# Patient Record
Sex: Female | Born: 1935 | Race: White | Hispanic: No | Smoking: Former smoker
Health system: Southern US, Community
[De-identification: ages and names within clinical notes are randomized; demographics above are authoritative.]

## PROBLEM LIST (undated history)

## (undated) DIAGNOSIS — I480 Paroxysmal atrial fibrillation: Secondary | ICD-10-CM

## (undated) DIAGNOSIS — H3530 Unspecified macular degeneration: Secondary | ICD-10-CM

## (undated) DIAGNOSIS — Z79899 Other long term (current) drug therapy: Secondary | ICD-10-CM

## (undated) DIAGNOSIS — F32A Depression, unspecified: Secondary | ICD-10-CM

## (undated) DIAGNOSIS — F419 Anxiety disorder, unspecified: Secondary | ICD-10-CM

## (undated) DIAGNOSIS — E785 Hyperlipidemia, unspecified: Secondary | ICD-10-CM

## (undated) DIAGNOSIS — I639 Cerebral infarction, unspecified: Secondary | ICD-10-CM

## (undated) DIAGNOSIS — I10 Essential (primary) hypertension: Secondary | ICD-10-CM

## (undated) DIAGNOSIS — D649 Anemia, unspecified: Secondary | ICD-10-CM

## (undated) DIAGNOSIS — E039 Hypothyroidism, unspecified: Secondary | ICD-10-CM

## (undated) DIAGNOSIS — I2510 Atherosclerotic heart disease of native coronary artery without angina pectoris: Secondary | ICD-10-CM

## (undated) DIAGNOSIS — F329 Major depressive disorder, single episode, unspecified: Secondary | ICD-10-CM

## (undated) DIAGNOSIS — I5042 Chronic combined systolic (congestive) and diastolic (congestive) heart failure: Secondary | ICD-10-CM

## (undated) HISTORY — DX: Hyperlipidemia, unspecified: E78.5

## (undated) HISTORY — DX: Cerebral infarction, unspecified: I63.9

## (undated) HISTORY — PX: CORONARY ARTERY BYPASS GRAFT: SHX141

## (undated) HISTORY — DX: Essential (primary) hypertension: I10

## (undated) HISTORY — DX: Anxiety disorder, unspecified: F41.9

## (undated) HISTORY — DX: Depression, unspecified: F32.A

## (undated) HISTORY — PX: CARDIAC SURGERY: SHX584

## (undated) HISTORY — DX: Atherosclerotic heart disease of native coronary artery without angina pectoris: I25.10

## (undated) HISTORY — DX: Anemia, unspecified: D64.9

## (undated) HISTORY — DX: Chronic combined systolic (congestive) and diastolic (congestive) heart failure: I50.42

## (undated) HISTORY — PX: ABDOMINAL HYSTERECTOMY: SHX81

## (undated) HISTORY — DX: Other long term (current) drug therapy: Z79.899

## (undated) HISTORY — DX: Major depressive disorder, single episode, unspecified: F32.9

## (undated) HISTORY — DX: Paroxysmal atrial fibrillation: I48.0

---

## 1999-02-16 ENCOUNTER — Encounter: Payer: Self-pay | Admitting: Cardiology

## 1999-02-16 ENCOUNTER — Inpatient Hospital Stay (HOSPITAL_COMMUNITY): Admission: EM | Admit: 1999-02-16 | Discharge: 1999-02-21 | Payer: Self-pay | Admitting: Cardiology

## 1999-02-17 ENCOUNTER — Encounter: Payer: Self-pay | Admitting: Cardiology

## 1999-02-19 ENCOUNTER — Encounter: Payer: Self-pay | Admitting: Cardiology

## 2000-05-12 ENCOUNTER — Encounter: Payer: Self-pay | Admitting: Cardiology

## 2000-05-12 ENCOUNTER — Inpatient Hospital Stay (HOSPITAL_COMMUNITY): Admission: EM | Admit: 2000-05-12 | Discharge: 2000-05-14 | Payer: Self-pay | Admitting: Cardiology

## 2002-06-12 ENCOUNTER — Encounter: Payer: Self-pay | Admitting: Emergency Medicine

## 2002-06-12 ENCOUNTER — Inpatient Hospital Stay (HOSPITAL_COMMUNITY): Admission: EM | Admit: 2002-06-12 | Discharge: 2002-06-14 | Payer: Self-pay | Admitting: Emergency Medicine

## 2004-09-29 ENCOUNTER — Ambulatory Visit: Payer: Self-pay | Admitting: Cardiology

## 2004-09-30 ENCOUNTER — Ambulatory Visit: Payer: Self-pay | Admitting: Cardiology

## 2006-07-29 ENCOUNTER — Inpatient Hospital Stay (HOSPITAL_COMMUNITY): Admission: RE | Admit: 2006-07-29 | Discharge: 2006-08-15 | Payer: Self-pay | Admitting: Cardiology

## 2006-07-29 ENCOUNTER — Encounter: Payer: Self-pay | Admitting: Vascular Surgery

## 2007-07-12 ENCOUNTER — Inpatient Hospital Stay (HOSPITAL_COMMUNITY): Admission: EM | Admit: 2007-07-12 | Discharge: 2007-07-13 | Payer: Self-pay | Admitting: Emergency Medicine

## 2009-07-08 ENCOUNTER — Ambulatory Visit: Payer: Self-pay | Admitting: Cardiology

## 2010-09-01 ENCOUNTER — Ambulatory Visit: Payer: Self-pay | Admitting: Cardiology

## 2010-12-05 ENCOUNTER — Ambulatory Visit (INDEPENDENT_AMBULATORY_CARE_PROVIDER_SITE_OTHER): Payer: Medicare Other | Admitting: Cardiology

## 2010-12-05 DIAGNOSIS — I252 Old myocardial infarction: Secondary | ICD-10-CM

## 2010-12-05 DIAGNOSIS — E789 Disorder of lipoprotein metabolism, unspecified: Secondary | ICD-10-CM

## 2010-12-05 DIAGNOSIS — R42 Dizziness and giddiness: Secondary | ICD-10-CM

## 2010-12-25 ENCOUNTER — Emergency Department (HOSPITAL_COMMUNITY): Payer: Medicare Other

## 2010-12-25 ENCOUNTER — Inpatient Hospital Stay (HOSPITAL_COMMUNITY)
Admission: EM | Admit: 2010-12-25 | Discharge: 2010-12-31 | DRG: 308 | Disposition: A | Discharge status: Home Health | Payer: Medicare Other | Attending: Internal Medicine | Admitting: Internal Medicine

## 2010-12-25 DIAGNOSIS — I248 Other forms of acute ischemic heart disease: Secondary | ICD-10-CM | POA: Diagnosis present

## 2010-12-25 DIAGNOSIS — R4182 Altered mental status, unspecified: Secondary | ICD-10-CM

## 2010-12-25 DIAGNOSIS — I10 Essential (primary) hypertension: Secondary | ICD-10-CM | POA: Diagnosis present

## 2010-12-25 DIAGNOSIS — I2489 Other forms of acute ischemic heart disease: Secondary | ICD-10-CM | POA: Diagnosis present

## 2010-12-25 DIAGNOSIS — Z7982 Long term (current) use of aspirin: Secondary | ICD-10-CM

## 2010-12-25 DIAGNOSIS — I2510 Atherosclerotic heart disease of native coronary artery without angina pectoris: Secondary | ICD-10-CM | POA: Diagnosis present

## 2010-12-25 DIAGNOSIS — I4891 Unspecified atrial fibrillation: Secondary | ICD-10-CM

## 2010-12-25 DIAGNOSIS — F0390 Unspecified dementia without behavioral disturbance: Secondary | ICD-10-CM | POA: Diagnosis present

## 2010-12-25 DIAGNOSIS — I959 Hypotension, unspecified: Secondary | ICD-10-CM | POA: Diagnosis present

## 2010-12-25 DIAGNOSIS — Z951 Presence of aortocoronary bypass graft: Secondary | ICD-10-CM

## 2010-12-25 DIAGNOSIS — E785 Hyperlipidemia, unspecified: Secondary | ICD-10-CM | POA: Diagnosis present

## 2010-12-25 DIAGNOSIS — Z23 Encounter for immunization: Secondary | ICD-10-CM

## 2010-12-25 DIAGNOSIS — J189 Pneumonia, unspecified organism: Secondary | ICD-10-CM | POA: Diagnosis present

## 2010-12-25 DIAGNOSIS — R197 Diarrhea, unspecified: Secondary | ICD-10-CM | POA: Diagnosis present

## 2010-12-25 DIAGNOSIS — R404 Transient alteration of awareness: Secondary | ICD-10-CM | POA: Diagnosis present

## 2010-12-25 DIAGNOSIS — F411 Generalized anxiety disorder: Secondary | ICD-10-CM | POA: Diagnosis present

## 2010-12-25 DIAGNOSIS — F329 Major depressive disorder, single episode, unspecified: Secondary | ICD-10-CM | POA: Diagnosis present

## 2010-12-25 DIAGNOSIS — F3289 Other specified depressive episodes: Secondary | ICD-10-CM | POA: Diagnosis present

## 2010-12-25 LAB — CBC
MCHC: 32.4 g/dL (ref 30.0–36.0)
MCV: 86.8 fL (ref 78.0–100.0)
Platelets: 167 10*3/uL (ref 150–400)
RDW: 14.1 % (ref 11.5–15.5)
WBC: 14.3 10*3/uL — ABNORMAL HIGH (ref 4.0–10.5)

## 2010-12-25 LAB — TROPONIN I: Troponin I: 0.06 ng/mL (ref 0.00–0.06)

## 2010-12-25 LAB — MAGNESIUM: Magnesium: 2 mg/dL (ref 1.5–2.5)

## 2010-12-25 LAB — POCT CARDIAC MARKERS
CKMB, poc: 1.4 ng/mL (ref 1.0–8.0)
Troponin i, poc: <0.05 ng/mL (ref 0.00–0.09)

## 2010-12-25 LAB — URINALYSIS, ROUTINE W REFLEX MICROSCOPIC
Bilirubin Urine: NEGATIVE
Ketones, ur: NEGATIVE mg/dL
Protein, ur: NEGATIVE mg/dL
Urine Glucose, Fasting: NEGATIVE mg/dL

## 2010-12-25 LAB — BRAIN NATRIURETIC PEPTIDE: Pro B Natriuretic peptide (BNP): 275 pg/mL — ABNORMAL HIGH (ref 0.0–100.0)

## 2010-12-25 LAB — DIFFERENTIAL
Basophils Absolute: 0 10*3/uL (ref 0.0–0.1)
Eosinophils Absolute: 0.1 10*3/uL (ref 0.0–0.7)
Eosinophils Relative: 1 % (ref 0–5)

## 2010-12-25 LAB — POCT I-STAT, CHEM 8
Chloride: 107 mEq/L (ref 96–112)
HCT: 38 % (ref 36.0–46.0)
Potassium: 4.2 mEq/L (ref 3.5–5.1)
Sodium: 140 mEq/L (ref 135–145)

## 2010-12-25 LAB — GLUCOSE, CAPILLARY: Glucose-Capillary: 142 mg/dL — ABNORMAL HIGH (ref 70–99)

## 2010-12-25 LAB — PROTIME-INR: INR: 0.94 (ref 0.00–1.49)

## 2010-12-26 DIAGNOSIS — I4891 Unspecified atrial fibrillation: Secondary | ICD-10-CM

## 2010-12-26 LAB — COMPREHENSIVE METABOLIC PANEL
ALT: 27 U/L (ref 0–35)
Alkaline Phosphatase: 70 U/L (ref 39–117)
BUN: 15 mg/dL (ref 6–23)
CO2: 25 mEq/L (ref 19–32)
GFR calc non Af Amer: >60 mL/min (ref >60)
Glucose, Bld: 95 mg/dL (ref 70–99)
Potassium: 3.8 mEq/L (ref 3.5–5.1)
Total Bilirubin: 0.7 mg/dL (ref 0.3–1.2)
Total Protein: 5.1 g/dL — ABNORMAL LOW (ref 6.0–8.3)

## 2010-12-26 LAB — CBC
MCV: 87.2 fL (ref 78.0–100.0)
Platelets: 148 10*3/uL — ABNORMAL LOW (ref 150–400)
RBC: 3.97 MIL/uL (ref 3.87–5.11)
RDW: 14.1 % (ref 11.5–15.5)
WBC: 8.8 10*3/uL (ref 4.0–10.5)

## 2010-12-26 LAB — URINE CULTURE
Colony Count: NO GROWTH
Culture  Setup Time: 201202232018
Culture: NO GROWTH

## 2010-12-26 LAB — DIFFERENTIAL
Basophils Absolute: 0 10*3/uL (ref 0.0–0.1)
Basophils Relative: 0 % (ref 0–1)
Eosinophils Absolute: 0.1 10*3/uL (ref 0.0–0.7)
Lymphs Abs: 1.6 10*3/uL (ref 0.7–4.0)
Neutrophils Relative %: 75 % (ref 43–77)

## 2010-12-26 LAB — LIPID PANEL
HDL: 34 mg/dL — ABNORMAL LOW (ref >39)
LDL Cholesterol: 75 mg/dL (ref 0–99)
Total CHOL/HDL Ratio: 3.6 RATIO
VLDL: 15 mg/dL (ref 0–40)

## 2010-12-26 LAB — TSH: TSH: 0.953 u[IU]/mL (ref 0.350–4.500)

## 2010-12-26 LAB — CARDIAC PANEL(CRET KIN+CKTOT+MB+TROPI)
Relative Index: INVALID (ref 0.0–2.5)
Total CK: 79 U/L (ref 7–177)

## 2010-12-26 LAB — LACTIC ACID, PLASMA: Lactic Acid, Venous: 0.7 mmol/L (ref 0.5–2.2)

## 2010-12-26 LAB — PROCALCITONIN: Procalcitonin: <0.1 ng/mL

## 2010-12-26 NOTE — Consult Note (Signed)
[name], STATZER NO.:  [id number]  MEDICAL RECORD NO.:  [id number]           PATIENT TYPE:  I  LOCATION:  4736                         FACILITY:  [facility]  PHYSICIAN:  Harlon Flor, MD   DATE OF BIRTH:  [birth year]  DATE OF CONSULTATION: DATE OF DISCHARGE:                                CONSULTATION   REQUESTING PHYSICIAN:  Dr. Rosetta Posner.  PRIMARY CARDIOLOGIST:  Cassell Clement, MD  REASON FOR CONSULTATION:  Atrial fibrillation.  CHIEF COMPLAINT:  Altered mental status.  HISTORY OF PRESENT ILLNESS:  [name] is an 77 year old white female with history of coronary disease who was brought to the hospital via EMS today with altered mental status and diarrhea and atrial fibrillation. She is confused and unable to give much history.  I have attempted to contact family but have been unsuccessful.  She states that she lives at home alone and that she is the one that called 911, but she cannot tell me the date or where she lives.  It appears on arrival she was in AFib with RVR that improved with IV diltiazem and is now rate controlled. She was somewhat hypotensive that improved with fluids.  She was found to have a lingular infiltrate on chest x-ray.  She has not had chest pain as far as I can tell, and her cardiac enzymes were negative.  She does have a history of postop atrial fibrillation after her CABG and was treated for short time with amiodarone and it appears this resolved and has not required subsequent treatment.  It is not clear how long she had been having diarrhea, but she has had a few episodes while in the emergency room.  PAST MEDICAL HISTORY: 1. Coronary artery disease:  Bypass surgery in 1999 with a redo bypass     surgery in 2007 per Dr. Laneta Simmers with vein graft to the OM and vein     graft to the distal RCA and vein graft to the right coronary     marginal branch. 2. History of postop AFib in 2007, treated for short term with  amiodarone. 3. Hypertension. 4. Hyperlipidemia. 5. Anxiety and depression.  HOME MEDICATIONS:  The meds that she has with her today are: 1. Lipitor 20 mg daily. 2. Sublingual nitro p.r.n. 3. Xanax 0.25 mg b.i.d. as needed. 4. Lasix 20 mg daily. 5. Imdur 60 mg at bedtime. 6. Diazepam 10 mg at bedtime. 7. Metoprolol tartrate 25 mg b.i.d.  ALLERGIES:  Reportedly no known drug allergies.  SOCIAL HISTORY:  It appears that she lives alone and was a former smoker but is no longer smoking.  She does not drink alcohol per records but I am unable to obtain this history from her.  FAMILY HISTORY:  Unable to obtain due to altered mental status.  REVIEW OF SYSTEMS:  Unable to obtain due to altered mental status.  PHYSICAL EXAMINATION:  VITAL SIGNS:  Blood pressure 118/52, temperature 98.7, pulse 96, respirations 18, O2 at 95% on room air. GENERAL:  In no acute distress. HEENT:  Extraocular movements are intact.  Oropharynx is benign. NECK:  Supple. CARDIOVASCULAR:  Irregularly  irregular.  No obvious murmurs.  S1 and S2. No jugular venous distention. LUNGS:  Clear to auscultation bilaterally. ABDOMEN:  Soft, nontender.  No hepatosplenomegaly. EXTREMITIES:  No edema. PULSES:  Intact dorsalis pedis, posterior tibial, and radial bilaterally. NEUROLOGIC:  She is oriented to person but not place or time.  She moves all extremities well. SKIN:  No rashes. LYMPH NODES:  No lymphadenopathy.  EKG shows atrial fibrillation with RVR, cannot rule out anterior MI previously, poor quality, we need to repeat study.  Her previous EKG showed normal sinus rhythm with septal Q-waves.  Chest x-ray shows a left lingular pneumonia.  Urinalysis is clear.  INR is 0.94.  Point-of-care troponin is normal. CK-MB is 1.4.  White count is 14 with 86% neutrophils.  Hemoglobin is 12.6, platelets are 167.  ASSESSMENT AND PLAN:  [name] is a pleasant 77 year old white female who presents to the emergency room  with altered mental status, diarrhea, and a left lingular pneumonia as well as atrial fibrillation that is now fairly rate controlled.  She is being admitted to the hospital service for treatment of her altered mental status, pneumonia, and diarrhea. 1. Atrial fibrillation:  She had this previously after her bypass     surgery.  For now, we will rate control with her metoprolol.  We     would recommend increasing to 25 mg q.6 h. as long as her blood     pressure tolerates this okay.  She does have rapid ventricular rate     again, we can start a low-dose diltiazem drip.  We will also     recommend checking a TSH and a 2-D echocardiogram in the morning.     After she gets over her acute illness, we will discuss risk and     benefit of systemic anticoagulation with Coumadin. 2. Pneumonia with altered mental status and diarrhea will be treated     with antibiotics and hydration per the hospitalist service. 3. CAD: No current chest pain.  Rule out for AMI with serial enzymes and EKG.       Continue statin and ASA 325.    Please call with any questions.     Harlon Flor, MD     MMB/MEDQ  D:  12/25/2010  T:  12/26/2010  Job:  [id number]  cc:   Cassell Clement, M.D.  Electronically Signed by Meridee Score MD on 12/26/2010 02:18:20 PM

## 2010-12-27 ENCOUNTER — Inpatient Hospital Stay (HOSPITAL_COMMUNITY): Payer: Medicare Other

## 2010-12-27 DIAGNOSIS — I059 Rheumatic mitral valve disease, unspecified: Secondary | ICD-10-CM

## 2010-12-27 LAB — BASIC METABOLIC PANEL
BUN: 10 mg/dL (ref 6–23)
CO2: 27 mEq/L (ref 19–32)
Calcium: 8.1 mg/dL — ABNORMAL LOW (ref 8.4–10.5)
GFR calc non Af Amer: >60 mL/min (ref >60)
Glucose, Bld: 95 mg/dL (ref 70–99)
Sodium: 140 mEq/L (ref 135–145)

## 2010-12-27 LAB — CBC
HCT: 35.3 % — ABNORMAL LOW (ref 36.0–46.0)
Hemoglobin: 11.1 g/dL — ABNORMAL LOW (ref 12.0–15.0)
MCH: 27.4 pg (ref 26.0–34.0)
MCHC: 31.4 g/dL (ref 30.0–36.0)
MCV: 87.2 fL (ref 78.0–100.0)
RDW: 14.4 % (ref 11.5–15.5)

## 2010-12-28 LAB — PROTIME-INR: INR: 1.1 (ref 0.00–1.49)

## 2010-12-29 ENCOUNTER — Inpatient Hospital Stay (HOSPITAL_COMMUNITY): Payer: Medicare Other

## 2010-12-29 LAB — STOOL CULTURE

## 2010-12-29 LAB — CBC
HCT: 33.5 % — ABNORMAL LOW (ref 36.0–46.0)
MCV: 85.9 fL (ref 78.0–100.0)
Platelets: 154 10*3/uL (ref 150–400)
RBC: 3.9 MIL/uL (ref 3.87–5.11)
RDW: 14.4 % (ref 11.5–15.5)
WBC: 8.2 10*3/uL (ref 4.0–10.5)

## 2010-12-29 LAB — CLOSTRIDIUM DIFFICILE BY PCR: Toxigenic C. Difficile by PCR: NEGATIVE

## 2010-12-29 LAB — PROTIME-INR: INR: 1.19 (ref 0.00–1.49)

## 2010-12-30 DIAGNOSIS — R079 Chest pain, unspecified: Secondary | ICD-10-CM

## 2010-12-30 LAB — BASIC METABOLIC PANEL
BUN: 8 mg/dL (ref 6–23)
Calcium: 8.5 mg/dL (ref 8.4–10.5)
Creatinine, Ser: 0.83 mg/dL (ref 0.4–1.2)
GFR calc non Af Amer: >60 mL/min (ref >60)
Glucose, Bld: 104 mg/dL — ABNORMAL HIGH (ref 70–99)

## 2010-12-30 LAB — DIFFERENTIAL
Eosinophils Absolute: 0.3 10*3/uL (ref 0.0–0.7)
Eosinophils Relative: 5 % (ref 0–5)
Lymphocytes Relative: 16 % (ref 12–46)
Lymphs Abs: 1.1 10*3/uL (ref 0.7–4.0)
Monocytes Absolute: 0.6 10*3/uL (ref 0.1–1.0)

## 2010-12-30 LAB — CBC
HCT: 34.7 % — ABNORMAL LOW (ref 36.0–46.0)
MCH: 28.2 pg (ref 26.0–34.0)
MCHC: 33.1 g/dL (ref 30.0–36.0)
MCV: 85 fL (ref 78.0–100.0)
Platelets: 147 10*3/uL — ABNORMAL LOW (ref 150–400)
RDW: 14.1 % (ref 11.5–15.5)

## 2010-12-30 LAB — CARDIAC PANEL(CRET KIN+CKTOT+MB+TROPI)
CK, MB: 4.3 ng/mL — ABNORMAL HIGH (ref 0.3–4.0)
Relative Index: 3.7 — ABNORMAL HIGH (ref 0.0–2.5)
Relative Index: 3.4 — ABNORMAL HIGH (ref 0.0–2.5)
Troponin I: 0.01 ng/mL (ref 0.00–0.06)

## 2011-01-08 NOTE — H&P (Signed)
[name], [name]              ACCOUNT NO.:  [id number]  MEDICAL RECORD NO.:  [id number]           PATIENT TYPE:  I  LOCATION:  4736                         FACILITY:  [facility]  PHYSICIAN:  Lonia Blood, M.D.      DATE OF BIRTH:  02/12/1926  DATE OF ADMISSION:  12/25/2010 DATE OF DISCHARGE:                             HISTORY & PHYSICAL   PRIMARY CARE PROVIDER:  Cassell Clement, MD  PRESENTING COMPLAINT:  Altered mental status and syncope.  HISTORY OF PRESENT ILLNESS:  The patient is an 77 year old female with known medical history of coronary artery disease among other things mainly being admitted by Dr. Patty Sermons for all her previous admissions. She was brought in today by family who are currently not available secondary to altered mental status.  The patient is entirely confused, unable to give any meaningful history.  It was; however, reported that she was having a lot of diarrhea at home.  No evidence of chest pain. No shortness of breath, but she passed out after she had some vomiting. When EMS arrived at her apartment, she apparently was in AFib with a rate of about 160.  She was given 500 mL bolus of normal saline, also 20 mg IV Cardizem.  When she arrived at the ED, her heart rate was down between 60 and 90, but she was still in AFib.  The patient had no documented prior AFib and as far as we can tell, although there is question of she may have had transient AFib couple of years ago.  The history as indicated is mainly from family as the patient is so confused, cannot say much.  Other history is obtained from prior records, which dates back to 2008.  PAST MEDICAL HISTORY:  Significant for coronary artery disease.  She is status post coronary artery bypass grafting in 1989.  She had a redo bypass graft in August 04, 2006.  She has history of hypertension, hyperlipidemia, anxiety disorder, depression.  ALLERGIES:  She has no known drug allergies.  CURRENT  MEDICATIONS: 1. Atorvastatin 20 mg daily. 2. Nitroglycerin transdermal 0.2 mg daily. 3. Alprazolam 0.25 mg b.i.d. 4. Lasix 20 mg daily. 5. Isosorbide mononitrate 60 mg at bedtime. 6. Diazepam 10 mg at bedtime. 7. Metoprolol succinate 25 mg b.i.d.  SOCIAL HISTORY:  The patient apparently lives alone.  She is widowed. She lives in [place].  She is able to do some ADLs, although her neighbor helps out for the most part.  She is a former smoker but quit many years ago.  No alcohol.  No IV drug use.  FAMILY HISTORY:  Significant for coronary artery disease and strokes.  REVIEW OF SYSTEMS:  Currently, unobtainable as the patient is not cooperative and confused.  PHYSICAL EXAMINATION:  VITAL SIGNS:  Temperature is 98.7, blood pressure 180/52, her pulse between 90 and 100, respiratory 18, sats 95% room air. GENERAL:  She is awake, alert but confused, not able to give history. She is oriented in person only. HEENT:  PERRL.  EOMI.  No pallor.  No jaundice.  No rhinorrhea. NECK:  Supple.  No JVD.  No lymphadenopathy.  RESPIRATORY:  She has good air entry bilaterally.  No wheezes, no rales. No crackles. CARDIOVASCULAR SYSTEM:  She has irregularly irregular rhythm. ABDOMEN:  Soft, full, nontender with positive bowel sounds. EXTREMITIES:  No edema, cyanosis or clubbing.  LABORATORY DATA:  Sodium is 140, potassium 4.2, chloride 107, BUN 20, creatinine 1.1, glucose 128, calcium 1.09.  Initial cardiac enzymes are all negative.  White count is 14.3, hemoglobin 12.6, platelet count 167 with a mild left shift, ANC of 12.3.  PT 12.8, INR 0.94.  Urinalysis is negative.  Chest x-ray showed potentially lingular pneumonia.  Head CT without contrast showed negative for bleed or other acute process, sphenoid sinus disease.  Her BNP is 275.  EKG showed atrial fibrillation with a rate of 95.  No significant ST-T wave changes.  ASSESSMENT:  This is an 77 year old female presented with altered  mental status probably secondary to acute medical illness.  She seemed to have a new atrial fibrillation with mild right rapid ventricular response and also lingular pneumonia.  The patient had diarrhea which may be related to her pneumonia.  She has also has some hypotension, which responded to fluids here.  PLAN: 1. Altered mental status.  We will admit the patient where she could     be closely monitored.  We will treat her medical problems, follow     her mental status closely.  Once he is more awake and able to give     more history, we will obtain the history.  Her CT is negative and I     think this is acute delirium. 2. Pneumonia.  I will start her on empiric antibiotics and oxygen.     She is afebrile at this point but we will watch her closely. 3. Diarrhea.  Again, this may be related to pneumonia.  However, I     will check stool studies including C. diff assay. 4. History of coronary artery disease.  We will cycle her enzymes,     although it does not seem like she has any acute coronary syndrome. 5. New atrial fibrillation.  This may be due to ongoing acute     pneumonia.  Her rate seems controlled now we will put back on beta-     blockers.  She is not a candidate for acute Coumadin at this stage     probably due to risk of falls.  We will defer to Cardiology for     decision on whether or not she needs to be anticoagulated.  Based     on CHADS2 criteria, the patient qualifies for anticoagulation,     unless there is contraindication like falls. 6. Dyslipidemia.  We will continue with statin as soon as possible. 7. Depression and anxiety.  At this point, I will hold diazepam and     alprazolam in the setting of the patient's confused stage. 8. Hypertension.  Again, blood pressure seems low at this point, so we     will hold antihypertensives for now.  Further treatment depend on     the patient's response to these initial measures.     Lonia Blood,  M.D.    [name]  D:  12/26/2010  T:  12/26/2010  Job:  [id number]  Electronically Signed by Lonia Blood M.D. on 01/08/2011 06:32:33 AM

## 2011-01-15 NOTE — Discharge Summary (Signed)
[name], [name]              ACCOUNT NO.:  [id number]  MEDICAL RECORD NO.:  [id number]           PATIENT TYPE:  I  LOCATION:  4736                         FACILITY:  [facility]  PHYSICIAN:  Zannie Cove, MD     DATE OF BIRTH:  [birth year]  DATE OF ADMISSION:  12/25/2010 DATE OF DISCHARGE:  12/31/2010                              DISCHARGE SUMMARY   PRIMARY CARDIOLOGIST:  Cassell Clement, MD  DISCHARGE DIAGNOSES: 1. Atrial fibrillation with rapid ventricular response. 2. Lingular pneumonia. 3. History of coronary artery disease status post coronary artery     bypass graft. 4. Mild elevated cardiac enzymes, felt to be secondary to demand     ischemia. 5. Dementia with sundowning episodes. 6. Hypertension. 7. Dyslipidemia. 8. Anxiety disorder. 9. Depression.  DISCHARGE MEDICATIONS: 1. Tylenol 650 mg q.4 hours p.r.n. 2. Amiodarone 400 mg p.o. q.a.m. and 200 mg p.o. q.p.m. 3. Aspirin 81 mg daily. 4. Plavix 75 mg daily. 5. Diltiazem CD 180 mg daily. 6. Alprazolam 0.25 mg p.o. b.i.d. p.r.n. 7. Atorvastatin 20 mg p.o. daily. 8. Lasix 20 mg p.o. every Monday, Wednesday, and Friday. 9. Iron 325 mg p.o. daily. 10.Isosorbide mononitrate 60-mg tablet 1/2 tablet p.o. at bedtime. 11.Toprol-XL 25 mg p.o. b.i.d. 12.Vitamin C over the counter 1 tab daily. 13.Vitamin D over the counter 1 tab daily.  CONSULTANTS:  Cassell Clement, MD.  DIAGNOSTICS AND INVESTIGATIONS: 1. Chest x-ray, December 25, 2010, potential lingula lingular     pneumonia. 2. CT of the head, December 25, 2010, negative for bleed or acute     intracranial process, sphenoid sinus disease. 3. KUB, December 27, 2010, gas within the intestine but no evidence of     ileus or obstruction or free air. 4. Chest x-ray, December 29, 2010, persistent left basilar airspace     disease, pneumonia could have this appearance.  HOSPITAL COURSE:  Ms. [name] is an 77 year old white female with history of coronary artery  disease status post CABG and paroxysmal atrial fibrillation, presented to the hospital with altered mental status and syncope.  1. Atrial fibrillation with RVR.  On initial evaluation in the ER, she     was found to be in rapid ventricular response secondary to atrial     fibrillation and lingular pneumonia.  For this, she was treated     with IV diltiazem drip initially, subsequently transitioned to p.o.     diltiazem.  Since then, her rate has been recently controlled with     occasional transients spikes in her heart rate and has been     fluctuating between normal sinus rhythm and paroxysmal atrial     fibrillation.  Subsequently, amiodarone was added by Dr. Patty Sermons     for better optimal rate control.  She is not felt to be a Coumadin     candidate although she has received it transiently at home after     her bypass surgery.  Due to her risk of falls and dementia and the     fact that she lives alone, aspirin was felt to be the most safest     alternative in her  case. 2. Lingular pneumonia, received 6-day course of Rocephin and Zithromax     for this.  We will discontinue antibiotics today. 3. Dementia with occasional sundowning episodes.  She was not found to     have any acute intracranial abnormality, which was felt to be     iatrogenic due to hospitalization as well as current infection     causing pneumonia and her rapid atrial fibrillation.  This has     subsequently resolved, however, she did have an episode of delirium     2 days ago and has been stable since then.  Apparently, this has     been happening at home off and on for 7 years now. 4. Coronary artery disease, stable.  She did have mild bump in her CK-     MB initially when she was in rapid ventricular rate.  It was felt     to be secondary to demand and hence Plavix was added to her regimen     by Dr. Patty Sermons for optimal medical management. 5. Anxiety and depression, stable.  DISPOSITION:  The patient was  recommended to go to a skilled nursing facility due to her dementia and occasional sundowning episodes. However, she adamantly declined this and hence is being sent home with home health RN as well as physical therapy.     Zannie Cove, MD     PJ/MEDQ  D:  12/31/2010  T:  01/01/2011  Job:  [id number]  cc:   Cassell Clement, M.D.  Electronically Signed by Zannie Cove  on 01/15/2011 05:18:37 PM

## 2011-01-22 ENCOUNTER — Other Ambulatory Visit: Payer: Self-pay | Admitting: *Deleted

## 2011-01-22 DIAGNOSIS — I25810 Atherosclerosis of coronary artery bypass graft(s) without angina pectoris: Secondary | ICD-10-CM

## 2011-01-22 DIAGNOSIS — I4891 Unspecified atrial fibrillation: Secondary | ICD-10-CM

## 2011-01-22 MED ORDER — DILTIAZEM HCL ER BEADS 180 MG PO CP24: 180.0000 mg | ORAL_CAPSULE | Freq: Every day | ORAL | Status: DC

## 2011-01-22 MED ORDER — CLOPIDOGREL BISULFATE 75 MG PO TABS: 75.0000 mg | ORAL_TABLET | Freq: Every day | ORAL | Status: DC

## 2011-01-22 MED ORDER — AMIODARONE HCL 200 MG PO TABS: 200.0000 mg | ORAL_TABLET | Freq: Every day | ORAL | Status: DC

## 2011-01-22 NOTE — Telephone Encounter (Signed)
Refilled meds per fax request.  

## 2011-01-23 ENCOUNTER — Telehealth: Payer: Self-pay | Admitting: *Deleted

## 2011-01-23 DIAGNOSIS — G4700 Insomnia, unspecified: Secondary | ICD-10-CM

## 2011-01-23 DIAGNOSIS — F419 Anxiety disorder, unspecified: Secondary | ICD-10-CM

## 2011-01-23 MED ORDER — ALPRAZOLAM 0.25 MG PO TABS: 0.2500 mg | ORAL_TABLET | Freq: Two times a day (BID) | ORAL | Status: AC | PRN

## 2011-01-23 MED ORDER — TEMAZEPAM 15 MG PO CAPS: 15.0000 mg | ORAL_CAPSULE | Freq: Every evening | ORAL | Status: DC | PRN

## 2011-01-23 NOTE — Telephone Encounter (Signed)
Patients niece phoned requesting rx refill for xanax.  Also said patient had not been sleeping secondary to discontinuing valium.  Will rx temazepam 15 mg hs prn.  Rx called to patients pharmacy.

## 2011-01-23 NOTE — Telephone Encounter (Signed)
Message copied by Regis Bill on Fri Jan 23, 2011  4:10 PM ------      Message from: Sheffield Slider      Created: Fri Jan 23, 2011  8:27 AM      Regarding: MED      Contact: [phone]       CALL KAY ABOUT PT'S ZANAX. CHART IN BOX.  [id number]

## 2011-01-23 NOTE — Telephone Encounter (Signed)
Agree with plan 

## 2011-01-30 ENCOUNTER — Encounter: Payer: Self-pay | Admitting: Cardiology

## 2011-02-02 ENCOUNTER — Ambulatory Visit (INDEPENDENT_AMBULATORY_CARE_PROVIDER_SITE_OTHER): Payer: Medicare Other | Admitting: Cardiology

## 2011-02-02 ENCOUNTER — Encounter: Payer: Self-pay | Admitting: Cardiology

## 2011-02-02 DIAGNOSIS — D649 Anemia, unspecified: Secondary | ICD-10-CM

## 2011-02-02 DIAGNOSIS — I4891 Unspecified atrial fibrillation: Secondary | ICD-10-CM

## 2011-02-02 DIAGNOSIS — E7800 Pure hypercholesterolemia, unspecified: Secondary | ICD-10-CM

## 2011-02-02 DIAGNOSIS — F329 Major depressive disorder, single episode, unspecified: Secondary | ICD-10-CM | POA: Insufficient documentation

## 2011-02-02 DIAGNOSIS — I259 Chronic ischemic heart disease, unspecified: Secondary | ICD-10-CM

## 2011-02-02 DIAGNOSIS — F419 Anxiety disorder, unspecified: Secondary | ICD-10-CM

## 2011-02-02 DIAGNOSIS — F32A Depression, unspecified: Secondary | ICD-10-CM | POA: Insufficient documentation

## 2011-02-02 DIAGNOSIS — T82218A Other mechanical complication of coronary artery bypass graft, initial encounter: Secondary | ICD-10-CM

## 2011-02-02 DIAGNOSIS — I480 Paroxysmal atrial fibrillation: Secondary | ICD-10-CM

## 2011-02-02 MED ORDER — AMIODARONE HCL 200 MG PO TABS: 200.0000 mg | ORAL_TABLET | ORAL | Status: DC

## 2011-02-02 NOTE — Progress Notes (Signed)
History of Present Illness: This pleasant 77 year old woman is seen back for a post hospital office visit she has known coronary artery disease.  He had initial coronary artery bypass grafting in 1989.  She had a redo bypass graft in October 2007.  His had a history of hypertension hyperlipidemia anxiety disorder and depression.  She's had a history of paroxysmal atrial fibrillation.  She's had some problems with her memory.  She also has decreased vision as well as decreased hearing.  She is a widow with no children.  She has a niece who helps her stay in her own home and helps her keep up with her medication.  She was most recently hospitalized on 12/25/10.  She was in atrial fibrillation on admission but converted during the hospital stay.  She had an echocardiogram on 12/27/10 which showed ejection fraction 45% with akinesis of the basal inferior and inferoseptal myocardium and she had grade 2 diastolic dysfunction  Current Outpatient Prescriptions  Medication Sig Dispense Refill  . ALPRAZolam (XANAX) 0.25 MG tablet Take 1 tablet (0.25 mg total) by mouth 2 (two) times daily as needed for anxiety.  60 tablet  2  . amiodarone (PACERONE) 200 MG tablet Take 1 tablet (200 mg total) by mouth 1 dose over 46 hours.  30 tablet  5  . Ascorbic Acid (VITAMIN C) 100 MG tablet Take 100 mg by mouth daily.        Marland Kitchen aspirin 81 MG tablet Take 81 mg by mouth daily.        Marland Kitchen atorvastatin (LIPITOR) 20 MG tablet Take 20 mg by mouth daily.        . clopidogrel (PLAVIX) 75 MG tablet Take 1 tablet (75 mg total) by mouth daily.  30 tablet  5  . diltiazem (TIAZAC) 180 MG 24 hr capsule Take 1 capsule (180 mg total) by mouth daily.  30 capsule  5  . furosemide (LASIX) 20 MG tablet Take 20 mg by mouth daily. Taking on Mon. Wed. And Fri only      . isosorbide mononitrate (IMDUR) 60 MG 24 hr tablet Take 30 mg by mouth at bedtime.        . metoprolol succinate (TOPROL-XL) 25 MG 24 hr tablet Take 25 mg by mouth 2 (two) times daily.         . Multiple Vitamin (MULTIVITAMIN) tablet Take 1 tablet by mouth daily.        . nitroGLYCERIN (NITRODUR - DOSED IN MG/24 HR) 0.2 mg/hr Place 1 patch onto the skin daily.        . nitroGLYCERIN (NITROSTAT) 0.4 MG SL tablet Place 0.4 mg under the tongue as needed.        . temazepam (RESTORIL) 15 MG capsule Take 1 capsule (15 mg total) by mouth at bedtime as needed for sleep.  30 capsule  2  . VITAMIN D, CHOLECALCIFEROL, PO Take by mouth.        . DISCONTD: amiodarone (PACERONE) 200 MG tablet Take 1 tablet (200 mg total) by mouth daily.  30 tablet  5  . diazepam (VALIUM) 10 MG tablet Take 10 mg by mouth at bedtime.        Marland Kitchen escitalopram (LEXAPRO) 20 MG tablet Take 20 mg by mouth daily.        . pantoprazole (PROTONIX) 40 MG tablet Take 40 mg by mouth daily.          Allergies  Allergen Reactions  . Lopressor (Metoprolol Tartrate)  alopecia    Patient Active Problem List  Diagnoses  . Paroxysmal atrial fibrillation  . Ischemic heart disease  . Failed CABG (coronary artery bypass graft)  . Hypercholesterolemia  . Anxiety  . Depression  . Anemia    History  Smoking status  . Former Smoker  Smokeless tobacco  . Not on file    History  Alcohol Use No    No family history on file.  Review of Systems: Constitutional: no fever chills diaphoresis or fatigue or change in weight.  Head and neck: no hearing loss, no epistaxis, no photophobia or visual disturbance. Respiratory: No cough, shortness of breath or wheezing. Cardiovascular: No chest pain peripheral edema, palpitations. Gastrointestinal: No abdominal distention, no abdominal pain, no change in bowel habits hematochezia or melena. Genitourinary: No dysuria, no frequency, no urgency, no nocturia. Musculoskeletal:No arthralgias, no back pain, no gait disturbance or myalgias. Neurological: No dizziness, no headaches, no numbness, no seizures, no syncope, no weakness, no tremors. Hematologic: No lymphadenopathy, no  easy bruising. Psychiatric: No confusion, no hallucinations, no sleep disturbance.    Physical Exam: Filed Vitals:   02/02/11 1000  BP: 130/60  Pulse: 59  Her weight is 128.  This is down 7 pounds the general appearance reveals a thin elderly woman who appears younger than her stated age.  Integument reveals that she has a skin lesion on her nose for which she will see her dermatologist.Otherwise normal head and neck.  No lymphadenopathy.  Jugular venous pressure normal.  No carotid bruits.  The thyroid is not enlarged.The chest is clear to percussion and auscultation. There are no rales or rhonchi. Expansion of the chest is symmetrical.The precordium is quiet.  The first heart sound is normal.  The second heart sound is physiologically split.  There is no murmur gallop rub or click.  There is no abnormal lift or heave.  The breasts reveal no masses.The abdomen is soft and nontender. Bowel sounds are normal. The liver and spleen are not enlarged. There Are no abdominal masses. There are no bruits.The pedal pulses are good.  There is no phlebitis or edema.  There is no cyanosis or clubbing.Strength is normal and symmetrical in all extremities.  There is no lateralizing weakness.  There are no sensory deficits.   Assessment / Plan: Her electrocardiogram today confirms normal sinus rhythm and nonspecific ST-T wave abnormalities.  Recheck in 3 months for followup office visit chemistries CBC TSH and T4.  We are cutting down on her amiodarone from 600 mg a day to 200 mg daily.  She complains of feeling cold all the time and this may be from the amiodarone.

## 2011-02-27 ENCOUNTER — Emergency Department (HOSPITAL_COMMUNITY): Payer: Medicare Other

## 2011-02-27 ENCOUNTER — Emergency Department (HOSPITAL_COMMUNITY)
Admission: EM | Admit: 2011-02-27 | Discharge: 2011-02-27 | Disposition: A | Discharge status: Home / Self Care | Payer: Medicare Other | Attending: Emergency Medicine | Admitting: Emergency Medicine

## 2011-02-27 DIAGNOSIS — F0390 Unspecified dementia without behavioral disturbance: Secondary | ICD-10-CM | POA: Insufficient documentation

## 2011-02-27 DIAGNOSIS — Z79899 Other long term (current) drug therapy: Secondary | ICD-10-CM | POA: Insufficient documentation

## 2011-02-27 DIAGNOSIS — I252 Old myocardial infarction: Secondary | ICD-10-CM | POA: Insufficient documentation

## 2011-02-27 DIAGNOSIS — R079 Chest pain, unspecified: Secondary | ICD-10-CM | POA: Insufficient documentation

## 2011-02-27 DIAGNOSIS — E7800 Pure hypercholesterolemia, unspecified: Secondary | ICD-10-CM | POA: Insufficient documentation

## 2011-02-27 DIAGNOSIS — I10 Essential (primary) hypertension: Secondary | ICD-10-CM | POA: Insufficient documentation

## 2011-02-27 LAB — DIFFERENTIAL
Eosinophils Absolute: 0.2 10*3/uL (ref 0.0–0.7)
Lymphs Abs: 2.4 10*3/uL (ref 0.7–4.0)
Neutro Abs: 3.5 10*3/uL (ref 1.7–7.7)
Neutrophils Relative %: 53 % (ref 43–77)

## 2011-02-27 LAB — CBC
HCT: 33.6 % — ABNORMAL LOW (ref 36.0–46.0)
Hemoglobin: 11.2 g/dL — ABNORMAL LOW (ref 12.0–15.0)
MCH: 28.6 pg (ref 26.0–34.0)
MCHC: 33.3 g/dL (ref 30.0–36.0)
MCV: 85.9 fL (ref 78.0–100.0)
RDW: 14.3 % (ref 11.5–15.5)

## 2011-03-17 NOTE — H&P (Signed)
NAMEERON, GOBLE NO.:  [id number]   MEDICAL RECORD NO.:  [id number]          PATIENT TYPE:  EMS   LOCATION:  MAJO                         FACILITY:  [facility]   PHYSICIAN:  Cassell Clement, M.D. DATE OF BIRTH:  1926-07-08   DATE OF ADMISSION:  07/12/2007  DATE OF DISCHARGE:                              HISTORY & PHYSICAL   CHIEF COMPLAINT:  Chest pain.   HISTORY:  This is a 77 year old widowed Caucasian female who is admitted  through the emergency room with chest pain.  She does have known  coronary artery disease and a history of hyperlipidemia.  She had  initial coronary artery bypass graft surgery in 1989 and then she  underwent a redo CABG August 04, 2006, by Dr. Rexanne Mano.  At the time  of her redo procedure on August 04, 2006, she underwent a redo CABG  surgery x3 using a saphenous vein graft to the obtuse marginal branch of  the left circumflex coronary artery, a saphenous vein graft to the  distal right coronary artery and a saphenous vein graft to the acute  marginal branch of the right coronary artery.  She did well  postoperatively, although she did develop a postoperative atrial  fibrillation and was discharged on amiodarone, which was subsequently  tapered and stopped.  She has done well over the past almost a year  postop.  Last night she got into a verbal argument with a neighbor who  is supposed to be mowing her grass for her and has been failing to live  up to his side of the bargain.  This morning she was still upset about  her situation with her neighbor and began having substernal chest pain  radiating to the shoulder and neck.  She had four nitroglycerins  sequentially at home and she states that they were fresh but they did  not seem to help the pain.  She then called 9-1-1 and when the rescue  squad arrived, they did give her some Nitrolingual spray and four baby  aspirin and she noted gradual relief as she was brought to the  emergency  room by ambulance.   The the patient's home medications are:  1. Lexapro 20 mg daily.  2. Generic Protonix 40 mg daily.  3. Toprol XL generic 25 mg daily.  4. Nitrostat 1/150 sublingually p.r.n.  5. Valium 10 mg at h.s.  6. Lipitor 20 mg daily.  7. Aspirin 81 mg daily.  8. Multivitamin with iron daily.   SOCIAL HISTORY:  She is a widow.  She lives alone on a fall up in  [place].  She depends on a neighbor to mow her grass for her.  The patient is a former smoker.  She quit in 1982.  She does not drink  alcohol.   FAMILY HISTORY:  Positive for heart attacks and strokes.   ALLERGIES:  The patient has no known drug allergies.   REVIEW OF SYSTEMS:  She has had occasional dyspepsia and heartburn.  She  also has had constipation, which has been worse since she has been on  Avastin for  her macular degeneration.  Dr. Fawn Kirk his  ophthalmologist treating her for the macular degeneration with Avastin.  GENITOURINARY:  Occasional dysuria.  RESPIRATORY:  No cough or sputum  production.  ALLERGIES:  She has no known drug allergies.  Remainder of  review of systems is negative in detail.  The patient denies any nausea,  vomiting or diaphoresis associated with this morning's event.   PHYSICAL EXAM:  Blood pressure is 161/63, pulse is 62, normal sinus  rhythm with occasional PVC, respirations are normal.  The temperature is  afebrile.  The oxygen saturation is 96% on 2 liters a minute.  GENERAL APPEARANCE:  A well-developed, well-nourished woman appearing  younger than her stated age.  SKIN:  Warm and dry.  Pupils equal and reactive.  Sclerae clear.  Mouth and pharynx normal.  Carotids normal.  Jugular venous pressure normal.  Thyroid normal.  LUNGS:  Clear to percussion and auscultation.  HEART:  A grade 2/6 systolic ejection murmur at the left sternal edge.  She has frequent PVCs.  There is no diastolic MURMUR.  ABDOMEN:  Soft, nontender.  No masses.   EXTREMITIES:  Weak pedal pulses.  There is no phlebitis or edema.   Her electrocardiogram shows normal sinus rhythm with occasional PVCs and  with nonspecific ST-T wave abnormalities but no acute changes of acute  injury or ischemia.   Her chest x-ray is normal.  The blood work shows a sodium 141, potassium  3.9, BUN 17, creatinine 0.9, hemoglobin 14, white count 4800, CK-MB 1.2,  troponin 0.05.   IMPRESSION:  1. Acute cardiac syndrome, rule out myocardial infarction.  2. Status post coronary artery bypass grafting 1989 and status post      redo coronary artery bypass grafting August 04, 2006.  3. Essential hypertension.  4. Hyperlipidemia.  5. Anxiety and depression.   DISPOSITION:  We are admitting to telemetry.  Will get serial EKGs and  enzymes.  Will continue treatment with aspirin and with beta blockers  and will increase the strength of her beta blocker from 25 to 50 mg  daily and also add low-dose ACE inhibitor for vascular protection.  Will  give her empiric nitrates, continue statins, and give her Lovenox per  pharmacy protocol.  Will continue a proton pump inhibitor for possible  overlying dyspepsia.   If the patient rules out for myocardial infarction, she may be able to  be discharged tomorrow and plan for an outpatient stress Cardiolite.           ______________________________  Cassell Clement, M.D.     TB/MEDQ  D:  07/12/2007  T:  07/13/2007  Job:  [id number]   cc:   Evelene Croon, M.D.

## 2011-03-17 NOTE — Discharge Summary (Signed)
[name], [name]              ACCOUNT NO.:  [id number]   MEDICAL RECORD NO.:  [id number]          PATIENT TYPE:  INP   LOCATION:  3736                         FACILITY:  [facility]   PHYSICIAN:  Cassell Clement, M.D. DATE OF BIRTH:  07-21-1926   DATE OF ADMISSION:  07/12/2007  DATE OF DISCHARGE:  07/13/2007                               DISCHARGE SUMMARY   FINAL DIAGNOSES:  1. Chest pain, myocardial infarction ruled out.  2. Coronary atherosclerosis, status post coronary artery bypass      grafting 1989 and a redo coronary artery bypass grafting on August 04, 2006.  3. Essential hypertension.  4. Hyperlipidemia.  5. Anxiety and depression.   OPERATIONS PERFORMED:  None.   HISTORY:  This 77 year old Caucasian female was admitted with chest  pain, which developed after an argument with a neighbor.  The pain was  substernal, radiating to the shoulders and neck.  She took a total of 4  nitroglycerin without any help at home, and then called 9-1-1 .  EMTs  gave her Nitrolingual spray and 4 baby aspirin, and she had gradual  relief.  By the time she was seen in the emergency room she was  essentially pain-free.   HOME MEDICATIONS:  1. Lexapro 20 mg daily.  2. Protonix 40 mg daily.  3. Toprol XL 25 mg daily.  4. Nitrostat 1/150 CRN.  5. Valium 10 mg  a day chest.  6. Lipitor 20 mg daily.  7. Aspirin 81 mg daily.  8. Multivitamin with iron daily.   She is a nonsmoker, having quit in 1982.   PHYSICAL EXAM:  Her blood pressure on admission was elevated at 161/63,  pulse 62, respirations normal.  There were no carotid bruits.  No  jugular venous distention.  LUNGS:  Clear.  HEART:  Reveals a grade 2/6 systolic murmur at the left sternal edge.  Premature beats are present.  ABDOMEN:  Soft and nontender.  EXTREMITIES:  Showed no edema.  Pedal pulses were weak.   INITIAL LABS:  Include normal CK-MB and normal troponin.  Electrolytes  were normal, with a BUN 17, creatinine  of 0.9, potassium 3.9, white  count 4800, hemoglobin 14.Marland Kitchen   HOSPITAL COURSE:  The patient was admitted to telemetry.  Her rhythm  remained stable.  She was treated with Lovenox, nitrates, beta blocker.  An ACE inhibitor was added to her regimen and her beta blocker dose was  increased from 25 mg to 50 mg.  Serial enzymes showed no myocardial  damage.   By the following morning, blood pressure was 117/52, pulse 53, O2  saturations were are 97%.  The lungs were clear and the heart revealed  no gallop.   The patient was felt to be stable for discharge.  She will be seen back  in the office shortly for a treadmill Cardiolite stress test and a  follow-up BMET, since we are adding an ACE inhibitor to her regimen.   CONDITION ON DISCHARGE:  Improved.           ______________________________  Cassell Clement, M.D.  TB/MEDQ  D:  07/13/2007  T:  07/13/2007  Job:  [id number]

## 2011-03-20 NOTE — Cardiovascular Report (Signed)
[place]  Patient:    [name]                     MRN: [id number] Proc. Date: 05/13/00 Adm. Date:  [id number] Attending:  Rudean Hitt CC:         Cardiac Catheterization Laboratory             Maisie Fus A. Patty Sermons, M.D.                        Cardiac Catheterization  INDICATIONS:  Ms. [name] is a 77 year old female with a history of coronary artery disease, status post coronary artery bypass grafting in 1989.  She is status post PTCA and stenting of the saphenous vein graft to her right coronary artery last year by Dr. Deborah Chalk.  She presents with recurrent chest pain and is transferred to [facility] for further evaluation.  PROCEDURES:  Left heart catheterization with coronary angiography. The right femoral artery was easily cannulated using a modified Seldinger technique.  HEMODYNAMICS:  The left ventricular pressure was 121/23.  The aortic pressure is 119/54.  ANGIOGRAPHY:  The left main coronary artery is relatively small but is otherwise normal.  There are no discrete stenosis.  The left anterior descending artery is occluded proximally.  The left circumflex artery is a moderate sized vessel.  There are minor luminal irregularities in the proximal segment.  There is a tight 90% eccentric stenosis just prior to the origin of the first obtuse marginal artery.  There is some antegrade flow and the first obtuse marginal artery can be seen to fill normally.  The anastomosis of the saphenous vein graft is also normal.  There is some competitive flow of the saphenous vein graft and this graft appears to be normal.  The right coronary artery is occluded proximally.  There are a number of RV marginal branches which appear to be normal.  There is a proximal plaque of approximately 80% with some corresponding dampening and ventricularization of the contrast.  The saphenous vein graft to the first diagonal  vessel is a normal graft. There are no irregularities of the graft.  The diagonal vessel is fairly small but is otherwise unremarkable.  The saphenous vein graft to the second obtuse marginal artery is relatively unremarkable.  There is a proximal 20-30% stenosis that was seen last year. This stenosis has not worsened.  The anastomosis is normal and the distal obtuse marginal artery is normal.  The saphenous vein graft to the right coronary artery was engaged using an RCB catheter.  The graft is relatively normal.  There is some mild re-stenosis in the stent but only about 20-25%.  The flow down the vessel is quite normal.  The PDA in the posterolateral segment artery are normal.  The left internal mammary artery was not engaged selectively.  The subclavian artery was quite tortuous.  There was also a 50% stenosis in the proximal left vertebral artery.  The LIMA was normal throughout its course.  The anastomosis was normal.  The native LAD was unremarkable.  There was brisk flow throughout the entire system.  LEFT VENTRICULOGRAM:  The left ventriculogram was performed in a 30 RAO position.  It reveals akinesis of the inferior base with severe hypokinesis of the mid anterior wall and anterior base.  There was relatively normal contractility of the apex and anterior septal base.  The ejection fraction is approximately 50%.  There  is no significant mitral regurgitation.  COMPLICATIONS:  None.  CONCLUSIONS: 1. Severe native coronary artery disease but with patent saphenous vein    grafts. 2. Well-preserved left ventricular systolic dysfunction but with segmental    wall motion abnormalities, consistent with previous myocardial infarctions.    We will continue with medical therapy.  She will be able to go home today. DD:  05/13/00 TD:  05/13/00 Job: [id number]

## 2011-03-20 NOTE — Discharge Summary (Signed)
NAMEARNOLD, [name]              ACCOUNT NO.:  [id number]   MEDICAL RECORD NO.:  [id number]          PATIENT TYPE:  INP   LOCATION:  2032                         FACILITY:  [facility]   PHYSICIAN:  Evelene Croon, M.D.     DATE OF BIRTH:  07/01/1926   DATE OF ADMISSION:  07/28/2006  DATE OF DISCHARGE:  08/15/2006                               DISCHARGE SUMMARY   ADDENDUM:  The patient's discharge was held due to the patient  converting back into atrial fibrillation.  He was noted to be rate  controlled at that time.  The patient was previously on amiodarone with  pressors and no Coumadin started.  Due to the patient's conversion back  to atrial fibrillation, it was felt best to start the patient on  Coumadin.  The patient had a daily PT/INR check.  She is continued on  Coumadin.  By August 15, 2006, Coumadin was therapeutic at 2.2.  She  had converted back into normal sinus rhythm prior to discharge home.  She is continued on Coumadin, Amiodarone and Lopressor.  Over the next  several days, the patient had no other issues.  Vital signs were stable.  She remained afebrile.  She continued to require oxygenation and had  been arranged for home O2.  The patient's incisions remained clean, dry  and intact and healing well.   The patient was discharged home on August 15, 2006 in stable condition.   Please see dictated discharge summary for follow-up appointments and  discharge instructions.   DISCHARGE MEDICATIONS:  1. Coumadin 5 mg at night.  2. Aspirin 81 mg daily.  3. Toprol XL 25 mg daily.  4. Lipitor 20 mg at night.  5. Ultram 50 mg 1-2 tablets q.4-6h. p.r.n. pain.  6. Amiodarone 40 mg b.i.d. x2 weeks and 200 mg b.i.d.  7. Lexapro 20 mg daily.  8. Protonix 40 mg daily.  9. Niferex 150 mg daily.  10.Lasix 40 mg daily x5 days.  11.Potassium chloride 20 mEq daily x5 days.  12.Valium 2.5 mg at night.  13.Advair 250/50 one puff b.i.d.      Theda Belfast, [place]      Evelene Croon, M.D.  Electronically Signed    KMD/MEDQ  D:  10/19/2006  T:  10/19/2006  Job:  [id number]   cc:   Evelene Croon, M.D.  Cassell Clement, M.D.

## 2011-03-20 NOTE — Op Note (Signed)
NAMEDHRUTI, GHUMAN              ACCOUNT NO.:  [id number]   MEDICAL RECORD NO.:  [id number]          PATIENT TYPE:  INP   LOCATION:  2302                         FACILITY:  [facility]   PHYSICIAN:  Evelene Croon, M.D.     DATE OF BIRTH:  14-Jul-1926   DATE OF PROCEDURE:  08/04/2006  DATE OF DISCHARGE:                                 OPERATIVE REPORT   PREOPERATIVE DIAGNOSIS:  Mediastinal bleeding status post redo coronary  bypass graft surgery.   POSTOPERATIVE DIAGNOSIS:  Mediastinal bleeding status post redo coronary  bypass graft surgery.   OPERATIVE PROCEDURE:  Exploration of mediastinum and control of bleeding.   ATTENDING SURGEON:  Evelene Croon, M.D.   ANESTHESIA:  General endotracheal.   CLINICAL HISTORY:  This patient is a 78 year old woman who is a few hours  status post redo coronary artery bypass graft surgery.  She initially  appeared hemostatic but then began having increased chest tube output.  Platelet count was 101,000.  INR is 1.6.  She was given 10 units of  platelets but continued to have increasing chest tube output over 200 mL an  hour and the decision was made to take her back to the operating room.   DESCRIPTION OF PROCEDURE:  The patient was taken to the operating room in  hemodynamically stable condition.  She was placed back on the operating room  table.  She was still intubated and then was placed under general  endotracheal anesthesia.  The dressings were removed and the chest prepped  with Betadine soap and solution and draped in the usual sterile manner.  The  median sternotomy incision was reentered and the sternal wires removed. The  sternum was separated and there was a moderate amount of bright red blood  present within the anterior mediastinum.  This was suctioned and completely  removed.  It was immediately apparent that there was bleeding coming from  one of the manubrial wires on the left side.  This was controlled with  electrocautery and a  figure-of-eight suture around the manubrium.  This  resulted in hemostasis at this site.  The remainder of the blood was removed  from the mediastinum.  The cannulation sites were hemostatic.  The proximal  and distal anastomoses of the grafts appeared hemostatic.  There were no  other bleeding sites noted within the mediastinum.  The chest tubes were  declotted.  They were placed back in their same position.  The sternum was  then closed with double #6 stainless steel wires.  The fascia was closed  with continuous #1 Vicryl suture. The subcutaneous tissue was closed with  continuous 2-0 Vicryl and the skin with a 3-0 Vicryl subcuticular closure.  The sponge, needle and instrument counts were correct __________ .  Dry  sterile dressing applied over the incision and around the chest tubes which  were Pleur-Evac suctioned.  The patient remained hemodynamically stable and  was transported to the SICU in guarded but stable condition.      Evelene Croon, M.D.  Electronically Signed    BB/MEDQ  D:  08/04/2006  T:  08/06/2006  Job:  [id number]

## 2011-03-20 NOTE — Cardiovascular Report (Signed)
[name], [name]              ACCOUNT NO.:  [id number]   MEDICAL RECORD NO.:  [id number]          PATIENT TYPE:  OIB   LOCATION:  2899                         FACILITY:  [facility]   PHYSICIAN:  Colleen Can. Deborah Chalk, M.D.DATE OF BIRTH:  08/01/1926   DATE OF PROCEDURE:  07/28/2006  DATE OF DISCHARGE:                              CARDIAC CATHETERIZATION   HISTORY:  [name] is a 77 year old female with previous coronary artery  bypass grafting in 1989.  She had graft with the LIMA to the LAD, saphenous  vein graft to the diagonal, saphenous vein graft to obtuse marginal, and  saphenous vein graft to the right coronary artery.  There were three  separate  aortic anastomoses for the vein grafts.  She is referred because  of increasing angina.   PROCEDURE:  Left heart catheterization with selective coronary angiography,  left ventricular angiography, angiography of the saphenous vein graft times  3, and angiography of the left internal mammary artery.   TYPE AND SITE OF ENTRY:  Percutaneous right femoral artery with Angio-Seal.   CATHETERS:  A 6-French 4-curved Judkins right and left coronary caths, 6  French pigtail ventriculographic catheter.   CONTRAST MATERIAL:  Omnipaque.   MEDICATION GIVEN PRIOR TO THE PROCEDURE:  Valium 10 mg p.o.   MEDICATIONS GIVEN DURING THE PROCEDURE:  Versed 2 mg IV, Ancef 1 gram IV.   COMMENTS:  The patient tolerated the procedure well.   HEMODYNAMIC DATA:  The aortic pressure was 132/60, LV was 137/13-24.   ANGIOGRAPHIC DATA:  Left ventricular angiogram was performed in the RAO  position.  Overall cardiac size and silhouette are normal.  Global ejection  fraction is estimated to be 40%.  There is inferobasilar akinesia and  anterolateral hypokinesia. There was no mitral regurgitation, intracardiac  calcification, or intracavitary filling defect.   1. Left main coronary artery is normal.  2. Left anterior descending is totally occluded  proximally.  3. Left circumflex. The left circumflex has long segmental narrowing of      approximately 40 mm in length.  There is severe sequential 80-90%      narrowing.  You could clearly see where the previous saphenous vein      graft was inserted and, distal to that, the obtuse marginal was      relatively large in size. It was a tortuous distal vessel with mild      atherosclerosis but no greater than 30-40% narrowing distally.  4. Left internal mammary graft to the LAD is widely patent with a nice      insertion and good distal runoff.  There are multiple septal      perforating branches despite flow to the distal right coronary artery.      There is also collateral flow to the diagonal vessel.  5. Right coronary artery.  The right coronary artery is totally occluded      in its midportion.  There is a high right ventricular branch with a 90%      focal stenosis.  In the proximal segment of the right coronary artery,  there is ostial narrowing with damping of the catheter as not enters      the right coronary artery and then diffuse 80-90% narrowing prior to      its total occlusion.  6. Saphenous vein graft to the right coronary artery is totally occluded.  7. Saphenous vein graft to diagonal is totally occluded.  8. Saphenous vein graft to the obtuse marginal is totally occluded.   OVERALL IMPRESSION:  1. Moderate left ventricular dysfunction with inferobasilar akinesia and      anterolateral hypokinesia.  2. Severe three-vessel coronary disease.  3. Totally occluded saphenous vein grafts times 3 with patent left      internal mammary graft.   DISCUSSION:  In light of these findings and the severity of them, [name]  will need to be referred for redo coronary artery bypass grafting.      Colleen Can. Deborah Chalk, M.D.  Electronically Signed     SNT/MEDQ  D:  07/28/2006  T:  07/30/2006  Job:  [id number]   cc:   Cassell Clement, M.D.

## 2011-03-20 NOTE — H&P (Signed)
NAME:  [name], [name]                        ACCOUNT NO.:  [id number]   MEDICAL RECORD NO.:  [id number]                   PATIENT TYPE:  INP   LOCATION:  1823                                 FACILITY:  [facility]   PHYSICIAN:  Darden Palmer., M.D.         DATE OF BIRTH:  October 14, 1926   DATE OF ADMISSION:  06/12/2002  DATE OF DISCHARGE:                                HISTORY & PHYSICAL   REASON FOR ADMISSION:  Chest pain.   HISTORY:  This 77 year old woman has a prior history of an inferior  myocardial infarction in 1983.  She subsequently had a subendocardial  infarction in June 1987 and was found to have an occluded LAD at that time.  She had angioplasty of the circumflex coronary artery May 07, 1986 and was  readmitted in 1989, at which time she had three-vessel disease demonstrated.  She had bypass graft at that time with an internal mammary graft to the LAD,  a vein graft to the diagonal, a vein graft to the circumflex, and a vein  graft to the right coronary artery.  She has had some difficulty with  anxiety for some period of time and has had several catheterizations  previously and has had some troubled with depression.  She was admitted with  prolonged chest discomfort on February 16, 1999 and was found to have severe  disease involving the bypass graft to the right coronary artery.  This was  stented by Dr. Deborah Chalk at that time with a 3.0 x 18 mm Tristar stent.  She  was subsequently readmitted with chest pain May 13, 2000 and was found to  have occlusion of the right coronary artery, LAD, and 90% proximal first  marginal stenosis.  The vein graft to the diagonal, to the second marginal,  and to the right coronary artery were patent and there was minimal  restenosis involving the stent at that time.  She has done relatively well  at that time and goes dancing in her neighborhood dance hall every Saturday  night and normally does not have much recurrent chest pain and has  been  getting along well recently.  She did develop some chest discomfort about 3-  4 days ago described as a sharp pain, knife like, involving the left chest  area which would then leave her with a deep soreness.  The symptoms would  wax and wane and would not necessarily be related to exertion or with food.  Today, she had mid-anterior chest pain similar to what she had previously  described as heaviness and pressure and took three nitroglycerin and this  was unrelieved.  She called EMS and her symptoms gradually abated. She has  had difficulty with increasing migraine headaches the past several days  also.   PAST MEDICAL HISTORY:  Her past history is remarkable for previous  hyperlipidemia.  There is a prior history of cholelithiasis but I do not  believe that  she has ever had her gallbladder taken out.  She has had  previous angioplasty and previous hyperlipidemia.  She has had chronic  headaches and has had psychiatric problems with hospitalizations previously.   PAST SURGICAL HISTORY:  Coronary bypass grafting, D&C, operation for relief  of intestinal blockage.   ALLERGIES:  She says that LOPRESSOR causes alopecia.   CURRENT MEDICATIONS:  1. Enteric-coated aspirin daily.  2. Valium 10 b.i.d.  3. Premarin 0.625 daily.  4. Amitriptyline 25 q.h.s.  5. Toprol XL 50 daily.  6. Lasix 20 daily.  7. K-Dur 20 daily.  8. Stool softener 2 q.h.s.  9. Prozac 90 mg weekly.  10.      Nexium 40 mg daily.  11.      Lipitor 10 mg daily.   FAMILY HISTORY:  Father died of an accident.  Mother is deceased.   SOCIAL HISTORY:  She is married and has no children.  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   REVIEW OF SYSTEMS:  She has had increasing headaches described as migraines  that have been bothering her recently.  They have been occurring almost  daily.  She has had known cholelithiasis and also has symptoms of reflux.  She had chronic gum surgery.  She has had some mild  dyspnea.  She has had  significant bronchitis and has had significant hyperlipidemia previously.  She had some edema involving her left lower extremity previously.  Other  than as noted above, the remainder of the review of systems is unremarkable.   PHYSICAL EXAMINATION:  GENERAL:  She is a pleasant woman who appears her  stated age and is in no acute distress.  VITAL SIGNS:  Her blood pressure is currently 130/74, pulse is 76.  SKIN:  Warm and dry.  HEENT:  EOMI, PERRLA.  C&S clear.  Fundi:  Previous laser surgery noted.  Pharynx negative.  NECK:  Supple without masses, JVD, thyromegaly, or bruits.  LUNGS:  Clear to A&P.  CARDIAC:  Normal S1 and S2.  There was no S3.  ABDOMEN:  Soft and nontender.  No hepatosplenomegaly, masses, or aneurysm.  EXTREMITIES:  Femoral distal pulses were 2+.  There was no edema noted.   LABORATORY DATA:  A 12-lead electrocardiogram shows nonspecific ST-T wave  abnormality.   IMPRESSION:  1. Chest pain with some other atypical features, rule out unstable angina.  2. Coronary artery disease with:     a. Previous anterior and inferior infarctions.     b. Previous coronary bypass grafting 1989.     c. Previous stenting of the right coronary artery graft in 2000.  3. Anxiety.  4. Hyperlipidemia under treatment.  5. Cholelithiasis.  6. History of reflux.   RECOMMENDATIONS:  Begin Lovenox, continue other medicines, rule out  myocardial infarction with serial CPK and EKG's.  She likely will require  repeat catheterization to assess patency of grafts.                                                Darden Palmer., M.D.    WST/MEDQ  D:  06/12/2002  T:  06/12/2002  Job:  [id number]   cc:   Thomas A. Patty Sermons, M.D.

## 2011-03-20 NOTE — Discharge Summary (Signed)
[name], [name]              ACCOUNT NO.:  [id number]   MEDICAL RECORD NO.:  [id number]          [name] TYPE:  INP   LOCATION:  2032                         FACILITY:  [facility]   PHYSICIAN:  Evelene Croon, M.D.     DATE OF BIRTH:  1926/05/12   DATE OF ADMISSION:  07/28/2006  DATE OF DISCHARGE:                                 DISCHARGE SUMMARY   PRIMARY DIAGNOSIS:  Severe 3-vessel coronary artery disease and saphenous  vein graft occlusion, status post coronary artery bypass grafting sugery in  1989.   IN HOSPITAL DIAGNOSES:  1. Mediastinum bleeding, status post redo coronary artery bypass grafting.  2. Postoperative atrial fibrillation.  3. Postoperative volume overload.  4. Postoperative anxiety.  5. Postoperative pulmonary edema.  6. Acute blood loss anemia postoperatively.   SECONDARY DIAGNOSES:  1. History of myocardial infarction, status post coronary artery bypass      grafting done 1989 by Dr. Andrey Campanile.  [name] had LIMA to left anterior      descending, saphenous vein graft to diagonal, saphenous vein graft to      obtuse marginal, saphenous vein graft to right coronary artery.  2. Status post stent placement in 2002, saphenous vein graft to right      coronary artery.  3. Osteoarthritis.  4. History of anxiety and depression.  5. Gastroesophageal reflux disease with peptic ulcer disease.  6. History of cholecystectomy in 1992.  7. Status post hysterectomy in 1993.  8. Nasal allergies.  9. History of mitral regurgitation.  10.Status post surgery for intestinal blockage.  11.History of dilation and curettage.   ALLERGIES:  NO KNOWN DRUG ALLERGIES.   IN HOSPITAL OPERATIONS AND PROCEDURES:  1. Cardiac catheterization with left heart catheterization with selective      coronary angiography, left ventricular angiography, angiography      saphenous vein graft x3 and angiography of left internal mammary      artery.  2. Redo coronary artery bypass grafting x3 using a  saphenous vein graft to      obtuse marginal branch of the left circumflex coronary artery,      saphenous vein graft to distal right coronary artery, saphenous vein      graft to acute marginal branch of the coronary artery.  3. Endoscopic vein harvesting from right leg.  4. Exploration of mediastinum and controlled bleeding.   HISTORY AND PHYSICAL AND HOSPITAL COURSE:  The [name] is a 77 year old  female who underwent coronary artery bypass grafting x4 by Dr. Andrey Campanile in  1989.  She recently presented with progressive substernal chest pain with  exertion and at rest.  She had markedly positive Cardiolite scan on  July 23, 2006 with an ejection fraction of 42%.  Cardiac  catheterization was done on July 28, 2006, which severe native 3-vessel  coronary artery disease with a patent left internal mammary artery graft to  the left anterior descending, this was occluded proximally.  All saphenous  vein grafts were occluded.  There was a high-grade proximal left circumflex  stenosis before the large marginal branch.  The marginal branch had distal  disease  in it.  The right coronary artery was occluded and filled by  collaterals from the left.  There was also diagonal occlusion, which had  been grafted previously and filled by collaterals.  Following  catheterization, the [name] was seen and evaluated by Dr. Laneta Simmers.  Dr.  Laneta Simmers discussed with [name] undergoing redo coronary artery bypass  grafting.  He discussed the risks and benefits of this procedure.  The  [name] acknowledged understanding and agreed to proceed.  The [name] had  been placed on Plavix following her stent placement in 2000.  Due to  [name]'s stability, it is felt that [name] should be off Plavix for 5  days.  The [name] remained stable during this 5 day period.  The [name]  also underwent bilateral carotid duplex ultrasound, which showed the right  to have no significant ICA stenosis.  On the left, there is  40% to 60%  proximal ICA stenosis with 60% to 80% distal plaque.  Surgery was scheduled  for August 04, 2006.  For details of [name]'s past medical history and  physical exam, please see dictated history and physical.   The [name] was taken to the operating room on August 04, 2006, where she  underwent redo coronary artery bypass grafting x3 using a saphenous vein  graft to the obtuse marginal branch of the left circumflex coronary artery,  saphenous vein graft to distal right coronary artery, saphenous vein graft  to the acute marginal branch of the right coronary artery.  Endoscopic vein  harvesting was done from the right leg.  The [name] tolerated this  procedure well and she was transferred to the intensive care unit in stable  condition.  During [name]'s immediate postoperative course, she was  initially seen to be hemodynamically stable.  Over the course of the next  several hours, she began to have increased chest tube output.  Platelet  count was 101.  [name] was given 10 units of platelets, but continued to  increased chest tube output.  At that time, the decision was made to take  the [name] back to the operating room for exploration of mediastinum and  control of bleeding.  [name] was taken back to the OR on August 04, 2006  later that evening.  She tolerated this procedure well and was retransferred  back to the intensive care unit.  Following second surgery, [name] was seen  to be stable.  Hematocrit was 27%.  Her hemoglobin and hematocrit were  monitored closely postoperatively.  Postop day 1, seemed to be stable at 8.8  and 25.9.  Chest tube output was minimal.  Chest tube lines were  discontinued postop day 1.  Her hemoglobin and hematocrit was continued to  be monitored.  Her hemoglobin dropped to 8.7 on postop day 5.  The [name]  was started on Niferex.  The [name] was asymptomatic from this.  Her hemoglobin and hematocrit stabilized and started to improve.   During the  [name]'s postoperative course, she developed postoperative atrial  fibrillation.  She went into AFib postop day 2.  She was started on IV  amiodarone.  Following initiation of IV amiodarone, [name] converted to  normal sinus rhythm.  She was able to be converted to p.o. amiodarone the  following day.  Over the next several days, the [name] did continue to go  in and out of atrial fibrillation.  Lopressor was increased and she was  continued on the amiodarone.  Postop day 7, the [name] continued to have  episodes of AFib, it was rate controlled AFib.  It was felt to initiate  beginning a Coumadin.  We will monitor the [name]'s heart rate.  Postoperatively, [name] also developed pulmonary edema on chest x-ray.  She  was started on nebulizers and aggressive pulmonary toilet.  She remained on  oxygen over the next several days.  With aggressive treatment, the [name]  was able to be weaned off oxygen saturating greater than 90% and pulmonary  edema improved.  The [name] was also noted to have volume overload  postoperatively.  She was started on diuretics.  Prior to discharge home,  [name] was back to her baseline weight.   During [name]'s postoperative course, vital signs were monitored.  They  seemed to be stable.  She was afebrile.  [name]'s incisions were clean,  dry, intact and healing well.  She was out of bed and ambulating well.  Tolerating a regular diet well.  Bowel movements within normal limits.  She  had oxygen saturations greater than 90% on room air.  Last blood work  obtained was postop day 7, white blood count was 7.6, hemoglobin 8.8,  hematocrit 26.0, platelet count 275.  Sodium of 139, potassium of 3.4,  chloride of 93, bicarb of 39, BUN of 22, creatinine 0.9 and glucose of 103.   [name] is tentatively ready for discharge home in the next 1 to 2 days  pending [name]'s heart rate.   FOLLOWUP APPOINTMENT:  A followup appointment has been arranged  with Dr.  Laneta Simmers for August 07, 2006 at 3 p.m.  [name] will need to contact Dr.  Yevonne Pax office to schedule followup appointment with him in 2 weeks.  [name] will obtain a PA and a lateral chest x-ray at this appointment,  which she will then bring with her to Dr. Sharee Pimple appointment.   ACTIVITY:  [name] is instructed to do no driving until released to do so,  no heavy lifting over 10 pounds.  The [name] is told to ambulate 3 to 4  times per a day, progress if tolerated and continue breathing exercises/   INCISION CARE:  [name] was told to shower, washing her incisions using soap  and water.  She is to contact the office if she develops any drainage or  opening from any of her incision sites.   DIET:  The [name] was educated on diet to be low-fat, low-salt.   DISCHARGE MEDICATIONS:  1. Aspirin 81 mg daily.  2. Lopressor 25 mg b.i.d.  3. Lipitor 20 mg at night. 4. Amiodarone 400 mg b.i.d. x2 weeks, then 200 mg b.i.d.  5. Lexapro 20 mg daily.  6. Protonix 40 mg daily.  7. Niferex 150 mg daily.  8. Lasix 40 mg daily x5 days.  9. Potassium chloride 20 mEq daily x5 days.  10.Valium 2.5 mg at night p.r.n.  11.Advair 250/50 one puff b.i.d.  12.Ultram 50 mg one to two tabs q. 4 to 6 hours p.r.n. pain.  13.Coumadin, [name] initially started due to in and out of AFib.  We will      plan discharge home on Coumadin.  Dose will be based on [name]'s      discharge PT/INR.      Theda Belfast, [place]      Evelene Croon, M.D.  Electronically Signed    KMD/MEDQ  D:  08/11/2006  T:  08/11/2006  Job:  [id number]   cc:   Cassell Clement, M.D.

## 2011-03-20 NOTE — Cardiovascular Report (Signed)
NAME:  [name], [name]                        ACCOUNT NO.:  [id number]   MEDICAL RECORD NO.:  [id number]                   PATIENT TYPE:  INP   LOCATION:  3738                                 FACILITY:  [facility]   PHYSICIAN:  W. Ashley Royalty., M.D.         DATE OF BIRTH:  1926/02/16   DATE OF PROCEDURE:  DATE OF DISCHARGE:  06/14/2002                              CARDIAC CATHETERIZATION   HISTORY OF PRESENT ILLNESS:  The patient is a 77 year old woman with bypass  grafting in 1989 who presented with chest pain suggestive of unstable  angina. Myocardial infarction was ruled out.   HEMODYNAMIC DATA:  Aorta post contrast 125/58. LV post contrast 125/15.   ANGIOGRAPHIC DATA:  Left ventriculogram performed in the 30 degree RAO  projection. The aortic valve is normal. The mitral valve is normal. The left  ventricle was mildly dilated. There was severe hypokinesis of the inferior  wall and mild to moderate hypokinesis of the anterolateral wall. The apex  contracts normally. The estimated ejection fraction is 50%. Coronary  arteries arise and distribute normal. The left main coronary artery appears  normal. The left anterior descending is occluded proximally. The circumflex  has a subtotal of 99% stenosis both antegrade and flow from a previous  bypass graft. Right coronary artery has proximal 60% stenosis prior to a  large acute marginal branch that was occluded afterwards. Saphenous vein  graft to the obtuse marginal branch has an osteal eccentric 50-60% stenosis.  The remainder of the graft is widely patent. The distal anastomotic site is  patent with bi-directional flow into the distal graft. Saphenous vein graft  to the diagonal is widely patent with a patent proximal distal anastomotic  site. Saphenous vein graft to the right coronary artery is widely patent  with patent proximal distal anastomotic sites. Previously placed Stent has a  segmental 40% narrowing in it's mid portion.  Internal mammary graft to the  LAD is widely patent. The distal LAD is somewhat small.   IMPRESSION:  1. Severe three vessel disease with occlusion of two of three arteries and     subtotal occlusion of the circumflex.  2. Patent saphenous vein grafts to RCA, diagonal, and circumflex and patent     internal mammary graft to the LAD.  3. Abnormal ventricular function with inferior hypokinesis and anterolateral     hypokinesis.  4. Atherosclerotic disease involving the ostium of the circumflex graft,     which is moderately severe.   RECOMMENDATIONS:  Continue medical therapy.                                               Darden Palmer., M.D.    WST/MEDQ  D:  06/13/2002  T:  06/17/2002  Job:  [id number]   cc:  Thomas A. Patty Sermons, M.D.

## 2011-03-20 NOTE — H&P (Signed)
[place]  Patient:    [name], [name]                     MRN: [id number] Adm. Date:  [id number] Attending:  Rudean Hitt                         History and Physical  CHIEF COMPLAINT:  Chest pain.  HISTORY:  This is a 77 year old woman who has known ischemic heart disease, who is transferred from [facility] where she was admitted last evening.  This woman has a long history of ischemic heart disease.  She had a history of myocardial infarctions in 1980, 1985, and 1987.  She had cardiac bypass graft surgery in 1989 by Dr. Particia Lather.  In 1997, she had cardiac catheterization, at which time she was noted to have all of her saphenous vein grafts patent, as well as her LIMA to her LAD patent.  She was felt at that time to be having chest pain secondary to gastric erosions, and she responded to Prilosec.  She was admitted to the hospital on February 16, 1999 with an acute inferior wall myocardial infarction.  She had an acute cardiac catheterization by Dr. Delfin Edis on the day of admission, which showed 100% occlusion of the saphenous vein graft to the right coronary artery which was treated with a stent, with good subsequent distal flow.  The LIMA to the LAD was found to be okay and the native LAD artery was 100% occluded proximally.  Saphenous vein graft to the diagonal was okay, saphenous vein graft to the obtuse marginal was okay.  Circumflex native circulation showed 99% occlusion prior to the obtuse marginal graft, and there was inferobasilar akinesis with ejection fraction of 45% at the time of catheterization.  The patient has subsequently done well.  She underwent an adenosine Cardiolite stress test, which showed distal septal and apical scar, but no significant reversible ischemia.  The stress test was done in September 2000.  Patient was noted to have an ejection fraction of 48% at the time of her nuclear  study.  MEDICATIONS:  The patients present medications have included the following:  1. Enteric-coated aspirin once a day.  2. Valium 10 mg generic q.4-6h. p.r.n. for severe anxiety.  3. Premarin 0.625 mg daily.  4. Nitrostat 1/150 p.r.n.  5. Premarin cream once a week p.r.n.  6. Amitriptyline 25 mg one at h.s.  7. Toprol XL 50 one-half tablet daily.  8. Celebrex 200 mg daily, not presently taking it.  9. Darvocet-N 100 p.r.n. 10. Lasix 20 mg daily. 11. K-Dur 20 twice a day. 12. Stool softener p.r.n. 13. Prozac 20 mg daily. 14. Osteo Bi-Flex one t.i.d.  The patient has a past history of mild mitral regurgitation and takes SBE prophylaxis at the time of dental work and other procedures.  HISTORY OF PRESENT ILLNESS:  The patient was in her usual state of health yesterday and went shopping with a friend up in [place], [place].  It was very hot and they became tired.  She returned home in the early evening and after supper she began having chest pain.  Pain was unrelieved by sublingual nitroglycerin at home, and she was taken to [facility] where she was evaluated and admitted.  The first two sets of enzymes were negative.  It was felt that she should have further testing and she requested to come to [facility] for  that.  SOCIAL HISTORY:  She does not use alcohol or tobacco.  She quit smoking in 1981.  The patient is married.  PREVIOUS SURGERIES:  Besides open heart surgery in 1989, include stent to the graft to the right coronary artery in April 2000, a hysterectomy, cholecystectomy, and tonsillectomy.  FAMILY HISTORY:  Positive for coronary disease.  Her mother had coronary disease and a stroke.  Her sister has had bypass and her brother has had bypass.  REVIEW OF SYSTEMS:  Negative except for present illness.  She has also had a lot of problem with arthritis and is requesting to go back on the Celebrex. The Osteo Bi-Flex has not helped her arthritis of the hands very  much.  PHYSICAL EXAMINATION:  VITAL SIGNS:  Blood pressure 130/80, pulse 70, normal sinus rhythm.  GENERAL:  Color is good.  HEENT:  Negative.  CHEST:  Clear.  HEART:  No murmur, gallop, or rub.  ABDOMEN:  Soft, nontender.  She has good femoral pulses.  She has slight tenderness of the right femoral artery.  EXTREMITIES:  Show no edema.  She has good pedal pulses.  LABORATORY DATA:  Her electrocardiograms show minor degrees of ST-T wave change but no acute changes.  Laboratory studies done at [facility] and preliminarily here are satisfactory.  Patient reports chest x-ray done at [facility], uncertain results.  DIAGNOSTIC IMPRESSION: 1. Chest pain, rule out unstable angina, rule out MI. 2. Coronary artery disease with prior coronary artery bypass graft surgery,    and that was in 1989.  In April 2000, she had acute inferior wall    myocardial infarction caused by an acute vein graft occlusion in the    graft to the right coronary artery, and this was successfully    angioplastied and stented by Dr. Deborah Chalk. 3. Chronic anxiety. 4. Chronic headaches. 5. Depression. 6. Osteoarthritis.  PLAN:  Obtain serial enzymes.  Will keep her on IV heparin overnight.  Will plan for cardiac catheterization by Dr. Elease Hashimoto in Dr. Angelina Pih absence on May 13, 2000.  Will check fasting lipids and consider statins if LDL is above 100.  Will continue her on her beta blocker and her aspirin and will restart the Celebrex, and will also cover her GI tract with Protonix. DD:  05/12/00 TD:  05/13/00 Job: [id number]

## 2011-03-20 NOTE — H&P (Signed)
NAMEJERICHO, [name]              ACCOUNT NO.:  [id number]   MEDICAL RECORD NO.:  [id number]          PATIENT TYPE:  OIB   LOCATION:  2899                         FACILITY:  [facility]   PHYSICIAN:  Colleen Can. Deborah Chalk, M.D.DATE OF BIRTH:  04-Dec-1925   DATE OF ADMISSION:  07/28/2006  DATE OF DISCHARGE:                                HISTORY & PHYSICAL   CHIEF COMPLAINT:  Chest pain with abnormal Cardiolite.   HISTORY OF PRESENT ILLNESS:  The patient is a very pleasant 77 year old  white female who has multiple medical problems.  She was seen toward the mid  part of September with complaints of exertional chest pain as well as  indigestion.  She was placed on Protonix and referred for Cardiolite study.  The Cardiolite was performed on July 23, 2006.  The patient exercised  on the Bruce protocol and did develop substernal chest pain and right arm  pain.  Her EKG showed deep ST depression in the inferolateral leads.  She  had significant anterior apical reversible ischemia in the area of previous  infarction.  Her ejection fraction is 42%.  In light of her symptoms as well  as abnormal Cardiolite study, she is now referred for repeat  catheterization.   PAST MEDICAL HISTORY:  1. Remote history of myocardial infarction.  She subsequently underwent      coronary artery bypass grafting per Delsa Grana. Andrey Campanile, M.D. in October      of 1989 with LIMA to the LAD, saphenous vein graft to the diagonal, OM,      and right coronary.  Her last catheterization was in 2003 and she has      had in April of 2000 stent placement to the saphenous vein graft to the      right coronary.  2. Osteoarthritis.  3. Stress.  4. Anxiety and depression.  5. Gastroesophageal reflux disease with peptic ulcer disease.  6. History of cholecystectomy in 1992.  7. Nasal allergies.  8. Hysterectomy in 1993.  9. History of mitral regurgitation.  10.Previous surgery for intestinal blockage.  11.Remote history of  D&C.   ALLERGIES:  No known drug allergies.   CURRENT MEDICATIONS:  1. Baby aspirin daily.  2. Valium 10 mg two tablets at bedtime.  3. Hormone patch p.r.n.  4. Nitroglycerin p.r.n.  5. Toprol XL 25 mg half tablet daily.  6. Lasix 20 mg a day.  7. Potassium 20 mEq t.i.d.  8. Stool softener p.r.n.  9. Lipitor 20 mg a day.  10.Plavix 75 mg a day.  11.Ibuprofen p.r.n.  12.Darvocet p.r.n.  13.Lexapro 20 mg a day.  14.Protonix 40 mg a day.  15.IMDUR 60 mg a day.   FAMILY HISTORY:  Positive for ischemic heart disease.  Her mother died with  coronary disease and stroke.  Her father died early of an accident.   SOCIAL HISTORY:  She is widowed.  She has been left to care for a farm.  She  has been under significant stress in that regard.  She has no alcohol or  tobacco use.   REVIEW OF SYSTEMS:  Basically as  noted above and is otherwise unremarkable.   PHYSICAL EXAMINATION:  GENERAL:  She is somewhat anxious, but in no acute  distress.  VITAL SIGNS:  Weight is 129 pounds, blood pressure 128/70 sitting, 118/70  standing, heart rate 60, respirations 18.  She is afebrile.  SKIN:  Warm and dry.  Color is unremarkable.  LUNGS:  Basically clear.  HEART:  Regular rhythm.  There is no gallop and no murmur.  ABDOMEN:  Soft and nontender with positive bowel sounds.  EXTREMITIES:  Without edema.  NEUROLOGY:  No gross focal deficits.   LABORATORY DATA:  Pertinent labs are pending.   IMPRESSION:  1. Chest pain.  2. Abnormal stress Cardiolite study.  3. Known ischemic heart disease with remote myocardial infarction,      previous coronary artery bypass grafting as well as stenting of the      saphenous vein graft to the right coronary.  4. Anxiety and depression.   PLAN:  We will proceed on with diagnostic catheterization and the procedure  has been reviewed in full detail and she is willing to proceed on Wednesday  July 28, 2006.      Sharlee Blew, N.P.      Colleen Can. Deborah Chalk, M.D.  Electronically Signed    LC/MEDQ  D:  07/27/2006  T:  07/28/2006  Job:  [id number]

## 2011-03-20 NOTE — Discharge Summary (Signed)
NAME:  [name], [name]                        ACCOUNT NO.:  [id number]   MEDICAL RECORD NO.:  [id number]                   PATIENT TYPE:  INP   LOCATION:  3738                                 FACILITY:  [facility]   PHYSICIAN:  Darden Palmer., M.D.         DATE OF BIRTH:  [birth year]   DATE OF ADMISSION:  06/12/2002  DATE OF DISCHARGE:  06/14/2002                                 DISCHARGE SUMMARY   FINAL DIAGNOSES:  1. Chest pain of undetermined etiology, myocardial infarction ruled out.     a. Catheterization showing an ostial stenosis of 60% of the second vein        graft to the obtuse marginal.  The patent vein grafts to the right        coronary artery, diagonal and internal mammary graft to the left        anterior descending artery.  2. Anxiety.  3. Previous bypass grafting.  4. Dyslipidemia.   PROCEDURE:  Cardiac catheterization.   HISTORY OF PRESENT ILLNESS:  This 77 year old woman has a prior history of  an inferior myocardial infarction, previous subendocardial infarction with  previous bypass grafting in 1989.  She has had previous stenting of the vein  graft to the right coronary artery in 2000.  She developed chest discomfort  three to four days prior to admission.  She comes in with a sharp, knife-  like pain involving the left chest area which was related with a deep  soreness.  The symptoms would wax and wane but not necessarily be related to  exertion or food.  She had midanterior chest pain, somewhat like she  previously described as angina with having some pressure.  She took three  nitroglycerin with no relief.   She presented to the emergency room and was admitted to rule out an MI.  Please see the previously dictated history and physical for the remainder of  the details.   HOSPITAL COURSE:  Lab data showed negative CPK and troponins.  White count  was 14,000.  Otherwise CBC was normal.  Chemistry panel was normal with the  exception of an albumin of  2.8.   The patient was admitted and had a myocardial infarction ruled out.  Because  of the age of her bypass graft and symptoms suggestive of unstable angina,  she underwent cardiac catheterization on June 13, 2002.  Left ventricular  function showed anterior hypokinesis and anterolateral hypokinesis.  EF was  around 45-50%.  Coronary arteries were totally occluded proximally with the  exception of antegrade flow in the marginal branch which also filled by vein  graft.  The internal mammary graft to the LAD was patent.  The vein graft to  the right coronary artery was patent.  The previously stented site was  patent.  The vein graft to the diagonal was patent.  The circumflex graft  had an ostial 60% stenosis which was not felt to  be flow-limiting.   It was felt that she should be treated medically.  She had no recurrent  chest pain.  She had some diarrhea the night prior to her discharge but felt  fine the next day.   She was able to go home and discharged in improved condition.   DISCHARGE MEDICATIONS:  1. Enteric-coated aspirin 81 mg q.d.  2. Plavix 75 mg q.d.  3. Valium 10 mg b.i.d.  4. Premarin 0.625 mg q.d.  5. Amitriptyline 25 mg q.h.s.  6. Toprol XL 50 mg q.d.  7. Lasix 20 mg q.d.  8. K-Dur 20 q.d.  9. Stool softener 2 q.h.s.  10.      Prozac 90 mg weekly.  11.      Nexium 40 mg q.d.  12.      Lipitor 10 mg q.d.   DISCHARGE INSTRUCTIONS:  She is to call Dr. Clovis Pu. Brackbill and see him  in follow-up in one week.  She is to call if there are problems.                                                 Darden Palmer., M.D.    WST/MEDQ  D:  06/14/2002  T:  06/18/2002  Job:  [id number]   cc:   Thomas A. Patty Sermons, M.D.

## 2011-03-20 NOTE — Discharge Summary (Signed)
[facility]  Patient:    [name], [name]                     MRN: [id number] Adm. Date:  [id number] Disc. Date: [id number] Attending:  Rudean Hitt CC:         Kristeen Miss, M.D., Cardiology   Discharge Summary  FINAL DIAGNOSES: 1. Unstable angina pectoris. 2. Coronary atherosclerosis. 3. Headache. 4. Situational depression. 5. Osteoarthritis. 6. Prior myocardial infarction. 7. Prior aortic coronary artery bypass graft surgery. 8. Status post PTCA.  OPERATIONS PERFORMED: Left heart cardiac catheterization by Dr. Kristeen Miss on 05/13/00.  HISTORY OF PRESENT ILLNESS:  This is a 77 year old woman known to our practice was admitted as an emergency on 05/12/00 because of chest pain.  She has known coronary disease and in 1989 underwent coronary artery bypass graft surgery by Dr. Particia Lather.  She was admitted in 4/00 with an acute inferior wall myocardial infarction secondary to acute graft occlusion secondary to right coronary artery occlusion and the graft was stented by Dr. Deborah Chalk as an emergency.  Patient had an adenosine Cardiolite stress test 9/00 showing distal septal and apical scar, inferior and apical hypokinesia, no reversible ischemia, and an ejection fraction slightly depressed at 48%.   On the day prior to admission, patient was shopping with a friend, got overheated and tired, then onset of chest pain after supper.  She was admitted to [facility] observation unit where initial enzymes were negative and she was transferred for further evaluation the following morning.  PHYSICAL EXAMINATION:  Vital signs:  Blood pressure on admission was 130/80, pulse 70 and regular. Color was good.  HEENT: Unremarkable.  CHEST:  Clear.  HEART:  No murmur, gallop, rub, or click.  ABDOMEN:  Soft and nontender.  Extremities:  Femoral pulses are good.  No peripheral edema.  She has good pedal pulses.  LABORATORY DATA:   Her initial electrocardiogram showed no acute change.  Blood work was unremarkable.  X-ray had been done at [facility] and was not repeated.  HOSPITAL COURSE:  The patient underwent serial enzyme evaluation.  We felt that she would need repeat cardiac catheterization and this was arranged by Dr. Kristeen Miss in Dr. Angelina Pih absence.  The cardiac catheterization showed that the stents were patent and in particular, the saphenous vein graft to the right coronary artery showed mild irregularity associated with the stent, but no stenosis and there was good flow and her ejection fraction was estimated at 50-60%.  It was felt that she would do well on continued medical therapy.  She was observed overnight and discharged the following morning. Fasting lipids on this admission showed a cholesterol of 189, HDL of 48, and a triglyceride of 43.  It was not a 14 hour fast, however, and we are going to repeat it at the office with a 14 hour fast to look again at the triglyceride.  Patients heart rhythm on telemetry remained stable.  She was able to be discharged home.  There were no post-catheterization complications.  DISCHARGE MEDICATIONS: 1. Celebrex 200 mg 1 daily for arthritis. 2. Protonix 40 mg 1 daily. 3. Ecotrin 325 mg 1 daily. 4. Valium 10 mg every 6 hours p.r.n. for nerves. 5. Premarin 0.625 mg daily. 6. Amitriptyline 25 mg at bedtime. 7. Toprol XL 50 once a day. 8. Lasix 20 mg daily. 9. K-Dur 20 once a day. 10. Prozac 20 mg daily. 11. Stool softener 2 each night. 12.  Nitrostat 150 p.r.n.  ACTIVITY:  She is to walk as tolerated.  DIET:  She is to be on a low cholesterol diet, avoiding sweets and chocolate.  FOLLOWUP:  She is to see Dr. Patty Sermons in three to four weeks for office visit and fasting lipid panel and she is to call for an appointment.  CONDITION ON DISCHARGE:  Improved. DD:  06/11/00 TD:  06/13/00 Job: [id number]

## 2011-03-20 NOTE — Op Note (Signed)
[name], PE              ACCOUNT NO.:  [id number]   MEDICAL RECORD NO.:  [id number]          PATIENT TYPE:  INP   LOCATION:  2302                         FACILITY:  [facility]   PHYSICIAN:  Evelene Croon, M.D.     DATE OF BIRTH:  06/02/1926   DATE OF PROCEDURE:  08/04/2006  DATE OF DISCHARGE:                                 OPERATIVE REPORT   PREOPERATIVE DIAGNOSES:  Severe native three-vessel coronary artery disease  and saphenous vein graft occlusions, status post coronary artery bypass  graft surgery in 1989.   POSTOPERATIVE DIAGNOSES:  Severe native three-vessel coronary artery disease  and saphenous vein graft occlusions, status post coronary artery bypass  graft surgery in 1989.   OPERATIVE PROCEDURES:  1. Redo median sternotomy, extracorporeal circulation, redo coronary      artery bypass graft surgery times three using a saphenous vein graft to      the obtuse marginal branch of the left circumflex coronary artery, a      saphenous vein graft to the distal right coronary artery, and a      saphenous vein graft to the acute marginal branch of the right coronary      artery.  2. Endoscopic vein harvesting from the right leg.   ATTENDING SURGEON:  Evelene Croon, M.D.   ASSISTANT:  Jerold Coombe, P.A.-C.   ANESTHESIA:  General endotracheal.   CLINICAL HISTORY:  This patient is a 77 year old woman, who underwent  coronary artery bypass x4 by Dr. Andrey Campanile in 1989.  She recently presented  with progressive substernal chest pain with exertion and at rest.  She had a  markedly positive Cardiolite scan on 07/23/2006, with an ejection fraction of  42%.  Catheterization showed severe native 3-vessel disease, with a patent  left internal mammary artery graft to the LAD, which was occluded  proximally.  All the saphenous vein grafts were occluded.  There was high-  grade proximal left circumflex stenosis before the large marginal branch.  This marginal branch had distal  disease in it.  The right coronary artery  was occluded and filled by collaterals from the left.  There was also a  diagonal occlusion, which had been grafted previously and filled by  collaterals.  This was a small vessel.  The patient had been on Plavix and  aspirin, and this was discontinued.  I discussed the operative procedure of  redo coronary artery bypass graft surgery with the patient, including  alternatives, benefits and risks, including bleeding, blood transfusion,  infection, stroke, myocardial infarction, graft failure and death.  She  understood and agreed to proceed.   DESCRIPTION OF OPERATIVE PROCEDURE:  The patient was taken to the operating  room and placed on the table in supine position.  After induction of general  endotracheal anesthesia, a Foley catheter was placed in the bladder using  sterile technique.  Then, the chest, abdomen and both lower extremities were  prepped and draped in the usual sterile manner.  The chest was then entered  through the previous median sternotomy incision.  The sternal wires were  removed and the sternum  opened using the oscillating saw without difficulty.  Bone hooks were used to retract the sternum and the mediastinal structures  dissected from the back of the sternum.  The chest retractor was placed and  dissection was performed to expose the right atrium and the ascending aorta.  At the same time, a segment of greater saphenous vein was harvested from the  left leg using endoscopic vein harvest technique.  This vein was of medium  size and good quality.   Then, the patient was heparinized, and when an adequate activated clotting  time was achieved, the distal ascending aorta was cannulated using a 20-  [place] aortic cannula for arterial inflow.  Venous outflow was achieved  using a 2-stage venous cannula to the right atrial appendage.  An antegrade  cardioplegia cannula was inserted in the aortic root.  A retrograde  cardioplegia  cannula was inserted through the right atrium and into the  coronary sinus.   The patient was placed on cardiopulmonary bypass and the remainder of the  heart was dissected from the pericardium.  Then, the distal coronary  arteries were identified.  The LAD had been previously grafted with a left  internal mammary graft and this was widely patent.  The LAD was traced  backwards, and the internal mammary artery identified as it entered the  pericardium.  It was encircled, and care was taken not to injure it.  The  diagonal branch was a small, diffusely diseased vessel and not felt to be  graftable.  It was fed by collaterals on the arteriogram.  The obtuse  marginal was a large vessel.  It was diffusely diseased, and this disease  extended out into the distal vessel.  There was an area in the midportion  that was suitable for grafting.  The right coronary artery was identified  distally.  It also was a diffusely diseased vessel, but felt to be  graftable.  There was a small posterior descending and a small  posterolateral branch, neither of which was of graftable size itself.   Then, the aorta was crossclamped and 500 cc of cold-blood antegrade  cardioplegia was administered into the aortic root with quick arrest of the  heart.  An atraumatic vascular bulldog was placed across the mammary  pedicle.  Then, 500 cc of cold-blood retrograde cardioplegia was given.  Systemic hypothermia to 28 degrees Centigrade and topical hypothermia with  slush saline was used.  A temperature probe was placed on the septum and an  insulated pad on the pericardium.   Then, the first distal anastomosis was performed to the obtuse marginal  branch.  The internal diameter was about 1.75 mm.  The conduit used was a  segment of greater saphenous vein.  The anastomosis was performed in an end-  to-side manner using continuous 7-0 Prolene suture.  Flow was measured through the graft and was excellent.   A second  distal anastomosis was performed to the distal right coronary  artery.  The internal diameter of this vessel was about 1.75 mm.  The  conduit used was a second segment of greater saphenous vein, and the  anastomosis performed in an end-to-side manner using continuous 7-0 Prolene  suture.  Flow was noted through the graft and was excellent. Then, another  dose of retrograde cardioplegia was given.   The third distal anastomosis was performed to the acute marginal branch.  The internal diameter was about 1.5 mm.  The conduit used was a third  segment of  greater saphenous vein.  The anastomosis was performed in an end-  to-side manner using continuous 7-0 Prolene suture.  Flow was noted through  the graft and was excellent.  Then, the patient was rewarmed to 37 degrees  Centigrade.  With the crossclamp in place, the 3 proximal vein graft  anastomoses were performed to the aortic root in an end-to-side manner using  continuous 6-0 Prolene suture.  Then, the clamp was removed from the mammary  pedicle.  There was rapid warming of the ventricular septum and return of  spontaneous ventricular fibrillation.  The crossclamp was removed after a  time of 94 minutes, and the patient spontaneously converted to sinus rhythm.   The proximal and distal anastomoses appeared hemostatic and the lie of the  grafts satisfactory.  Graft markers were placed around the proximal  anastomoses.  Two temporary right ventricular and right atrial pacing wires  were placed and brought out through the skin.   When the patient had rewarmed to 37 degrees Centigrade, she was weaned from  cardiopulmonary bypass on no inotropic agents.  The total bypass time was  123 minutes.  Cardiac function appeared excellent, and the cardiac output,  3.5 L per minute.  Protamine was given, and the venous and aortic cannulae  were removed without difficulty.  Hemostasis was achieved.  Two chest tubes  were placed with 2 in the posterior  pericardium and 1 in the anterior  mediastinum.  The sternum was then closed with #6 double stainless steel  wires.  The fascia was closed with continuous #1 Vicryl suture.  The  subcutaneous tissue was closed with continuous 2-0 Vicryl, and the skin with  a 3-0 Vicryl subcuticular closure.  The lower extremity vein harvest site  was closed in layers in a similar manner.  The sponge, needle and instrument  counts were correct, performed by the scrub nurse.  Dry sterile dressings  were applied over the incision and around the chest tubes, hooked to Pleur-  evac suction. The patient remained hemodynamically stable and was  transported to the SICU in guarded, but stable condition.      Evelene Croon, M.D.  Electronically Signed     BB/MEDQ  D:  08/04/2006  T:  08/04/2006  Job:  [id number]   cc:   Cassell Clement, M.D.  Cardiac Cath Lab  Evelene Croon, M.D.

## 2011-03-20 NOTE — Consult Note (Signed)
[name], KRAS              ACCOUNT NO.:  [id number]   MEDICAL RECORD NO.:  [id number]          PATIENT TYPE:  INP   LOCATION:  2016                         FACILITY:  [facility]   PHYSICIAN:  Evelene Croon, M.D.     DATE OF BIRTH:  [birth year]   DATE OF CONSULTATION:  07/29/2006  DATE OF DISCHARGE:                                   CONSULTATION   REFERRING PHYSICIANS:  1. Colleen Can. Deborah Chalk, M.D.  2. Cassell Clement, M.D.   REASON FOR CONSULTATION:  Severe native three-vessel coronary artery disease  and saphenous vein graft occlusions, status post coronary bypass graft  surgery in 1989.   CLINICAL HISTORY:  I was asked to evaluate this patient by Dr. Deborah Chalk and  Dr. Patty Sermons for consideration of redo coronary artery bypass graft  surgery.  She is a very pleasant 77 year old woman with a history of  coronary bypass x4 by Dr. Andrey Campanile on August 31, 1988.  She had a left  internal mammary graft to the LAD, and saphenous vein grafts to diagonal,  obtuse marginal, and right coronary arteries.  She has done well over the  years but did undergo stenting of a saphenous vein graft to the right  coronary artery around 2000.  She now presents with a several-month history  of progressive substernal chest pain radiating into her right arm.  This has  been occurring with less and less exertion and she has had some episodes  while at rest.  She said that since she has been started on Imdur, her pain  has been improved.  She underwent a stress Cardiolite exam that was markedly  abnormal with anterior apical reversible ischemia as well as inferior  ischemia.  Her ejection fraction was about 42%.  She underwent cardiac  catheterization yesterday which showed severe native three-vessel coronary  artery disease.   The LAD was occluded.  The LAD had a patent left internal mammary graft.  There was a diagonal branch that was occluded that filled by collaterals.  The saphenous vein graft to the  diagonal was occluded.  The left circumflex  had sequential 90% diffuse proximal disease.  The saphenous vein graft to  the left circumflex was occluded.  There is a large marginal branch.  The  right coronary artery had ostial dampening with about 80% proximal stenosis  and then 100% mid vessel occlusion.  Saphenous vein graft to the right  coronary was occluded.  There is a large acute marginal branch that was  compromised by this proximal right coronary stenosis.  Left ventricular  ejection fraction was about 40% with mild mitral regurgitation noted.   REVIEW OF SYSTEMS:  GENERAL:  She denies any fever, chills.  She has had  some fatigue.  She has had no weight loss.  EYES:  Negative.  ENT:  Negative.  ENDOCRINE:  She denies diabetes and hypothyroidism.  CARDIOVASCULAR:  As above.  She has had substernal chest pain with exertion  and some at rest.  She denies exertional dyspnea.  She has had no PND or  orthopnea.  RESPIRATORY:  She denies cough and  sputum production.  GI:  She  does have reflux.  She denies any nausea or vomiting.  She has had no melena  or bright red blood per rectum.  GU:  She denies dysuria and hematuria.  MUSCULOSKELETAL:  She does have osteoarthritis, especially in her hands.  NEUROLOGICAL:  She has chronic headaches but denies any focal weakness or  numbness.  She denies dizziness and syncope.  PSYCHIATRIC:  She does have  some anxiety and depression that she relates to the loss of her husband  about 3 years ago.  VASCULAR:  She denies any claudication or phlebitis.  She had her right saphenous vein harvested with her previous surgery.   ALLERGIES:  NONE.   PAST MEDICAL HISTORY:  1. Coronary bypass x4 in 1989.  2. She is status post cholecystectomy.  3. Status post hysterectomy in 1993.  4. Status post surgery for small-bowel obstruction in the past.  5. She is status post laser surgery on her right eye in the past.  6. She has history of hypertension.    SOCIAL HISTORY:  She is widowed for the past 3 years and lives by herself in  [place], [place].  She has no children.  She has a brother who lives  in [place], [place] and a sister who lives and [place], both of  whom are in poor health.  She is a remote smoker and denies alcohol abuse.   FAMILY HISTORY:  Positive for cardiac disease.  Her father died at a young  age from an accident.  Mother died of coronary disease and stroke.  Her  sister has had a coronary bypass surgery and her brother has had coronary  bypass surgery.   MEDICATIONS:  Are as listed on the medication reconciliation performed.  Of  significance, is that she has been on aspirin and Plavix long term.  her  last dose of Plavix was on July 28, 2006.   PHYSICAL EXAMINATION:  VITAL SIGNS:  Her blood pressure is 110/58.  Her  pulse is 56 and regular.  Respirations are 20 and unlabored.  GENERAL:  She is 128.9 pounds or 58.5 kg and 65 inches tall.  She is a well-  developed elderly white female who appears younger than her stated age.  HEENT:  Shows her to be normocephalic and atraumatic.  Pupils are equal and  reactive to light and accommodation.  Extraocular muscles are intact.  Her  throat is clear.  NECK:  Shows normal carotid pulses bilaterally.  There are no bruits.  There  is no adenopathy or thyromegaly.  CARDIAC:  Shows a regular rate and rhythm with normal S1-S2.  There is no  murmur, rub, or gallop.  There is a well-healed sternotomy incision.  LUNGS:  Clear.  ABDOMEN:  Shows active bowel sounds.  Abdomen is soft and nontender.  There  are no palpable masses or organomegaly.  There is an old laparotomy scar.  EXTREMITIES:  Show no peripheral edema.  Pedal pulses are palpable  bilaterally.  There is an old right leg saphenous vein harvest incision from  the ankle to just above the right knee.  There is some ecchymosis in the  right groin but no hematoma. NEUROLOGIC:  Shows her to be alert and  oriented x3.  Motor and sensory exams  grossly normal.  SKIN:  Warm and dry.   LABORATORY:  Examination shows a hemoglobin of 12.5, platelet count 226,000.  Coagulation profile is normal.  Her BUN is  23, and her creatinine 1.1.  Glucose is 88.   IMPRESSION:  Mrs. Saleeby has severe native three-vessel coronary disease and  saphenous vein graft occlusions with progressive anginal symptoms have a  markedly abnormal stress test.   I agree that redo coronary bypass graft surgery is the best treatment to  prevent further ischemia and infarction and improve her quality of life.  She has been on aspirin and Plavix and said that she has been bleeding  profusely with any small injury and I think we would be best waiting until  next Wednesday to do her surgery, especially since this is a redo surgery.  She is to have an echocardiogram to evaluate her valves and we will do  ultrasound of her left saphenous vein to be sure that it is suitable.  A  decision will have to be made whether she needs to stay in the hospital  until surgery or can go home.  That will be up to Dr. Patty Sermons.  I  discussed the operative procedure of redo  coronary bypass surgery with her including alternatives, benefits, and risks  including but not limited to bleeding, blood transfusion, infection, stroke,  myocardial infarction, graft failure, and death.  She understands and would  like to proceed with surgery.      Evelene Croon, M.D.  Electronically Signed     BB/MEDQ  D:  07/29/2006  T:  07/31/2006  Job:  [id number]   cc:   Cassell Clement, M.D.

## 2011-04-14 ENCOUNTER — Other Ambulatory Visit: Payer: Self-pay | Admitting: Cardiology

## 2011-04-14 DIAGNOSIS — F419 Anxiety disorder, unspecified: Secondary | ICD-10-CM

## 2011-04-14 MED ORDER — ALPRAZOLAM 0.25 MG PO TABS: 0.2500 mg | ORAL_TABLET | Freq: Two times a day (BID) | ORAL | Status: AC | PRN

## 2011-04-14 NOTE — Telephone Encounter (Signed)
Is she still suppose to be taking this? (takes valium also)

## 2011-04-14 NOTE — Telephone Encounter (Signed)
She should not be on both Xanax and diazepam

## 2011-04-14 NOTE — Telephone Encounter (Signed)
XANAX REFILL TO [facility]

## 2011-04-15 ENCOUNTER — Encounter: Payer: Self-pay | Admitting: Cardiology

## 2011-05-18 ENCOUNTER — Other Ambulatory Visit: Payer: Self-pay | Admitting: Cardiology

## 2011-05-18 DIAGNOSIS — K219 Gastro-esophageal reflux disease without esophagitis: Secondary | ICD-10-CM

## 2011-05-18 DIAGNOSIS — I119 Hypertensive heart disease without heart failure: Secondary | ICD-10-CM

## 2011-06-02 ENCOUNTER — Emergency Department (HOSPITAL_COMMUNITY): Payer: Medicare Other

## 2011-06-02 ENCOUNTER — Emergency Department (HOSPITAL_COMMUNITY)
Admission: EM | Admit: 2011-06-02 | Discharge: 2011-06-02 | Disposition: A | Discharge status: Home / Self Care | Payer: Medicare Other | Attending: Emergency Medicine | Admitting: Emergency Medicine

## 2011-06-02 DIAGNOSIS — E789 Disorder of lipoprotein metabolism, unspecified: Secondary | ICD-10-CM | POA: Insufficient documentation

## 2011-06-02 DIAGNOSIS — F329 Major depressive disorder, single episode, unspecified: Secondary | ICD-10-CM | POA: Insufficient documentation

## 2011-06-02 DIAGNOSIS — I252 Old myocardial infarction: Secondary | ICD-10-CM | POA: Insufficient documentation

## 2011-06-02 DIAGNOSIS — F0390 Unspecified dementia without behavioral disturbance: Secondary | ICD-10-CM | POA: Insufficient documentation

## 2011-06-02 DIAGNOSIS — I10 Essential (primary) hypertension: Secondary | ICD-10-CM | POA: Insufficient documentation

## 2011-06-02 DIAGNOSIS — R072 Precordial pain: Secondary | ICD-10-CM | POA: Insufficient documentation

## 2011-06-02 DIAGNOSIS — F3289 Other specified depressive episodes: Secondary | ICD-10-CM | POA: Insufficient documentation

## 2011-06-02 LAB — COMPREHENSIVE METABOLIC PANEL
ALT: 18 U/L (ref 0–35)
AST: 18 U/L (ref 0–37)
Alkaline Phosphatase: 67 U/L (ref 39–117)
CO2: 29 mEq/L (ref 19–32)
Calcium: 9 mg/dL (ref 8.4–10.5)
GFR calc Af Amer: >60 mL/min (ref >60)
Glucose, Bld: 114 mg/dL — ABNORMAL HIGH (ref 70–99)
Potassium: 3.6 mEq/L (ref 3.5–5.1)
Sodium: 141 mEq/L (ref 135–145)
Total Protein: 6.1 g/dL (ref 6.0–8.3)

## 2011-06-02 LAB — CK TOTAL AND CKMB (NOT AT ARMC)
CK, MB: 4.4 ng/mL — ABNORMAL HIGH (ref 0.3–4.0)
CK, MB: 4.1 ng/mL — ABNORMAL HIGH (ref 0.3–4.0)
Relative Index: 4.1 — ABNORMAL HIGH (ref 0.0–2.5)
Total CK: 121 U/L (ref 7–177)

## 2011-06-02 LAB — DIFFERENTIAL
Lymphocytes Relative: 20 % (ref 12–46)
Monocytes Absolute: 0.7 10*3/uL (ref 0.1–1.0)
Monocytes Relative: 9 % (ref 3–12)
Neutro Abs: 5.6 10*3/uL (ref 1.7–7.7)
Neutrophils Relative %: 69 % (ref 43–77)

## 2011-06-02 LAB — CBC
HCT: 36.4 % (ref 36.0–46.0)
Hemoglobin: 11.9 g/dL — ABNORMAL LOW (ref 12.0–15.0)
MCH: 27.7 pg (ref 26.0–34.0)
MCHC: 32.7 g/dL (ref 30.0–36.0)
RBC: 4.3 MIL/uL (ref 3.87–5.11)

## 2011-06-02 LAB — TROPONIN I: Troponin I: <0.3 ng/mL (ref <0.30)

## 2011-06-03 ENCOUNTER — Telehealth: Payer: Self-pay | Admitting: Cardiology

## 2011-06-03 NOTE — Telephone Encounter (Signed)
[place] [employer] called wanted to know about treatment plan, her meds, concerns about recent falls, meds making her nauseated and dizzy

## 2011-06-03 NOTE — Telephone Encounter (Signed)
Left message for [facility] to fax appropriate papers concerning their previous call. Left fax and office number.

## 2011-06-17 ENCOUNTER — Other Ambulatory Visit: Payer: Self-pay | Admitting: Cardiology

## 2011-06-17 DIAGNOSIS — F419 Anxiety disorder, unspecified: Secondary | ICD-10-CM

## 2011-06-17 DIAGNOSIS — I480 Paroxysmal atrial fibrillation: Secondary | ICD-10-CM

## 2011-06-17 DIAGNOSIS — I259 Chronic ischemic heart disease, unspecified: Secondary | ICD-10-CM

## 2011-06-17 DIAGNOSIS — E7800 Pure hypercholesterolemia, unspecified: Secondary | ICD-10-CM

## 2011-06-17 DIAGNOSIS — D649 Anemia, unspecified: Secondary | ICD-10-CM

## 2011-06-17 DIAGNOSIS — F329 Major depressive disorder, single episode, unspecified: Secondary | ICD-10-CM

## 2011-06-18 ENCOUNTER — Other Ambulatory Visit (INDEPENDENT_AMBULATORY_CARE_PROVIDER_SITE_OTHER): Payer: Medicare Other | Admitting: *Deleted

## 2011-06-18 ENCOUNTER — Encounter: Payer: Self-pay | Admitting: Cardiology

## 2011-06-18 ENCOUNTER — Ambulatory Visit (INDEPENDENT_AMBULATORY_CARE_PROVIDER_SITE_OTHER): Payer: Medicare Other | Admitting: Cardiology

## 2011-06-18 VITALS — BP 130/70 | HR 76 | Wt 115.0 lb

## 2011-06-18 DIAGNOSIS — F329 Major depressive disorder, single episode, unspecified: Secondary | ICD-10-CM

## 2011-06-18 DIAGNOSIS — I480 Paroxysmal atrial fibrillation: Secondary | ICD-10-CM

## 2011-06-18 DIAGNOSIS — F32A Depression, unspecified: Secondary | ICD-10-CM

## 2011-06-18 DIAGNOSIS — E7800 Pure hypercholesterolemia, unspecified: Secondary | ICD-10-CM

## 2011-06-18 DIAGNOSIS — D649 Anemia, unspecified: Secondary | ICD-10-CM

## 2011-06-18 DIAGNOSIS — I4891 Unspecified atrial fibrillation: Secondary | ICD-10-CM

## 2011-06-18 DIAGNOSIS — I259 Chronic ischemic heart disease, unspecified: Secondary | ICD-10-CM

## 2011-06-18 DIAGNOSIS — F419 Anxiety disorder, unspecified: Secondary | ICD-10-CM

## 2011-06-18 DIAGNOSIS — I119 Hypertensive heart disease without heart failure: Secondary | ICD-10-CM

## 2011-06-18 DIAGNOSIS — F411 Generalized anxiety disorder: Secondary | ICD-10-CM

## 2011-06-18 LAB — CBC WITH DIFFERENTIAL/PLATELET
Basophils Absolute: 0.1 10*3/uL (ref 0.0–0.1)
Basophils Relative: 0.7 % (ref 0.0–3.0)
Eosinophils Relative: 2.2 % (ref 0.0–5.0)
HCT: 37.3 % (ref 36.0–46.0)
Hemoglobin: 12.5 g/dL (ref 12.0–15.0)
Lymphocytes Relative: 18.7 % (ref 12.0–46.0)
Lymphs Abs: 1.7 10*3/uL (ref 0.7–4.0)
Monocytes Relative: 7.1 % (ref 3.0–12.0)
Neutro Abs: 6.6 10*3/uL (ref 1.4–7.7)
RBC: 4.37 Mil/uL (ref 3.87–5.11)
RDW: 14.1 % (ref 11.5–14.6)
WBC: 9.2 10*3/uL (ref 4.5–10.5)

## 2011-06-18 LAB — BASIC METABOLIC PANEL
BUN: 23 mg/dL (ref 6–23)
CO2: 31 mEq/L (ref 19–32)
Calcium: 9.2 mg/dL (ref 8.4–10.5)
Chloride: 101 mEq/L (ref 96–112)
Creatinine, Ser: 1 mg/dL (ref 0.4–1.2)
Glucose, Bld: 104 mg/dL — ABNORMAL HIGH (ref 70–99)

## 2011-06-18 LAB — HEPATIC FUNCTION PANEL
ALT: 20 U/L (ref 0–35)
Albumin: 4.1 g/dL (ref 3.5–5.2)
Bilirubin, Direct: 0.2 mg/dL (ref 0.0–0.3)
Total Protein: 6.6 g/dL (ref 6.0–8.3)

## 2011-06-18 LAB — T4, FREE: Free T4: 1.05 ng/dL (ref 0.60–1.60)

## 2011-06-18 NOTE — Progress Notes (Signed)
[name] Date of Birth:  07-30-1926 [facility] [address]   [address] [place], [place]  [address] [phone]           Fax   [phone]  History of Present Illness: This very pleasant 77 year old woman is seen for a scheduled 4 month followup office visit.  She has a history of ischemic heart disease.  She had initial coronary artery bypass grafting in 1989.  She had redo coronary artery bypass grafting in October 2007.  Since then she has had several further episodes of chest pain consistent with ischemia which have been managed medically.  She has frequent episodes of chest pain relieved by nitroglycerin.  Her most recent hospitalization was in 12/25/10.  During that admission she initially was in nature fibrillation but converted to normal sinus rhythm.  Her echocardiogram on 12/27/10 showed an ejection fraction of 45% with akinesis of the basal inferior and inferoseptal myocardium and she was found to have grade 2 diastolic dysfunction.  The patient has a history of hypercholesterolemia and a history of anxiety.  In addition she is legally blind.  She no longer drives a car.  She still lives alone in her old home and friends helped by bringing in groceries etc.  Current Outpatient Prescriptions  Medication Sig Dispense Refill  . amiodarone (PACERONE) 200 MG tablet Take 200 mg by mouth daily.        Marland Kitchen aspirin 81 MG tablet Take 81 mg by mouth daily.        Marland Kitchen atorvastatin (LIPITOR) 20 MG tablet Take 20 mg by mouth daily.        . clopidogrel (PLAVIX) 75 MG tablet Take 1 tablet (75 mg total) by mouth daily.  30 tablet  5  . diltiazem (TIAZAC) 180 MG 24 hr capsule Take 1 capsule (180 mg total) by mouth daily.  30 capsule  5  . ferrous gluconate (FERGON) 246 (28 FE) MG tablet Take 27 mg by mouth daily with breakfast.        . furosemide (LASIX) 20 MG tablet Take 20 mg by mouth daily. Taking on Mon. Wed. And Fri only      . isosorbide mononitrate (IMDUR) 60  MG 24 hr tablet Take 30 mg by mouth at bedtime.        . metoprolol succinate (TOPROL-XL) 25 MG 24 hr tablet TAKE 1 TABLET BY MOUTH TWICE A DAY  180 tablet  3  . nitroGLYCERIN (NITROSTAT) 0.4 MG SL tablet Place 0.4 mg under the tongue as needed.        . pantoprazole (PROTONIX) 40 MG tablet TAKE 1 TABLET BY MOUTH EVERY DAY  90 tablet  3  . DISCONTD: amiodarone (PACERONE) 200 MG tablet Take 1 tablet (200 mg total) by mouth 1 dose over 46 hours.  30 tablet  5    Allergies  Allergen Reactions  . Lopressor (Metoprolol Tartrate)     alopecia    Patient Active Problem List  Diagnoses  . Paroxysmal atrial fibrillation  . Ischemic heart disease  . Failed CABG (coronary artery bypass graft)  . Hypercholesterolemia  . Anxiety  . Depression  . Anemia    History  Smoking status  . Former Smoker  Smokeless tobacco  . Not on file    History  Alcohol Use No    No family history on file.  Review of Systems: Constitutional: no fever chills diaphoresis or fatigue or change in weight.  Head and neck:  no hearing loss, no epistaxis, no photophobia or visual disturbance. Respiratory: No cough, shortness of breath or wheezing. Cardiovascular: No chest pain peripheral edema, palpitations. Gastrointestinal: No abdominal distention, no abdominal pain, no change in bowel habits hematochezia or melena. Genitourinary: No dysuria, no frequency, no urgency, no nocturia. Musculoskeletal:No arthralgias, no back pain, no gait disturbance or myalgias. Neurological: No dizziness, no headaches, no numbness, no seizures, no syncope, no weakness, no tremors. Hematologic: No lymphadenopathy, no easy bruising. Psychiatric: No confusion, no hallucinations, no sleep disturbance.    Physical Exam: Filed Vitals:   06/18/11 0905  BP: 130/70  Pulse: 76  The general appearance feels a well-developed well-nourished woman who appears younger than her stated age.Pupils equal and reactive.   Extraocular  Movements are full.  There is no scleral icterus.  The mouth and pharynx are normal.  The neck is supple.  The carotids reveal no bruits.  The jugular venous pressure is normal.  The thyroid is not enlarged.  There is no lymphadenopathy.  The chest is clear to percussion and auscultation. There are no rales or rhonchi. Expansion of the chest is symmetrical.  The precordium is quiet.  The first heart sound is normal.  The second heart sound is physiologically split.  There is no murmur gallop rub or click.  There is no abnormal lift or heave.    The breasts reveal no massesThe abdomen is soft and nontender. Bowel sounds are normal. The liver and spleen are not enlarged. There Are no abdominal masses. There are no bruits.   normal extremity without phlebitis or edema.  Pedal pulses are present.  She does have a bruise on her left leg from where she fell recently.  There is no evidence of deep vein thrombosis. Strength is normal and symmetrical in all extremities.  There is no lateralizing weakness.  There are no sensory deficits.  The skin is warm and dry.  There is no rash.     Assessment / Plan: Continue same medication.  Recheck in 4 months for followup office visit and lab work

## 2011-06-18 NOTE — Assessment & Plan Note (Signed)
The patient remains on amiodarone 200 mg daily.  She has had no recurrence of her atrial fibrillation.  She's not having any symptoms of hypothyroidism from the amiodarone.

## 2011-06-18 NOTE — Assessment & Plan Note (Signed)
The patient has a past history of ischemic heart disease.  She continues to have intermittent episodes of chest discomfort consistent with angina pectoris relieved by nitroglycerin.  She is not interested in any further invasive procedures at this time .

## 2011-06-18 NOTE — Assessment & Plan Note (Signed)
The patient has a history of mixed anxiety and depression.  She is depressed about living alone and about her poor eyesight and her failing health.  She is also been experiencing difficulty sleeping because of night sweats.  She states that the sweats are just like menopausal night sweats.  She has not seen her gynecologist recently and I suggested that she have her gynecologist see her and make some recommendations for her night sweats symptoms

## 2011-06-22 ENCOUNTER — Telehealth: Payer: Self-pay | Admitting: *Deleted

## 2011-06-22 NOTE — Telephone Encounter (Signed)
Message copied by Eugenia Pancoast on Mon Jun 22, 2011 11:57 AM ------      Message from: Cassell Clement      Created: Thu Jun 18, 2011  7:41 PM       Please report.  The blood tests are all okay.  Thyroid and CBC are normal.  Continue on same medication.The blood sugar is borderline elevated at 104 which is improved from last time

## 2011-06-22 NOTE — Telephone Encounter (Signed)
Advised of labs 

## 2011-07-20 ENCOUNTER — Other Ambulatory Visit: Payer: Self-pay | Admitting: Cardiology

## 2011-07-20 ENCOUNTER — Encounter: Payer: Self-pay | Admitting: Cardiology

## 2011-07-20 MED ORDER — ISOSORBIDE MONONITRATE ER 60 MG PO TB24: ORAL_TABLET | ORAL | Status: DC

## 2011-07-20 NOTE — Telephone Encounter (Signed)
iosobide refill, uses [facility]

## 2011-07-20 NOTE — Telephone Encounter (Signed)
Sent to pharmacy 

## 2011-07-20 NOTE — Telephone Encounter (Signed)
Error

## 2011-07-21 ENCOUNTER — Other Ambulatory Visit: Payer: Self-pay | Admitting: Cardiology

## 2011-07-21 DIAGNOSIS — I2510 Atherosclerotic heart disease of native coronary artery without angina pectoris: Secondary | ICD-10-CM

## 2011-07-22 ENCOUNTER — Other Ambulatory Visit: Payer: Self-pay | Admitting: Cardiology

## 2011-07-22 NOTE — Telephone Encounter (Signed)
escribe request  

## 2011-07-23 NOTE — Telephone Encounter (Signed)
Refilled Meds from fax  

## 2011-08-04 ENCOUNTER — Telehealth: Payer: Self-pay | Admitting: *Deleted

## 2011-08-04 NOTE — Telephone Encounter (Signed)
Received a call from home health nurse Clydie Braun and patient fell.  Called and spoke with patient and she is having a hard time walking.  Hip hurts real bad per patient, advised she needs to go to emergency department.

## 2011-08-04 NOTE — Telephone Encounter (Signed)
Agree with advice given

## 2011-08-14 LAB — CBC
HCT: 38.2
MCHC: 34.1
MCV: 85.1
Platelets: 182
RBC: 4.82
RDW: 13.3

## 2011-08-14 LAB — CARDIAC PANEL(CRET KIN+CKTOT+MB+TROPI)
Relative Index: 1.8
Total CK: 164
Troponin I: 0.01
Troponin I: 0.02

## 2011-08-14 LAB — DIFFERENTIAL
Basophils Absolute: 0
Basophils Relative: 1
Lymphocytes Relative: 31
Monocytes Relative: 8
Neutro Abs: 2.8
Neutrophils Relative %: 58

## 2011-08-14 LAB — PROTIME-INR: INR: 1

## 2011-08-14 LAB — I-STAT 8, (EC8 V) (CONVERTED LAB)
Bicarbonate: 29.3 — ABNORMAL HIGH
Glucose, Bld: 99
TCO2: 31
pH, Ven: 7.357 — ABNORMAL HIGH

## 2011-08-14 LAB — POCT CARDIAC MARKERS
CKMB, poc: 1.2
Myoglobin, poc: 122

## 2011-08-14 LAB — TROPONIN I: Troponin I: 0.02

## 2011-08-14 LAB — T4: T4, Total: 6.1

## 2011-08-14 LAB — TSH: TSH: 5.045

## 2011-08-14 LAB — APTT: aPTT: 32

## 2011-09-04 ENCOUNTER — Other Ambulatory Visit: Payer: Self-pay | Admitting: *Deleted

## 2011-09-04 DIAGNOSIS — G4700 Insomnia, unspecified: Secondary | ICD-10-CM

## 2011-09-04 NOTE — Telephone Encounter (Signed)
Refilled meds per fax request.  

## 2011-09-05 MED ORDER — TEMAZEPAM 15 MG PO CAPS: 15.0000 mg | ORAL_CAPSULE | Freq: Every evening | ORAL | Status: DC | PRN

## 2011-10-20 ENCOUNTER — Ambulatory Visit: Payer: Medicare Other | Admitting: Cardiology

## 2011-10-20 ENCOUNTER — Other Ambulatory Visit: Payer: Medicare Other | Admitting: *Deleted

## 2011-10-31 IMAGING — CR DG CHEST 1V PORT
1 series · 1 of 1 positions shown · non-contrast
Comparison: Chest x-ray of 12/29/2010

CLINICAL DATA: Chest pain

PORTABLE CHEST - 1 VIEW

[view not recorded]
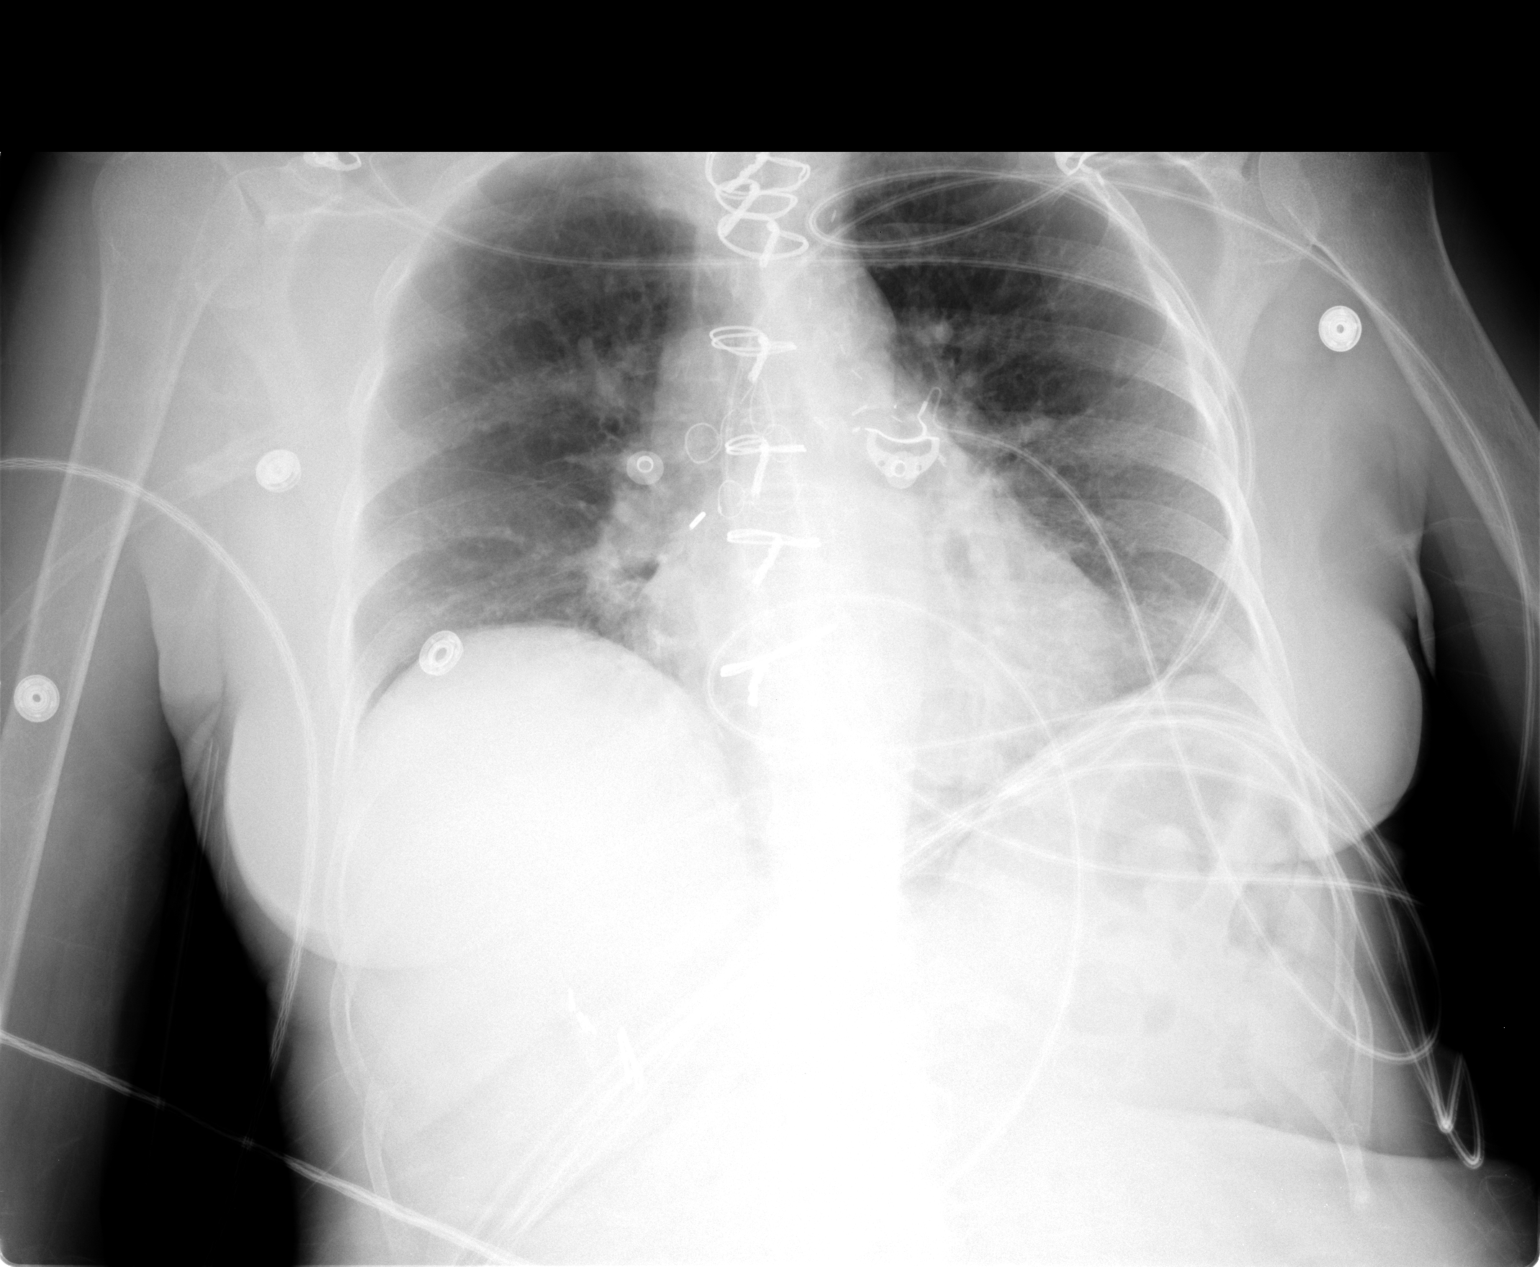

[1 of 1 positions shown; findings below may reference images not displayed]

FINDINGS: No definite active process is seen.  Minimally prominent
perihilar markings are noted with some peribronchial thickening
which may be seen with bronchitis.  The heart is mildly enlarged
and stable.   Median sternotomy sutures are noted.
IMPRESSION: No active infiltrate or effusion.  Mild peribronchial thickening.

## 2011-11-02 ENCOUNTER — Other Ambulatory Visit: Payer: Self-pay | Admitting: Cardiology

## 2011-11-02 MED ORDER — AMIODARONE HCL 200 MG PO TABS: 200.0000 mg | ORAL_TABLET | Freq: Every day | ORAL | Status: DC

## 2011-12-10 ENCOUNTER — Telehealth: Payer: Self-pay | Admitting: Cardiology

## 2011-12-10 NOTE — Telephone Encounter (Signed)
Number provided was a pre-authorization number which is needed by [facility].  Rx was authorized until 12/09/12.

## 2011-12-10 NOTE — Telephone Encounter (Signed)
Phone number listed was no longer in service.  I called pt who states Dr Patty Sermons prescribed temazepam.  She states [employer] needs this verified and provided a new phone number [phone].

## 2011-12-10 NOTE — Telephone Encounter (Signed)
New msg: Pt calling wanting [place] to call [employer] regarding pt sleeping pill. Please return pt call to discuss further.  [employer] [phone]

## 2011-12-17 ENCOUNTER — Telehealth: Payer: Self-pay | Admitting: Cardiology

## 2011-12-17 ENCOUNTER — Emergency Department (HOSPITAL_COMMUNITY): Payer: Medicare Other

## 2011-12-17 ENCOUNTER — Encounter (HOSPITAL_COMMUNITY): Payer: Self-pay | Admitting: Emergency Medicine

## 2011-12-17 ENCOUNTER — Other Ambulatory Visit: Payer: Self-pay

## 2011-12-17 ENCOUNTER — Inpatient Hospital Stay (HOSPITAL_COMMUNITY)
Admission: EM | Admit: 2011-12-17 | Discharge: 2011-12-18 | DRG: 293 | Disposition: A | Discharge status: Home / Self Care | Payer: Medicare Other | Source: Ambulatory Visit | Attending: Cardiology | Admitting: Cardiology

## 2011-12-17 DIAGNOSIS — I2510 Atherosclerotic heart disease of native coronary artery without angina pectoris: Secondary | ICD-10-CM | POA: Diagnosis present

## 2011-12-17 DIAGNOSIS — H543 Unqualified visual loss, both eyes: Secondary | ICD-10-CM | POA: Diagnosis present

## 2011-12-17 DIAGNOSIS — I5043 Acute on chronic combined systolic (congestive) and diastolic (congestive) heart failure: Principal | ICD-10-CM | POA: Diagnosis present

## 2011-12-17 DIAGNOSIS — Z79899 Other long term (current) drug therapy: Secondary | ICD-10-CM

## 2011-12-17 DIAGNOSIS — I209 Angina pectoris, unspecified: Secondary | ICD-10-CM | POA: Diagnosis present

## 2011-12-17 DIAGNOSIS — I509 Heart failure, unspecified: Secondary | ICD-10-CM | POA: Diagnosis present

## 2011-12-17 DIAGNOSIS — I2589 Other forms of chronic ischemic heart disease: Secondary | ICD-10-CM | POA: Diagnosis present

## 2011-12-17 DIAGNOSIS — E785 Hyperlipidemia, unspecified: Secondary | ICD-10-CM | POA: Diagnosis present

## 2011-12-17 DIAGNOSIS — I10 Essential (primary) hypertension: Secondary | ICD-10-CM | POA: Diagnosis present

## 2011-12-17 DIAGNOSIS — Z87891 Personal history of nicotine dependence: Secondary | ICD-10-CM

## 2011-12-17 DIAGNOSIS — I255 Ischemic cardiomyopathy: Secondary | ICD-10-CM | POA: Diagnosis present

## 2011-12-17 DIAGNOSIS — Z8673 Personal history of transient ischemic attack (TIA), and cerebral infarction without residual deficits: Secondary | ICD-10-CM

## 2011-12-17 DIAGNOSIS — D649 Anemia, unspecified: Secondary | ICD-10-CM | POA: Diagnosis present

## 2011-12-17 DIAGNOSIS — F341 Dysthymic disorder: Secondary | ICD-10-CM | POA: Diagnosis present

## 2011-12-17 DIAGNOSIS — Z951 Presence of aortocoronary bypass graft: Secondary | ICD-10-CM

## 2011-12-17 DIAGNOSIS — I4891 Unspecified atrial fibrillation: Secondary | ICD-10-CM | POA: Diagnosis present

## 2011-12-17 DIAGNOSIS — E876 Hypokalemia: Secondary | ICD-10-CM

## 2011-12-17 LAB — CBC
HCT: 36 % (ref 36.0–46.0)
MCH: 26.9 pg (ref 26.0–34.0)
MCHC: 31.9 g/dL (ref 30.0–36.0)
RDW: 14.2 % (ref 11.5–15.5)

## 2011-12-17 LAB — POCT I-STAT TROPONIN I

## 2011-12-17 LAB — COMPREHENSIVE METABOLIC PANEL
Albumin: 3 g/dL — ABNORMAL LOW (ref 3.5–5.2)
Alkaline Phosphatase: 75 U/L (ref 39–117)
BUN: 22 mg/dL (ref 6–23)
Calcium: 9 mg/dL (ref 8.4–10.5)
Potassium: 4.3 mEq/L (ref 3.5–5.1)
Sodium: 138 mEq/L (ref 135–145)
Total Protein: 5.8 g/dL — ABNORMAL LOW (ref 6.0–8.3)

## 2011-12-17 LAB — PROTIME-INR: Prothrombin Time: 13.5 seconds (ref 11.6–15.2)

## 2011-12-17 LAB — APTT: aPTT: 32 seconds (ref 24–37)

## 2011-12-17 MED ORDER — ALPRAZOLAM 0.5 MG PO TABS
0.5000 mg | ORAL_TABLET | Freq: Once | ORAL | Status: AC
  Administered 2011-12-17: 0.5 mg via ORAL
  Filled 2011-12-17: qty 1

## 2011-12-17 MED ORDER — HEPARIN (PORCINE) IN NACL 100-0.45 UNIT/ML-% IJ SOLN
12.0000 [IU]/kg/h | Freq: Once | INTRAMUSCULAR | Status: AC
  Administered 2011-12-17: 12 [IU]/kg/h via INTRAVENOUS
  Filled 2011-12-17: qty 250

## 2011-12-17 MED ORDER — NITROGLYCERIN 0.4 MG SL SUBL: 0.4000 mg | SUBLINGUAL_TABLET | SUBLINGUAL | Status: DC | PRN

## 2011-12-17 MED ORDER — HEPARIN BOLUS VIA INFUSION
1500.0000 [IU] | Freq: Once | INTRAVENOUS | Status: AC
  Administered 2011-12-17: 1500 [IU] via INTRAVENOUS

## 2011-12-17 MED ORDER — FUROSEMIDE 10 MG/ML IJ SOLN
40.0000 mg | Freq: Once | INTRAMUSCULAR | Status: AC
  Administered 2011-12-17: 40 mg via INTRAVENOUS
  Filled 2011-12-17: qty 4

## 2011-12-17 NOTE — ED Notes (Signed)
States chest pain since 5pm yesterday. Hx of cardiac disease.

## 2011-12-17 NOTE — ED Provider Notes (Signed)
History     CSN: [id number]  Arrival date & time 12/17/11  1713   First MD Initiated Contact with Patient 12/17/11 1719      Chief Complaint  Patient presents with  . Chest Pain    chest pain since yesterday near 5pm. states cardiac history.    (Consider location/radiation/quality/duration/timing/severity/associated sxs/prior treatment) HPI Hx provided by the pt.  Pt arrived via EMS.  Hx by the pt limited 2/2 poor historian.  78 y.o. F with h/o CAD s/p CABG presenting with CC of CP.  Pt reports that her pain began gradual last nt and has been central, intermittent, with occasional radiation to her back, and with min SOB and no associated diaphoresis, nausea, cough, or fever.  Pt cannot describe the character well, but states is "just hurts" and is "angina."  Pt took nitro x 3 at home then ASA PTA with mild improvement.  No worsening factors.  Pt with h/o similar Sx's in the past.  Pt not seen recently for these complaints but called [facility] cards and was told to go to the ED for eval.   Past Medical History  Diagnosis Date  . Stroke   . Coronary artery disease   . Hypertension   . Hyperlipidemia     Past Surgical History  Procedure Date  . Coronary artery bypass graft 1989    redo 2007    History reviewed. No pertinent family history.  History  Substance Use Topics  . Smoking status: Former Games developer  . Smokeless tobacco: Not on file  . Alcohol Use: No    OB History    Grav Para Term Preterm Abortions TAB SAB Ect Mult Living                  Review of Systems  Constitutional: Negative for fever and chills.  HENT: Negative for nosebleeds, congestion and sore throat.   Eyes: Negative for pain and visual disturbance.  Respiratory: Positive for shortness of breath. Negative for cough and wheezing.   Cardiovascular: Positive for chest pain. Negative for palpitations.  Gastrointestinal: Negative for nausea, vomiting, abdominal pain and diarrhea.  Genitourinary:  Negative for dysuria and hematuria.  Musculoskeletal: Negative for back pain and gait problem.  Skin: Negative for rash and wound.  Neurological: Negative for dizziness and headaches.  Psychiatric/Behavioral: Negative for confusion and agitation.  All other systems reviewed and are negative.    Allergies  Lopressor  Home Medications   Current Outpatient Rx  Name Route Sig Dispense Refill  . AMIODARONE HCL 200 MG PO TABS Oral Take 1 tablet (200 mg total) by mouth daily. Or as Directed 45 tablet 6  . ASPIRIN 81 MG PO TABS Oral Take 81 mg by mouth daily.      Marland Kitchen DILTIAZEM HCL ER BEADS 180 MG PO CP24 Oral Take 1 capsule (180 mg total) by mouth daily. 30 capsule 5  . FERROUS GLUCONATE IRON 246 (28 FE) MG PO TABS Oral Take 27 mg by mouth daily with breakfast.      . FUROSEMIDE 20 MG PO TABS Oral Take 20 mg by mouth daily. Taking on Mon. Wed. And Fri only    . ISOSORBIDE MONONITRATE ER 60 MG PO TB24  1/2 tablet at bedtime or as directed 30 tablet 11  . METOPROLOL SUCCINATE ER 25 MG PO TB24  TAKE 1 TABLET BY MOUTH TWICE A DAY 180 tablet 3  . NITROGLYCERIN 0.4 MG SL SUBL Sublingual Place 0.4 mg under the tongue as needed.      Marland Kitchen  PANTOPRAZOLE SODIUM 40 MG PO TBEC  TAKE 1 TABLET BY MOUTH EVERY DAY 90 tablet 3    BP 123/98  Pulse 60  Temp(Src) 98.2 F (36.8 C) (Oral)  Resp 12  Ht 5\' 3"  (1.6 m)  Wt 95 lb (43.092 kg)  BMI 16.83 kg/m2  SpO2 95%  Physical Exam  Nursing note and vitals reviewed. Constitutional: She is oriented to person, place, and time. No distress.       Thin, alert  HENT:  Head: Normocephalic and atraumatic.  Right Ear: External ear normal.  Left Ear: External ear normal.  Nose: Nose normal.  Mouth/Throat: Oropharynx is clear and moist.  Eyes: Conjunctivae and EOM are normal. Pupils are equal, round, and reactive to light.  Neck: Normal range of motion. Neck supple.  Cardiovascular: Normal rate, regular rhythm and intact distal pulses.   Murmur (about 4/6 systolic  murmur) heard. Pulmonary/Chest: Effort normal. She has no wheezes. She has rales (diffuse). She exhibits no tenderness.  Abdominal: Soft. Bowel sounds are normal. There is no tenderness.  Musculoskeletal: Normal range of motion. She exhibits edema (bilateral LE 1-2+). She exhibits no tenderness.  Neurological: She is alert and oriented to person, place, and time.  Skin: Skin is warm and dry. She is not diaphoretic.  Psychiatric: Judgment normal. Her mood appears anxious (mildly).    ED Course  Procedures (including critical care time)   Date: 12/17/2011  Rate:  57  Rhythm: sinus brady  QRS Axis: normal  Intervals: normal  ST/T Wave abnormalities: no ST elevation; mildly greater TWI in lateral leads  Conduction Disutrbances: none    Labs Reviewed  CBC - Abnormal; Notable for the following:    Hemoglobin 11.5 (*)    All other components within normal limits  COMPREHENSIVE METABOLIC PANEL - Abnormal; Notable for the following:    Glucose, Bld 100 (*)    Total Protein 5.8 (*)    Albumin 3.0 (*)    AST 42 (*)    ALT 59 (*)    GFR calc non Af Amer 59 (*)    GFR calc Af Amer 69 (*)    All other components within normal limits  POCT I-STAT TROPONIN I  APTT  PROTIME-INR  POCT I-STAT TROPONIN I  HEPARIN LEVEL (UNFRACTIONATED)  CBC   Dg Chest Portable 1 View  12/17/2011  *RADIOLOGY REPORT*  Clinical Data: Chest pain.  PORTABLE CHEST - 1 VIEW  Comparison: Portable chest 06/02/2011.  Findings: The heart size is stable.  Diffuse interstitial pattern suggests edema.  Bibasilar airspace disease likely reflects like atelectasis.  The patient is status post median sternotomy.  IMPRESSION:  1.  Mild cardiomegaly with a new diffuse interstitial pattern suggesting edema and congestive heart failure. 2.  Bibasilar airspace disease likely reflects atelectasis.  Early infection is not excluded.  Original Report Authenticated By: Jamesetta Orleans. MATTERN, M.D.     1. Chest pain   2. CHF  exacerbation       MDM   78 y.o. F with h/o CAD with CABG here with CP and min SOB.  Exam as above, diffuse rales and LE edema.  ASA given and nitro with resolved pain.  EKG with mildly greater TWI laterally.  Initial Trop neg; creat, WBC WNL.  CXR with mild edema.  ACS possible; however, clinical picture c/f mild CHF exacerbation.  [facility] cards consulted and will admit; rec lasix 40mg  IV and min xanax dose 2/2 anxiety.  Pt admitted to cards.  Particia Lather, MD 12/18/11 [phone]

## 2011-12-17 NOTE — ED Notes (Signed)
Kay Holliday(niece) [phone])

## 2011-12-17 NOTE — ED Provider Notes (Addendum)
I saw and evaluated the patient, reviewed the resident's note and I agree with the findings and plan. 78 year old, female, with a history of coronary disease, presents to emergency department complaining of chest pain that radiates back into her back is constant throughout the day.  She denies nausea, vomiting, fevers, chills, cough.  She also states that she has had involuntary weight loss.  On examination.  She is in no distress.  Her lungs are clear and equal.  Her abdomen is benign.  She does have subtle T-wave changes in her lateral leads with negative cardiac enzymes.  She had spoken with her cardiologist, who recommended she come for evaluation.  So we will consult him for further evaluation and admission .  Nicholes Stairs, MD 12/17/11 1926  ED ECG REPORT   Date: 12/17/2011  EKG Time: 11:13 PM  Rate: 57  Rhythm: normal sinus rhythm,    Axis: nl  Intervals:none  ST&T Change: nonspecfic tw changes  Narrative Interpretation: sinus brady with ant q waves and nonspecific tw changes            Nicholes Stairs, MD 12/17/11 2314

## 2011-12-17 NOTE — Consult Note (Signed)
ANTICOAGULATION CONSULT NOTE - Initial Consult  Pharmacy Consult for Heparin Indication: chest pain/ACS  Allergies  Allergen Reactions  . Lopressor (Metoprolol Tartrate)     alopecia    Patient Measurements: Height: 5\' 3"  (160 cm) Weight: 95 lb (43.092 kg) IBW/kg (Calculated) : 52.4  Heparin Dosing Weight: 43.1kg  Vital Signs: Temp: 98.2 F (36.8 C) (02/14 1721) Temp src: Oral (02/14 1721) BP: 153/53 mmHg (02/14 1934) Pulse Rate: 67  (02/14 1934)  Labs:  Basename 12/17/11 1740  HGB 11.5*  HCT 36.0  PLT 211  APTT --  LABPROT --  INR --  HEPARINUNFRC --  CREATININE 0.87  CKTOTAL --  CKMB --  TROPONINI --   Estimated Creatinine Clearance: 32.8 ml/min (by C-G formula based on Cr of 0.87).  Medical History: Past Medical History  Diagnosis Date  . Stroke   . Coronary artery disease   . Hypertension   . Hyperlipidemia    Assessment: 86yof to begin heparin for CP. MD ordered 1500 unit bolus then drip at 500 units/hr (agree with dose).  Goal of Therapy:  Heparin level 0.3-0.7 units/ml   Plan:  1) Check 8h heparin level 2) Daily heparin level and CBC  Fredrik Rigger 12/17/2011,8:49 PM

## 2011-12-17 NOTE — Telephone Encounter (Signed)
New msg Pt said she has been having chest pain yesterday and today

## 2011-12-17 NOTE — Telephone Encounter (Signed)
Pt told to go to call 911 and go to the ER.  Rosann Auerbach was notified.

## 2011-12-18 ENCOUNTER — Other Ambulatory Visit: Payer: Self-pay

## 2011-12-18 ENCOUNTER — Encounter (HOSPITAL_COMMUNITY): Payer: Self-pay | Admitting: Internal Medicine

## 2011-12-18 DIAGNOSIS — E876 Hypokalemia: Secondary | ICD-10-CM

## 2011-12-18 DIAGNOSIS — I509 Heart failure, unspecified: Secondary | ICD-10-CM

## 2011-12-18 DIAGNOSIS — I255 Ischemic cardiomyopathy: Secondary | ICD-10-CM | POA: Diagnosis present

## 2011-12-18 DIAGNOSIS — R079 Chest pain, unspecified: Secondary | ICD-10-CM

## 2011-12-18 DIAGNOSIS — I5043 Acute on chronic combined systolic (congestive) and diastolic (congestive) heart failure: Secondary | ICD-10-CM | POA: Diagnosis present

## 2011-12-18 LAB — CARDIAC PANEL(CRET KIN+CKTOT+MB+TROPI)
CK, MB: 3.7 ng/mL (ref 0.3–4.0)
Relative Index: INVALID (ref 0.0–2.5)
Relative Index: INVALID (ref 0.0–2.5)
Total CK: 80 U/L (ref 7–177)
Total CK: 65 U/L (ref 7–177)
Troponin I: <0.3 ng/mL (ref <0.30)

## 2011-12-18 LAB — CBC
HCT: 37.3 % (ref 36.0–46.0)
MCHC: 31.9 g/dL (ref 30.0–36.0)
Platelets: 191 10*3/uL (ref 150–400)
RDW: 14.2 % (ref 11.5–15.5)

## 2011-12-18 LAB — BASIC METABOLIC PANEL
BUN: 17 mg/dL (ref 6–23)
GFR calc Af Amer: 73 mL/min — ABNORMAL LOW (ref >90)
GFR calc non Af Amer: 63 mL/min — ABNORMAL LOW (ref >90)
Potassium: 3.2 mEq/L — ABNORMAL LOW (ref 3.5–5.1)

## 2011-12-18 LAB — HEPARIN LEVEL (UNFRACTIONATED): Heparin Unfractionated: <0.1 IU/mL — ABNORMAL LOW (ref 0.30–0.70)

## 2011-12-18 MED ORDER — HEPARIN BOLUS VIA INFUSION
1000.0000 [IU] | Freq: Once | INTRAVENOUS | Status: AC
  Administered 2011-12-18: 1000 [IU] via INTRAVENOUS
  Filled 2011-12-18: qty 1000

## 2011-12-18 MED ORDER — ISOSORBIDE MONONITRATE ER 30 MG PO TB24
30.0000 mg | ORAL_TABLET | Freq: Every day | ORAL | Status: DC
  Filled 2011-12-18: qty 1

## 2011-12-18 MED ORDER — POTASSIUM CHLORIDE CRYS ER 20 MEQ PO TBCR: 20.0000 meq | EXTENDED_RELEASE_TABLET | Freq: Every day | ORAL | Status: DC

## 2011-12-18 MED ORDER — FUROSEMIDE 40 MG PO TABS: 40.0000 mg | ORAL_TABLET | Freq: Every day | ORAL | Status: DC

## 2011-12-18 MED ORDER — CLOPIDOGREL BISULFATE 75 MG PO TABS
75.0000 mg | ORAL_TABLET | Freq: Every day | ORAL | Status: DC
  Administered 2011-12-18: 75 mg via ORAL
  Filled 2011-12-18: qty 1

## 2011-12-18 MED ORDER — HEPARIN SOD (PORCINE) IN D5W 100 UNIT/ML IV SOLN
700.0000 [IU]/h | INTRAVENOUS | Status: DC
  Administered 2011-12-18: 500 [IU]/h via INTRAVENOUS
  Administered 2011-12-18: 700 [IU]/h via INTRAVENOUS
  Filled 2011-12-18: qty 250

## 2011-12-18 MED ORDER — ONDANSETRON HCL 4 MG/2ML IJ SOLN: 4.0000 mg | Freq: Four times a day (QID) | INTRAMUSCULAR | Status: DC | PRN

## 2011-12-18 MED ORDER — FERROUS SULFATE 325 (65 FE) MG PO TABS
325.0000 mg | ORAL_TABLET | Freq: Every day | ORAL | Status: DC
  Administered 2011-12-18: 325 mg via ORAL
  Filled 2011-12-18 (×2): qty 1

## 2011-12-18 MED ORDER — POTASSIUM CHLORIDE CRYS ER 20 MEQ PO TBCR
40.0000 meq | EXTENDED_RELEASE_TABLET | Freq: Once | ORAL | Status: AC
  Administered 2011-12-18: 40 meq via ORAL

## 2011-12-18 MED ORDER — ATORVASTATIN CALCIUM 10 MG PO TABS: 20.0000 mg | ORAL_TABLET | Freq: Every day | ORAL | Status: DC

## 2011-12-18 MED ORDER — AMIODARONE HCL 200 MG PO TABS
200.0000 mg | ORAL_TABLET | Freq: Every day | ORAL | Status: DC
  Administered 2011-12-18: 200 mg via ORAL
  Filled 2011-12-18: qty 1

## 2011-12-18 MED ORDER — SIMVASTATIN 40 MG PO TABS
40.0000 mg | ORAL_TABLET | Freq: Every day | ORAL | Status: DC
  Administered 2011-12-18: 40 mg via ORAL
  Filled 2011-12-18: qty 1

## 2011-12-18 MED ORDER — ACETAMINOPHEN 325 MG PO TABS: 650.0000 mg | ORAL_TABLET | ORAL | Status: DC | PRN

## 2011-12-18 MED ORDER — PANTOPRAZOLE SODIUM 40 MG PO TBEC
40.0000 mg | DELAYED_RELEASE_TABLET | Freq: Every day | ORAL | Status: DC
  Administered 2011-12-18: 40 mg via ORAL
  Filled 2011-12-18: qty 1

## 2011-12-18 MED ORDER — DILTIAZEM HCL ER BEADS 180 MG PO CP24: 180.0000 mg | ORAL_CAPSULE | Freq: Every day | ORAL | Status: DC

## 2011-12-18 MED ORDER — METOPROLOL SUCCINATE ER 25 MG PO TB24
25.0000 mg | ORAL_TABLET | Freq: Every day | ORAL | Status: DC
  Administered 2011-12-18: 25 mg via ORAL
  Filled 2011-12-18: qty 1

## 2011-12-18 MED ORDER — DILTIAZEM HCL ER COATED BEADS 180 MG PO CP24
180.0000 mg | ORAL_CAPSULE | Freq: Every day | ORAL | Status: DC
  Administered 2011-12-18: 180 mg via ORAL
  Filled 2011-12-18: qty 1

## 2011-12-18 MED ORDER — ASPIRIN EC 81 MG PO TBEC
81.0000 mg | DELAYED_RELEASE_TABLET | Freq: Every day | ORAL | Status: DC
  Administered 2011-12-18: 81 mg via ORAL
  Filled 2011-12-18: qty 1

## 2011-12-18 MED ORDER — FERROUS GLUCONATE IRON 246 (28 FE) MG PO TABS: 27.0000 mg | ORAL_TABLET | Freq: Every day | ORAL | Status: DC

## 2011-12-18 NOTE — ED Provider Notes (Signed)
I saw and evaluated the patient, reviewed the resident's note and I agree with the findings and plan.  Demontez Novack P Lesley Galentine, MD 12/18/11 1516 

## 2011-12-18 NOTE — Progress Notes (Signed)
UR Completed. Simmons, Xsavier Seeley F [phone]  

## 2011-12-18 NOTE — Discharge Summary (Signed)
CARDIOLOGY DISCHARGE SUMMARY   Patient ID: [name] MRN: [id number] DOB/AGE: [birth year] 76 y.o.  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 Paroxysmal atrial fibrillation  . Ischemic heart disease  . Failed CABG (coronary artery bypass graft)  . Hypercholesterolemia  . Anxiety  . Depression  . Anemia   ICM - EF 45% with akinesis of the basal inferior and inferoseptal myocardium and  grade 2 diastolic dysfunction by echo February 2012.      Procedures: CXR  Hospital Course: Ms [name] is an 78 year old female with a history of CAD. She had chest pain and came to the hospital where she was admitted for further evaluation and treatment.   She was volume overloaded on admission and was given IV Lasix for diuresis. Her cardiac enzymes were negative for MI. With diuresis, her potassium dropped and this was supplemented. After diuresis, her symptoms improved. On 12/18/2011, she was evaluated by Dr Patty Sermons and felt to be much improved. She is considered stable for discharge, in improved condition, to follow up closely as an outpatient with a phone call in 2 business days and an office visit in 1 week with a BMET.  Labs:   Lab Results  Component Value Date   WBC 9.4 12/18/2011   HGB 11.9* 12/18/2011   HCT 37.3 12/18/2011   MCV 84.0 12/18/2011   PLT 191 12/18/2011    Lab 12/18/11 0500 12/17/11 1740  NA 141 --  K 3.2* --  CL 102 --  CO2 29 --  BUN 17 --  CREATININE 0.83 --  CALCIUM 9.1 --  PROT -- 5.8*  BILITOT -- 0.6  ALKPHOS -- 75  ALT -- 59*  AST -- 42*  GLUCOSE 95 --    Basename 12/18/11 1050 12/18/11 0247  CKTOTAL 65 80  CKMB 3.7 4.5*  CKMBINDEX -- --  TROPONINI <0.30 <0.30   Lipid Panel    Basename 12/17/11 2024  INR 1.01       Radiology: Dg Chest Portable 1 View  12/17/2011  *RADIOLOGY  REPORT*  Clinical Data: Chest pain.  PORTABLE CHEST - 1 VIEW  Comparison: Portable chest 06/02/2011.  Findings: The heart size is stable.  Diffuse interstitial pattern suggests edema.  Bibasilar airspace disease likely reflects like atelectasis.  The patient is status post median sternotomy.  IMPRESSION:  1.  Mild cardiomegaly with a new diffuse interstitial pattern suggesting edema and congestive heart failure. 2.  Bibasilar airspace disease likely reflects atelectasis.  Early infection is not excluded.  Original Report Authenticated By: Jamesetta Orleans. MATTERN, M.D.    EKG: 18-Dec-2011 05:15:33  Normal sinus rhythm, rate 63 Left ventricular hypertrophy with repolarization abnormality Lead motion artifact noted Abnormal ECG Since prior tracing QRS duration has shortened somewhat  FOLLOW UP PLANS AND APPOINTMENTS Discharge Orders    Future Appointments: Provider: Department: Dept Phone: Center:   12/25/2011 9:45 AM Rosalio Macadamia, NP [facility] [phone] None     Allergies  Allergen Reactions  . Lopressor (Metoprolol Tartrate)     alopecia   Medication List  As of 12/18/2011  1:08 PM   TAKE these medications         amiodarone 200 MG tablet   Commonly known as: PACERONE   Take 200 mg by mouth daily.      aspirin EC 81 MG tablet   Take  81 mg by mouth daily.      atorvastatin 20 MG tablet   Commonly known as: LIPITOR   Take 20 mg by mouth daily.      clopidogrel 75 MG tablet   Commonly known as: PLAVIX   Take 75 mg by mouth daily.      diltiazem 180 MG 24 hr capsule   Commonly known as: TIAZAC   Take 180 mg by mouth daily.      ferrous gluconate 246 (28 FE) MG tablet   Commonly known as: FERGON   Take 27 mg by mouth daily with breakfast.     CHANGED furosemide 40 MG tablet   Commonly known as: LASIX   Take 1 tablet (40 mg total) by mouth daily.      isosorbide mononitrate 60 MG 24 hr tablet   Commonly known as: IMDUR   Take 30 mg by mouth at bedtime.       metoprolol succinate 25 MG 24 hr tablet   Commonly known as: TOPROL-XL   Take 25 mg by mouth daily.      nitroGLYCERIN 0.4 MG SL tablet   Commonly known as: NITROSTAT   Place 0.4 mg under the tongue as needed. For chest pain.      pantoprazole 40 MG tablet   Commonly known as: PROTONIX   Take 40 mg by mouth daily.     NEW potassium chloride SA 20 MEQ tablet   Commonly known as: K-DUR,KLOR-CON   Take 1 tablet (20 mEq total) by mouth daily.           Follow-up Information    Follow up with Norma Fredrickson, NP. (See for Dr Patty Sermons on February 22nd at 9:45 am with a BMET.)    Contact information:   [address] [place] [address] [phone]          BRING ALL MEDICATIONS WITH YOU TO FOLLOW UP APPOINTMENTS  Time spent with patient to include physician time: 57 Min Signed: Theodore Demark 12/18/2011, 1:08 PM Co-Sign MD

## 2011-12-18 NOTE — ED Notes (Addendum)
Foley inserted by tech.

## 2011-12-18 NOTE — Progress Notes (Signed)
Patient brought meds from home. Patient informed the nurse that her niece / husband were visiting this morning and would confiscate the meds and take home. Patient's niece's husband called and informed the nurse that he and wife would be visiting in the am. Harmon Pier

## 2011-12-18 NOTE — ED Notes (Signed)
Nurse unable to take report.  He is to call back in 5 mins

## 2011-12-18 NOTE — H&P (Addendum)
[name] is an 78 y.o. female.   Chief Complaint: Chest Pain HPI: [name] is a very pleasant 78 yo woman with PMH of CABG in '89 and repeat in 10/07, last known EF 45% by TTE 2/12, stable angina, HLD, blindness who comes in with 2 days of CP. [name] tells me she has been doing fairly well except that she does not get out of the house much. Yesterday she noted some chest pain, tightness/pressure more associated with exertion that lasted several minutes but ultimately resolved. It recurred today leading to presentation. No radiation to neck/jaw and currently chest pain free but a bit anxious. She also has some SOB that she is unsure how long has been going on but is more bothersome currently along with some anxiety. In the ER she received Aspirin 324 mg, heparin gtt started and IV lasix 40 mg given with significant UOP and improvement in her breathing.    Past Medical History  Diagnosis Date  . Stroke   . Coronary artery disease   . Hypertension   . Hyperlipidemia     Past Surgical History  Procedure Date  . Coronary artery bypass graft 1989    redo 2007   Family history: no known HTN/T2DM History reviewed. No pertinent family history. Social History:  reports that she has quit smoking. She does not have any smokeless tobacco history on file. She reports that she does not drink alcohol. Her drug history not on file.  Allergies:  Allergies  Allergen Reactions  . Lopressor (Metoprolol Tartrate)     alopecia    Medications Prior to Admission  Medication Dose Route Frequency Provider Last Rate Last Dose  . ALPRAZolam Prudy Feeler) tablet 0.5 mg  0.5 mg Oral Once Particia Lather, MD   0.5 mg at 12/17/11 2220  . furosemide (LASIX) injection 40 mg  40 mg Intravenous Once Particia Lather, MD   40 mg at 12/17/11 2220  . heparin ADULT infusion 100 units/mL (25000 units/250 mL)  12 Units/kg/hr Intravenous Once Particia Lather, MD 5 mL/hr at 12/17/11 2100 12 Units/kg/hr at 12/17/11 2100  . heparin bolus  via infusion 1,500 Units  1,500 Units Intravenous Once Particia Lather, MD   1,500 Units at 12/17/11 2101  . nitroGLYCERIN (NITROSTAT) SL tablet 0.4 mg  0.4 mg Sublingual Q5 min PRN Particia Lather, MD       Medications Prior to Admission  Medication Sig Dispense Refill  . amiodarone (PACERONE) 200 MG tablet Take 200 mg by mouth daily.      Marland Kitchen diltiazem (TIAZAC) 180 MG 24 hr capsule Take 180 mg by mouth daily.      . ferrous gluconate (FERGON) 246 (28 FE) MG tablet Take 27 mg by mouth daily with breakfast.        . furosemide (LASIX) 20 MG tablet Take 20 mg by mouth every Monday, Wednesday, and Friday.       . isosorbide mononitrate (IMDUR) 60 MG 24 hr tablet Take 30 mg by mouth at bedtime.      . nitroGLYCERIN (NITROSTAT) 0.4 MG SL tablet Place 0.4 mg under the tongue as needed. For chest pain.        Review of Systems  Constitutional: Negative for fever, chills and weight loss.  HENT: Negative for ear pain, neck pain and tinnitus.   Eyes: Negative for blurred vision, double vision and photophobia.  Respiratory: Positive for shortness of breath. Negative for cough, hemoptysis and sputum production.   Cardiovascular: Positive for chest pain.  Negative for palpitations, orthopnea, claudication and leg swelling.  Gastrointestinal: Negative for heartburn, nausea, vomiting and abdominal pain.  Genitourinary: Positive for frequency. Negative for dysuria and urgency.  Musculoskeletal: Negative for myalgias and back pain.  Skin: Negative for itching and rash.  Neurological: Negative for dizziness, tingling, tremors and headaches.  Endo/Heme/Allergies: Negative for environmental allergies and polydipsia.  Psychiatric/Behavioral: Negative for depression, suicidal ideas and substance abuse.    Blood pressure 126/93, pulse 63, temperature 98.2 F (36.8 C), temperature source Oral, resp. rate 21, height 5\' 3"  (1.6 m), weight 43.092 kg (95 lb), SpO2 94.00%. Physical Exam  Nursing note and vitals  reviewed. Constitutional: She is oriented to person, place, and time.       Thin woman in NAD  HENT:  Head: Normocephalic and atraumatic.  Nose: Nose normal.  Mouth/Throat: Oropharynx is clear and moist. No oropharyngeal exudate.  Eyes: Conjunctivae and EOM are normal. Pupils are equal, round, and reactive to light. No scleral icterus.  Neck: Normal range of motion. Neck supple. JVD present. No thyromegaly present.       JVD to earlobes  Cardiovascular: Normal rate, regular rhythm, normal heart sounds and intact distal pulses.  Exam reveals no gallop.   No murmur heard. Respiratory: Effort normal. No respiratory distress. She has rales.       Bilateral basilar rales; normal bilateral symmetrical excursion  GI: Soft. Bowel sounds are normal. She exhibits no distension. There is no tenderness.  Musculoskeletal: Normal range of motion. She exhibits no edema and no tenderness.  Neurological: She is alert and oriented to person, place, and time. She has normal reflexes. No cranial nerve deficit. Coordination normal.  Skin: Skin is warm and dry. No rash noted. No erythema.  Psychiatric: She has a normal mood and affect. Her behavior is normal. Thought content normal.    Labs reviewed in EPIC: na 138, K 4.3, creatinine 0.87, plt 211, inr 1, ptt 32 Chest x-ray: cardiomegaly, bilateral edema/subtle effusions EKG: reviewed; NSR with nonspecific t wave changes/? Lateral ischemia  Assessment/Plan Problem List Chest Pain Volume overload/elevated JVD/SOB/DOE CAD with prior CABG LV Dysfunction Atrial fibrillation Hypertension Dyslipidemia  78 yo woman with CAD s/p CABG, prior atrial fibrillation, LV dysfunction, HTN, dyslipidemia here with chest pain and chest x-ray with volume overload corresponding to elevated JVP.  Chest Pain: may be related to volume overload vs. Unstable angina with multiple prior revascularizations. No hypoxia suggestive of PE.  - on aspirin, heparin gtt, trend troponins,  on telemetry - treatment for volume overload - initial troponins negative, if continue to trend negative, d/c heparin tomorrow LV dysfunction/Volume overload: likely related to combined systolic/diastolic acute on chronic heart failure with elevated JVP, SOB/rales.  - initially very good diuresis with 40 mg IV lasix - bmp in am, reassess volume status and need for further IV diuresis then, replete potassium as needed Stable angina/CAD: continue treatment above, on aspirin, bb, statin, plavix, isordil Atrial fibrillation: paroxysmal - continue amiodarone 200 mg, on diltiazem and toprol XL  GERD: continue PPI  Kamal Jurgens 12/18/2011, 12:07 AM

## 2011-12-18 NOTE — Progress Notes (Signed)
ANTICOAGULATION CONSULT NOTE - Follow Up Consult  Pharmacy Consult for heparin Indication: chest pain/ACS  Labs:  Basename 12/18/11 0500 12/18/11 0247 12/17/11 2024 12/17/11 1740  HGB 11.9* -- -- 11.5*  HCT 37.3 -- -- 36.0  PLT 191 -- -- 211  APTT -- -- 32 --  LABPROT -- -- 13.5 --  INR -- -- 1.01 --  HEPARINUNFRC <0.10* -- -- --  CREATININE -- -- -- 0.87  CKTOTAL -- 80 -- --  CKMB -- 4.5* -- --  TROPONINI -- <0.30 -- --   Assessment: 78yo female undetectable on heparin with initial dosing for CP.  Goal of Therapy:  Heparin level 0.3-0.7 units/ml   Plan:  Will give heparin bolus of 1000 units and increase gtt by 4 units/kg/hr to 700 units/hr and check level in 8hr.  Colleen Can PharmD BCPS 12/18/2011,6:39 AM

## 2011-12-18 NOTE — Progress Notes (Signed)
Subjective:  Denies chest pain or dyspnea.  Troponins are negative. Excellent diuresis after IV lasix with subsequent hypokalemia.  Objective:  Vital Signs in the last 24 hours: Temp:  [98.1 F (36.7 C)-98.6 F (37 C)] 98.1 F (36.7 C) (02/15 0416) Pulse Rate:  [58-82] 66  (02/15 0416) Resp:  [12-30] 20  (02/15 0416) BP: (112-164)/(41-102) 125/60 mmHg (02/15 0957) SpO2:  [90 %-98 %] 98 % (02/15 0416) Weight:  [93 lb (42.185 kg)-97 lb (43.999 kg)] 97 lb (43.999 kg) (02/15 0416)  Intake/Output from previous day: 02/14 0701 - 02/15 0700 In: 25 [I.V.:25] Out: 1400 [Urine:1400] Intake/Output from this shift: Total I/O In: 120 [P.O.:120] Out: 700 [Urine:700]     . ALPRAZolam  0.5 mg Oral Once  . amiodarone  200 mg Oral Daily  . aspirin EC  81 mg Oral Daily  . clopidogrel  75 mg Oral Daily  . diltiazem  180 mg Oral Daily  . ferrous sulfate  325 mg Oral Q breakfast  . furosemide  40 mg Intravenous Once  . heparin  12 Units/kg/hr Intravenous Once  . heparin  1,000 Units Intravenous Once  . heparin  1,500 Units Intravenous Once  . isosorbide mononitrate  30 mg Oral QHS  . metoprolol succinate  25 mg Oral Daily  . pantoprazole  40 mg Oral Daily  . simvastatin  40 mg Oral Daily  . DISCONTD: diltiazem  180 mg Oral Daily  . DISCONTD: ferrous gluconate  123 mg Oral Q breakfast      . heparin 700 Units/hr (12/18/11 4098)    Physical Exam: The patient appears to be in no distress.  Head and neck exam reveals that the pupils are equal and reactive.  The extraocular movements are full.  There is no scleral icterus.  Mouth and pharynx are benign.  No lymphadenopathy.  No carotid bruits.  The jugular venous pressure is normal.  Thyroid is not enlarged or tender.  Chest is clear to percussion and auscultation.  No rales or rhonchi.  Expansion of the chest is symmetrical.  Heart reveals no abnormal lift or heave.  First and second heart sounds are normal.  There is no murmur  gallop rub or click.  The abdomen is soft and nontender.  Bowel sounds are normoactive.  There is no hepatosplenomegaly or mass.  There are no abdominal bruits.  Extremities reveal no phlebitis or edema.  Pedal pulses are good.  There is no cyanosis or clubbing.  Neurologic exam is normal strength and no lateralizing weakness.  No sensory deficits.  Integument reveals no rash  Lab Results:  Basename 12/18/11 0500 12/17/11 1740  WBC 9.4 7.1  HGB 11.9* 11.5*  PLT 191 211    Basename 12/18/11 0500 12/17/11 1740  NA 141 138  K 3.2* 4.3  CL 102 104  CO2 29 27  GLUCOSE 95 100*  BUN 17 22  CREATININE 0.83 0.87    Basename 12/18/11 0247  TROPONINI <0.30   Hepatic Function Panel  Basename 12/17/11 1740  PROT 5.8*  ALBUMIN 3.0*  AST 42*  ALT 59*  ALKPHOS 75  BILITOT 0.6  BILIDIR --  IBILI --   No results found for this basename: CHOL in the last 72 hours No results found for this basename: PROTIME in the last 72 hours  Imaging: Imaging results have been reviewed  Cardiac Studies: EKG shows no acute changes.  Telemetry stable NSR. Assessment/Plan:  Patient Active Hospital Problem List: Acute on chronic combined systolic/diastolic CHF,  resolved. Ischemic heart disease S/P CABG    Troponins negative.  Patient is anxious to go home today.  Will discharge today on higher dose of Lasix and give KCL supplementation. See me or Lawson Fiscal within 1 week and get followup BMET then.  LOS: 1 day    Cassell Clement 12/18/2011, 10:48 AM

## 2011-12-21 ENCOUNTER — Telehealth: Payer: Self-pay | Admitting: Pharmacist

## 2011-12-21 NOTE — Telephone Encounter (Signed)
Attempted to call patient and emergency contact.  Neither number is a working number.

## 2011-12-22 NOTE — Telephone Encounter (Signed)
Attempted to reach pt again.  Her home number no longer works.  Emergency contact number busy on multiple occassions.

## 2011-12-24 NOTE — Telephone Encounter (Signed)
Unable to reach pt to confirm appt  

## 2011-12-25 ENCOUNTER — Encounter: Payer: Self-pay | Admitting: Nurse Practitioner

## 2011-12-25 ENCOUNTER — Ambulatory Visit (INDEPENDENT_AMBULATORY_CARE_PROVIDER_SITE_OTHER): Payer: Medicare Other | Admitting: Nurse Practitioner

## 2011-12-25 VITALS — BP 122/62 | HR 62 | Ht 63.0 in | Wt 100.0 lb

## 2011-12-25 DIAGNOSIS — I5042 Chronic combined systolic (congestive) and diastolic (congestive) heart failure: Secondary | ICD-10-CM

## 2011-12-25 DIAGNOSIS — E876 Hypokalemia: Secondary | ICD-10-CM

## 2011-12-25 DIAGNOSIS — I5043 Acute on chronic combined systolic (congestive) and diastolic (congestive) heart failure: Secondary | ICD-10-CM

## 2011-12-25 DIAGNOSIS — I259 Chronic ischemic heart disease, unspecified: Secondary | ICD-10-CM

## 2011-12-25 LAB — BASIC METABOLIC PANEL
BUN: 30 mg/dL — ABNORMAL HIGH (ref 6–23)
CO2: 33 mEq/L — ABNORMAL HIGH (ref 19–32)
Calcium: 9.1 mg/dL (ref 8.4–10.5)
Chloride: 97 mEq/L (ref 96–112)
Creatinine, Ser: 1.1 mg/dL (ref 0.4–1.2)
GFR: 49.77 mL/min — ABNORMAL LOW (ref >60.00)
Glucose, Bld: 93 mg/dL (ref 70–99)
Potassium: 4.1 mEq/L (ref 3.5–5.1)
Sodium: 137 mEq/L (ref 135–145)

## 2011-12-25 NOTE — Assessment & Plan Note (Signed)
Looks like she is doing better clinically. We will check a BMET today. I have left her on her current medicines. She did not get an echo during that admission so we will go ahead and update. Will tentatively see her back in a month. Have asked her to try and get some new scales that she can see. Patient is agreeable to this plan and will call if any problems develop in the interim.
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
## 2011-12-25 NOTE — Assessment & Plan Note (Signed)
Has had prior CABG as well as redo CABG. Has stable symptoms. Not interested in further procedures. Will continue with her current regimen.

## 2011-12-25 NOTE — Assessment & Plan Note (Signed)
Rechecking her labs today.

## 2011-12-25 NOTE — Patient Instructions (Addendum)
Try to get some new scales and weigh daily.  Stay on your same medicines  We are going to arrange for a repeat ultrasound of your heart.  We are going to check some labs today.   We will see you in 4 weeks.  Call the [facility] office at [phone] if you have any questions, problems or concerns.

## 2011-12-31 ENCOUNTER — Telehealth: Payer: Self-pay | Admitting: *Deleted

## 2011-12-31 DIAGNOSIS — Z79899 Other long term (current) drug therapy: Secondary | ICD-10-CM

## 2011-12-31 NOTE — Telephone Encounter (Signed)
Message copied by Burnell Blanks on Thu Dec 31, 2011 12:38 PM ------      Message from: Rosalio Macadamia      Created: Fri Dec 25, 2011  1:44 PM       Ok to report. BUN is up a little. Please recheck when she comes for her echo.

## 2011-12-31 NOTE — Telephone Encounter (Signed)
Unable to reach via phone (#disconnected) scheduled recheck of labs day of echo

## 2012-01-04 ENCOUNTER — Ambulatory Visit (HOSPITAL_COMMUNITY): Payer: Medicare Other

## 2012-01-04 ENCOUNTER — Telehealth: Payer: Self-pay | Admitting: Cardiology

## 2012-01-04 ENCOUNTER — Other Ambulatory Visit: Payer: Medicare Other

## 2012-01-04 NOTE — Telephone Encounter (Signed)
New Msg: Pt niece calling wanting to know if pt can get ECHO done at [facility] and just have results faxed to our office. Please return call to discuss further.

## 2012-01-05 NOTE — Telephone Encounter (Signed)
Rescheduled at the [facility] office.

## 2012-01-19 ENCOUNTER — Other Ambulatory Visit (HOSPITAL_COMMUNITY): Payer: Medicare Other

## 2012-01-19 ENCOUNTER — Other Ambulatory Visit: Payer: Medicare Other

## 2012-01-20 ENCOUNTER — Other Ambulatory Visit: Payer: Self-pay

## 2012-01-20 ENCOUNTER — Other Ambulatory Visit (INDEPENDENT_AMBULATORY_CARE_PROVIDER_SITE_OTHER): Payer: Medicare Other

## 2012-01-20 DIAGNOSIS — I5043 Acute on chronic combined systolic (congestive) and diastolic (congestive) heart failure: Secondary | ICD-10-CM

## 2012-01-20 DIAGNOSIS — I259 Chronic ischemic heart disease, unspecified: Secondary | ICD-10-CM

## 2012-01-20 DIAGNOSIS — I5042 Chronic combined systolic (congestive) and diastolic (congestive) heart failure: Secondary | ICD-10-CM

## 2012-01-20 DIAGNOSIS — I428 Other cardiomyopathies: Secondary | ICD-10-CM

## 2012-01-26 ENCOUNTER — Encounter: Payer: Self-pay | Admitting: Cardiology

## 2012-01-26 ENCOUNTER — Ambulatory Visit (INDEPENDENT_AMBULATORY_CARE_PROVIDER_SITE_OTHER): Payer: Medicare Other | Admitting: Cardiology

## 2012-01-26 VITALS — BP 120/60 | HR 64 | Wt 105.0 lb

## 2012-01-26 DIAGNOSIS — E7800 Pure hypercholesterolemia, unspecified: Secondary | ICD-10-CM

## 2012-01-26 DIAGNOSIS — I480 Paroxysmal atrial fibrillation: Secondary | ICD-10-CM

## 2012-01-26 DIAGNOSIS — I4891 Unspecified atrial fibrillation: Secondary | ICD-10-CM

## 2012-01-26 DIAGNOSIS — E876 Hypokalemia: Secondary | ICD-10-CM

## 2012-01-26 DIAGNOSIS — I259 Chronic ischemic heart disease, unspecified: Secondary | ICD-10-CM

## 2012-01-26 DIAGNOSIS — I2510 Atherosclerotic heart disease of native coronary artery without angina pectoris: Secondary | ICD-10-CM

## 2012-01-26 LAB — BASIC METABOLIC PANEL
GFR: 49.76 mL/min — ABNORMAL LOW (ref >60.00)
Glucose, Bld: 95 mg/dL (ref 70–99)
Potassium: 3.7 mEq/L (ref 3.5–5.1)
Sodium: 140 mEq/L (ref 135–145)

## 2012-01-26 NOTE — Patient Instructions (Signed)
Will obtain labs today and call you with the results (bmet)  Your physician recommends that you continue on your current medications as directed. Please refer to the Current Medication list given to you today.  Your physician recommends that you schedule a follow-up appointment in: 3 months with fasting labs (LP/BMET/HFP)  

## 2012-01-26 NOTE — Assessment & Plan Note (Signed)
The patient has a history of hypercholesterolemia and is on Lipitor 20 mg daily.  She denies any myalgias.

## 2012-01-26 NOTE — Assessment & Plan Note (Signed)
She rarely has to use any sublingual nitroglycerin for chest pain.

## 2012-01-26 NOTE — Assessment & Plan Note (Signed)
The patient has not reported any further episodes of racing or skipping of her heart.  Her EKG today shows sinus bradycardia and she is tolerating amiodarone 200 mg daily without difficulty

## 2012-01-26 NOTE — Progress Notes (Signed)
[name] Date of Birth:  08-31-1926 [facility] [address] [place], [place]  [address] [phone]         Fax   [phone]  History of Present Illness: This pleasant 78 year old woman is seen for a scheduled followup office visit.  She has a history of known ischemic heart disease.  She had coronary artery bypass graft surgery in 1989.  She had redo coronary artery bypass graft surgery in October 2000 and 7Z past history of paroxysmal atrial fibrillation.  She was admitted in February 2012 in atrial fibrillation but converted during that admission to sinus rhythm.  Her echocardiogram on 12/27/10 showed an ejection fraction of 45% with akinesis of the basal inferior and inferoseptal myocardial.  She has grade 2 diastolic dysfunction.  The patient has a history of hypercholesterolemia, history of anxiety, history of insomnia, and she is legally blind.  Her most recent echocardiogram on 01/20/12 showed an ejection fraction of 45-50%.  Current Outpatient Prescriptions  Medication Sig Dispense Refill  . amiodarone (PACERONE) 200 MG tablet Take 200 mg by mouth daily.      Marland Kitchen aspirin EC 81 MG tablet Take 81 mg by mouth daily.      Marland Kitchen atorvastatin (LIPITOR) 20 MG tablet Take 20 mg by mouth daily.      . clopidogrel (PLAVIX) 75 MG tablet Take 75 mg by mouth daily.      . ferrous gluconate (FERGON) 246 (28 FE) MG tablet Take 27 mg by mouth daily with breakfast.        . furosemide (LASIX) 40 MG tablet Take 1 tablet (40 mg total) by mouth daily.  30 tablet  11  . isosorbide mononitrate (IMDUR) 60 MG 24 hr tablet Take 30 mg by mouth at bedtime.      . metoprolol succinate (TOPROL-XL) 25 MG 24 hr tablet Take 25 mg by mouth daily.      . nitroGLYCERIN (NITROSTAT) 0.4 MG SL tablet Place 0.4 mg under the tongue as needed. For chest pain.      . pantoprazole (PROTONIX) 40 MG tablet Take 40 mg by mouth daily.      . potassium chloride SA (K-DUR,KLOR-CON) 20 MEQ tablet Take 1  tablet (20 mEq total) by mouth daily.  30 tablet  11  . diltiazem (TIAZAC) 180 MG 24 hr capsule Take 180 mg by mouth daily.      Marland Kitchen DISCONTD: amiodarone (PACERONE) 200 MG tablet Take 1 tablet (200 mg total) by mouth 1 dose over 46 hours.  30 tablet  5    Allergies  Allergen Reactions  . Lopressor (Metoprolol Tartrate)     alopecia    Patient Active Problem List  Diagnoses  . Paroxysmal atrial fibrillation  . Ischemic heart disease  . Failed CABG (coronary artery bypass graft)  . Hypercholesterolemia  . Anxiety  . Depression  . Anemia  . Cardiomyopathy, ischemic  . Acute on chronic combined systolic and diastolic congestive heart failure  . Acute hypokalemia    History  Smoking status  . Former Smoker  Smokeless tobacco  . Not on file    History  Alcohol Use No    No family history on file.  Review of Systems: Constitutional: no fever chills diaphoresis or fatigue or change in weight.  Head and neck: no hearing loss, no epistaxis, no photophobia or visual disturbance. Respiratory: No cough, shortness of breath or wheezing. Cardiovascular: No chest pain peripheral edema, palpitations. Gastrointestinal: No abdominal distention, no abdominal  pain, no change in bowel habits hematochezia or melena. Genitourinary: No dysuria, no frequency, no urgency, no nocturia. Musculoskeletal:No arthralgias, no back pain, no gait disturbance or myalgias. Neurological: No dizziness, no headaches, no numbness, no seizures, no syncope, no weakness, no tremors. Hematologic: No lymphadenopathy, no easy bruising. Psychiatric: No confusion, no hallucinations, no sleep disturbance.    Physical Exam: Filed Vitals:   01/26/12 0900  BP: 120/60  Pulse: 64   the general appearance reveals an elderly woman in no acute distress.  She is legally blind.Pupils equal and reactive.   Extraocular Movements are full.  There is no scleral icterus.  The mouth and pharynx are normal.  The neck is supple.   The carotids reveal no bruits.  The jugular venous pressure is normal.  The thyroid is not enlarged.  There is no lymphadenopathy.  The chest is clear to percussion and auscultation. There are no rales or rhonchi. Expansion of the chest is symmetrical.  Heart reveals grade 2/6 systolic ejection murmur at the base.  No gallop or rub.The abdomen is soft and nontender. Bowel sounds are normal. The liver and spleen are not enlarged. There Are no abdominal masses. There are no bruits.  The pedal pulses are good.  There is no phlebitis or edema.  There is no cyanosis or clubbing. Strength is normal and symmetrical in all extremities.  There is no lateralizing weakness.  There are no sensory deficits.  The skin is warm and dry.  There is no rash.  EKG today shows sinus bradycardia with pattern of LVH with secondary ST-T wave changes.  She also has a past history of an old anterolateral myocardial infarction by EKG   Assessment / Plan:  Continue same medication.  Recheck in 3 months for followup office visit hepatic function panel nasal metabolic panel and fasting lipid panel

## 2012-01-26 NOTE — Assessment & Plan Note (Signed)
The patient has a past history of low potassium.  Her last potassium was normal with potassium supplementation.  We are rechecking a basal metabolic panel today to assure stability.

## 2012-01-26 NOTE — Progress Notes (Signed)
Quick Note:  Please report to patient. The recent labs are stable. Continue same medication and careful diet. ______ 

## 2012-01-28 ENCOUNTER — Telehealth: Payer: Self-pay | Admitting: *Deleted

## 2012-01-28 NOTE — Telephone Encounter (Signed)
Message copied by Burnell Blanks on Thu Jan 28, 2012  5:18 PM ------      Message from: Cassell Clement      Created: Tue Jan 26, 2012  8:58 PM       Please report to patient.  The recent labs are stable. Continue same medication and careful diet.

## 2012-01-28 NOTE — Telephone Encounter (Signed)
Advised patient of lab results  

## 2012-02-04 ENCOUNTER — Other Ambulatory Visit: Payer: Self-pay | Admitting: *Deleted

## 2012-02-04 DIAGNOSIS — F419 Anxiety disorder, unspecified: Secondary | ICD-10-CM

## 2012-02-04 MED ORDER — ALPRAZOLAM 0.25 MG PO TABS: 0.2500 mg | ORAL_TABLET | Freq: Two times a day (BID) | ORAL | Status: DC

## 2012-02-04 NOTE — Telephone Encounter (Signed)
Refill on alprazolam 

## 2012-02-14 ENCOUNTER — Other Ambulatory Visit: Payer: Self-pay | Admitting: Cardiology

## 2012-02-15 NOTE — Telephone Encounter (Signed)
Refilled pantoprazole.

## 2012-02-23 ENCOUNTER — Other Ambulatory Visit: Payer: Self-pay | Admitting: Cardiology

## 2012-02-23 DIAGNOSIS — G4700 Insomnia, unspecified: Secondary | ICD-10-CM

## 2012-02-23 NOTE — Telephone Encounter (Signed)
Okay to restart temazepam 15 mg HS prn #30

## 2012-02-23 NOTE — Telephone Encounter (Signed)
Cannot find in the chart where the patient is currently taking temazepam.  I do see where is was d/c'd under the medications tab.  Told pt that we will have to talk with Dr. Patty Sermons prior to refilling this med.  Pt agreed.

## 2012-02-23 NOTE — Telephone Encounter (Signed)
New msg Pt wants refill of temazepam 15 mg please call to [facility] in [place].

## 2012-02-24 MED ORDER — TEMAZEPAM 15 MG PO CAPS: 15.0000 mg | ORAL_CAPSULE | Freq: Every evening | ORAL | Status: DC | PRN

## 2012-02-24 NOTE — Telephone Encounter (Signed)
Called to pharmacy 

## 2012-02-26 ENCOUNTER — Other Ambulatory Visit: Payer: Self-pay | Admitting: Cardiology

## 2012-04-28 ENCOUNTER — Ambulatory Visit: Payer: Medicare Other | Admitting: Cardiology

## 2012-04-29 ENCOUNTER — Encounter: Payer: Self-pay | Admitting: Cardiology

## 2012-04-29 ENCOUNTER — Ambulatory Visit (INDEPENDENT_AMBULATORY_CARE_PROVIDER_SITE_OTHER): Payer: Medicare Other | Admitting: Cardiology

## 2012-04-29 VITALS — BP 100/60 | HR 70 | Ht 63.0 in | Wt 106.0 lb

## 2012-04-29 DIAGNOSIS — I2510 Atherosclerotic heart disease of native coronary artery without angina pectoris: Secondary | ICD-10-CM

## 2012-04-29 DIAGNOSIS — I4891 Unspecified atrial fibrillation: Secondary | ICD-10-CM

## 2012-04-29 DIAGNOSIS — E876 Hypokalemia: Secondary | ICD-10-CM

## 2012-04-29 DIAGNOSIS — E7800 Pure hypercholesterolemia, unspecified: Secondary | ICD-10-CM

## 2012-04-29 DIAGNOSIS — I259 Chronic ischemic heart disease, unspecified: Secondary | ICD-10-CM

## 2012-04-29 DIAGNOSIS — I480 Paroxysmal atrial fibrillation: Secondary | ICD-10-CM

## 2012-04-29 LAB — BASIC METABOLIC PANEL
BUN: 28 mg/dL — ABNORMAL HIGH (ref 6–23)
Calcium: 9.3 mg/dL (ref 8.4–10.5)
Chloride: 101 mEq/L (ref 96–112)
Creatinine, Ser: 1.2 mg/dL (ref 0.4–1.2)
GFR: 47.26 mL/min — ABNORMAL LOW (ref >60.00)

## 2012-04-29 LAB — LIPID PANEL
Cholesterol: 158 mg/dL (ref 0–200)
HDL: 74.5 mg/dL (ref >39.00)
LDL Cholesterol: 65 mg/dL (ref 0–99)
Total CHOL/HDL Ratio: 2
Triglycerides: 94 mg/dL (ref 0.0–149.0)
VLDL: 18.8 mg/dL (ref 0.0–40.0)

## 2012-04-29 LAB — HEPATIC FUNCTION PANEL
ALT: 27 U/L (ref 0–35)
Bilirubin, Direct: 0.2 mg/dL (ref 0.0–0.3)
Total Bilirubin: 1.2 mg/dL (ref 0.3–1.2)

## 2012-04-29 NOTE — Progress Notes (Signed)
[name] Date of Birth:  July 01, 1926 [facility] [address] [place], [place]  [address] [phone]         Fax   [phone]  History of Present Illness: This pleasant 78 year old woman is seen for a scheduled 3 month followup office visit.  She has a history of known ischemic heart disease.  She had coronary artery bypass graft surgery in 1989.  He had redo CABG in October 2000.  He is a past history of paroxysmal atrial fibrillation.  He is not on Coumadin because of a fall risk.  She is on aspirin and Plavix because of her ischemic heart disease.  She has a history of hypercholesterolemia, anxiety, insomnia, and she is legally blind.  Her most recent echocardiogram 01/20/12 showed an ejection fraction of 45-50%.  She has grade 2 diastolic dysfunction.  She is maintaining normal sinus rhythm on amiodarone 200 mg daily  Current Outpatient Prescriptions  Medication Sig Dispense Refill  . amiodarone (PACERONE) 200 MG tablet Take 200 mg by mouth daily.      Marland Kitchen aspirin EC 81 MG tablet Take 81 mg by mouth daily.      Marland Kitchen atorvastatin (LIPITOR) 20 MG tablet Take 20 mg by mouth daily.      . clopidogrel (PLAVIX) 75 MG tablet Take 75 mg by mouth daily.      Marland Kitchen diltiazem (DILACOR XR) 180 MG 24 hr capsule Take 180 mg by mouth daily.      . ferrous gluconate (FERGON) 246 (28 FE) MG tablet Take 27 mg by mouth daily with breakfast.        . furosemide (LASIX) 40 MG tablet Take 1 tablet (40 mg total) by mouth daily.  30 tablet  11  . isosorbide mononitrate (IMDUR) 60 MG 24 hr tablet Take 30 mg by mouth at bedtime.      Marland Kitchen NITROSTAT 0.4 MG SL tablet TAKE 1 TABLET SUBLINGUALLY AS NEEDED FOR CHEST PAIN  25 tablet  4  . pantoprazole (PROTONIX) 40 MG tablet Take 40 mg by mouth daily.      . pantoprazole (PROTONIX) 40 MG tablet TAKE 1 TABLET BY MOUTH EVERY DAY  90 tablet  3  . potassium chloride SA (K-DUR,KLOR-CON) 20 MEQ tablet Take 1 tablet (20 mEq total) by mouth daily.  30  tablet  11  . diltiazem (TIAZAC) 180 MG 24 hr capsule Take 180 mg by mouth daily.      . metoprolol succinate (TOPROL-XL) 25 MG 24 hr tablet Take 25 mg by mouth 2 (two) times daily.       . temazepam (RESTORIL) 15 MG capsule Take 1 capsule (15 mg total) by mouth at bedtime as needed for sleep.  30 capsule  2  . temazepam (RESTORIL) 15 MG capsule Take 1 capsule (15 mg total) by mouth at bedtime as needed for sleep.  30 capsule  3    Allergies  Allergen Reactions  . Lopressor (Metoprolol Tartrate)     alopecia    Patient Active Problem List  Diagnosis  . Paroxysmal atrial fibrillation  . Ischemic heart disease  . Failed CABG (coronary artery bypass graft)  . Hypercholesterolemia  . Anxiety  . Depression  . Anemia  . Cardiomyopathy, ischemic  . Acute on chronic combined systolic and diastolic congestive heart failure  . Acute hypokalemia    History  Smoking status  . Former Smoker  Smokeless tobacco  . Not on file    History  Alcohol Use  No    No family history on file.  Review of Systems: Constitutional: no fever chills diaphoresis or fatigue or change in weight.  Head and neck: no hearing loss, no epistaxis, no photophobia or visual disturbance. Respiratory: No cough, shortness of breath or wheezing. Cardiovascular: No chest pain peripheral edema, palpitations. Gastrointestinal: No abdominal distention, no abdominal pain, no change in bowel habits hematochezia or melena. Genitourinary: No dysuria, no frequency, no urgency, no nocturia. Musculoskeletal:No arthralgias, no back pain, no gait disturbance or myalgias. Neurological: No dizziness, no headaches, no numbness, no seizures, no syncope, no weakness, no tremors. Hematologic: No lymphadenopathy, no easy bruising. Psychiatric: No confusion, no hallucinations, no sleep disturbance.    Physical Exam: Filed Vitals:   04/29/12 1123  BP: 100/60  Pulse: 70   the general appearance reveals a woman in no distress.   She has gained 1 pound since last visit.  She is legally blind.The head and neck exam reveals pupils equal and reactive.  Extraocular movements are full.  There is no scleral icterus.  The mouth and pharynx are normal.  The neck is supple.  The carotids reveal no bruits.  The jugular venous pressure is normal.  The  thyroid is not enlarged.  There is no lymphadenopathy.  The chest is clear to percussion and auscultation.  There are no rales or rhonchi.  Expansion of the chest is symmetrical.  The precordium is quiet.  The first heart sound is normal.  The second heart sound is physiologically split.  There is no gallop rub or click.  There is a grade 2/6 systolic ejection murmur at the base There is no abnormal lift or heave.  The abdomen is soft and nontender.  The bowel sounds are normal.  The liver and spleen are not enlarged.  There are no abdominal masses.  There are no abdominal bruits.  Extremities reveal good pedal pulses.  There is no phlebitis or edema.  There is no cyanosis or clubbing.  Strength is normal and symmetrical in all extremities.  There is no lateralizing weakness.  There are no sensory deficits.  The skin is warm and dry.  There is no rash.     Assessment / Plan: Continue same medication.  Blood work today pending.  We confirmed that she is taking her Toprol XL 25 mg twice a day.  She will continue this dose.  Does complain of weakness and we are checking a CBC as well as fasting lab work today.  She'll be rechecked in 3 months for followup office visit and EKG

## 2012-04-29 NOTE — Assessment & Plan Note (Signed)
The patient has a history of hypercholesterolemia and is on Lipitor 20 mg daily.  We're checking fasting lab work today

## 2012-04-29 NOTE — Assessment & Plan Note (Signed)
She has not had any prolonged chest pain to suggest angina pectoris.  She has had some brief chest discomfort which she has attributed to indigestion.  If she has any doubt she will normally take a sublingual nitroglycerin

## 2012-04-29 NOTE — Patient Instructions (Addendum)
Will obtain labs today and call you with the results  Your physician recommends that you continue on your current medications as directed. Please refer to the Current Medication list given to you today.  Your physician wants you to follow-up in: 3 months You will receive a reminder letter in the mail two months in advance. If you don't receive a letter, please call our office to schedule the follow-up appointment.

## 2012-04-29 NOTE — Assessment & Plan Note (Signed)
She has not had any recurrence of her atrial fibrillation 

## 2012-05-01 NOTE — Progress Notes (Signed)
Quick Note:  Please report to patient. The recent labs are stable. Continue same medication and careful diet. ______ 

## 2012-05-02 ENCOUNTER — Telehealth: Payer: Self-pay | Admitting: *Deleted

## 2012-05-02 NOTE — Telephone Encounter (Signed)
Advised patient of lab results  

## 2012-05-02 NOTE — Telephone Encounter (Signed)
Message copied by Burnell Blanks on Mon May 02, 2012 12:24 PM ------      Message from: Cassell Clement      Created: Sun May 01, 2012  7:37 PM       Please report to patient.  The recent labs are stable. Continue same medication and careful diet.

## 2012-05-14 ENCOUNTER — Other Ambulatory Visit: Payer: Self-pay | Admitting: Cardiology

## 2012-05-16 NOTE — Telephone Encounter (Signed)
Fax Received. Refill Completed. Fate Galanti Chowoe (R.M.A)   

## 2012-05-23 ENCOUNTER — Other Ambulatory Visit: Payer: Self-pay | Admitting: *Deleted

## 2012-05-23 ENCOUNTER — Other Ambulatory Visit: Payer: Self-pay | Admitting: Cardiology

## 2012-05-23 DIAGNOSIS — F419 Anxiety disorder, unspecified: Secondary | ICD-10-CM

## 2012-05-23 NOTE — Telephone Encounter (Signed)
Please return call to sharee wilson - pt [phone], regarding patient medication

## 2012-05-24 MED ORDER — ALPRAZOLAM 0.25 MG PO TABS: 0.2500 mg | ORAL_TABLET | Freq: Two times a day (BID) | ORAL | Status: AC

## 2012-05-28 ENCOUNTER — Emergency Department (HOSPITAL_COMMUNITY): Payer: Medicare Other

## 2012-05-28 ENCOUNTER — Encounter (HOSPITAL_COMMUNITY): Payer: Self-pay

## 2012-05-28 ENCOUNTER — Inpatient Hospital Stay (HOSPITAL_COMMUNITY)
Admission: EM | Admit: 2012-05-28 | Discharge: 2012-05-29 | DRG: 292 | Disposition: A | Discharge status: Home / Self Care | Payer: Medicare Other | Attending: Internal Medicine | Admitting: Internal Medicine

## 2012-05-28 DIAGNOSIS — I509 Heart failure, unspecified: Secondary | ICD-10-CM | POA: Diagnosis present

## 2012-05-28 DIAGNOSIS — N179 Acute kidney failure, unspecified: Secondary | ICD-10-CM | POA: Diagnosis present

## 2012-05-28 DIAGNOSIS — Z79899 Other long term (current) drug therapy: Secondary | ICD-10-CM

## 2012-05-28 DIAGNOSIS — Z7982 Long term (current) use of aspirin: Secondary | ICD-10-CM

## 2012-05-28 DIAGNOSIS — D649 Anemia, unspecified: Secondary | ICD-10-CM

## 2012-05-28 DIAGNOSIS — R7402 Elevation of levels of lactic acid dehydrogenase (LDH): Secondary | ICD-10-CM | POA: Diagnosis present

## 2012-05-28 DIAGNOSIS — R7401 Elevation of levels of liver transaminase levels: Secondary | ICD-10-CM | POA: Diagnosis present

## 2012-05-28 DIAGNOSIS — F419 Anxiety disorder, unspecified: Secondary | ICD-10-CM

## 2012-05-28 DIAGNOSIS — D509 Iron deficiency anemia, unspecified: Secondary | ICD-10-CM | POA: Diagnosis present

## 2012-05-28 DIAGNOSIS — I255 Ischemic cardiomyopathy: Secondary | ICD-10-CM

## 2012-05-28 DIAGNOSIS — Z87891 Personal history of nicotine dependence: Secondary | ICD-10-CM

## 2012-05-28 DIAGNOSIS — Z8673 Personal history of transient ischemic attack (TIA), and cerebral infarction without residual deficits: Secondary | ICD-10-CM

## 2012-05-28 DIAGNOSIS — T82218A Other mechanical complication of coronary artery bypass graft, initial encounter: Secondary | ICD-10-CM

## 2012-05-28 DIAGNOSIS — I4891 Unspecified atrial fibrillation: Secondary | ICD-10-CM | POA: Diagnosis present

## 2012-05-28 DIAGNOSIS — I10 Essential (primary) hypertension: Secondary | ICD-10-CM | POA: Diagnosis present

## 2012-05-28 DIAGNOSIS — R001 Bradycardia, unspecified: Secondary | ICD-10-CM | POA: Diagnosis present

## 2012-05-28 DIAGNOSIS — I498 Other specified cardiac arrhythmias: Secondary | ICD-10-CM | POA: Diagnosis present

## 2012-05-28 DIAGNOSIS — F329 Major depressive disorder, single episode, unspecified: Secondary | ICD-10-CM

## 2012-05-28 DIAGNOSIS — F3289 Other specified depressive episodes: Secondary | ICD-10-CM | POA: Diagnosis present

## 2012-05-28 DIAGNOSIS — F411 Generalized anxiety disorder: Secondary | ICD-10-CM | POA: Diagnosis present

## 2012-05-28 DIAGNOSIS — D6959 Other secondary thrombocytopenia: Secondary | ICD-10-CM | POA: Diagnosis present

## 2012-05-28 DIAGNOSIS — N289 Disorder of kidney and ureter, unspecified: Secondary | ICD-10-CM | POA: Diagnosis present

## 2012-05-28 DIAGNOSIS — Z951 Presence of aortocoronary bypass graft: Secondary | ICD-10-CM

## 2012-05-28 DIAGNOSIS — E876 Hypokalemia: Secondary | ICD-10-CM

## 2012-05-28 DIAGNOSIS — D696 Thrombocytopenia, unspecified: Secondary | ICD-10-CM | POA: Diagnosis present

## 2012-05-28 DIAGNOSIS — I259 Chronic ischemic heart disease, unspecified: Secondary | ICD-10-CM | POA: Diagnosis present

## 2012-05-28 DIAGNOSIS — I5043 Acute on chronic combined systolic (congestive) and diastolic (congestive) heart failure: Principal | ICD-10-CM | POA: Diagnosis present

## 2012-05-28 DIAGNOSIS — J209 Acute bronchitis, unspecified: Secondary | ICD-10-CM | POA: Diagnosis present

## 2012-05-28 DIAGNOSIS — E7800 Pure hypercholesterolemia, unspecified: Secondary | ICD-10-CM

## 2012-05-28 DIAGNOSIS — T4595XA Adverse effect of unspecified primarily systemic and hematological agent, initial encounter: Secondary | ICD-10-CM | POA: Diagnosis present

## 2012-05-28 DIAGNOSIS — R079 Chest pain, unspecified: Secondary | ICD-10-CM | POA: Diagnosis present

## 2012-05-28 DIAGNOSIS — E785 Hyperlipidemia, unspecified: Secondary | ICD-10-CM | POA: Diagnosis present

## 2012-05-28 DIAGNOSIS — H3530 Unspecified macular degeneration: Secondary | ICD-10-CM | POA: Diagnosis present

## 2012-05-28 DIAGNOSIS — I480 Paroxysmal atrial fibrillation: Secondary | ICD-10-CM

## 2012-05-28 DIAGNOSIS — I2510 Atherosclerotic heart disease of native coronary artery without angina pectoris: Secondary | ICD-10-CM | POA: Diagnosis present

## 2012-05-28 DIAGNOSIS — E039 Hypothyroidism, unspecified: Secondary | ICD-10-CM | POA: Diagnosis present

## 2012-05-28 HISTORY — DX: Anxiety disorder, unspecified: F41.9

## 2012-05-28 HISTORY — DX: Unspecified macular degeneration: H35.30

## 2012-05-28 HISTORY — DX: Hypothyroidism, unspecified: E03.9

## 2012-05-28 LAB — CBC WITH DIFFERENTIAL/PLATELET
Basophils Absolute: 0 10*3/uL (ref 0.0–0.1)
Basophils Relative: 0 % (ref 0–1)
HCT: 32.9 % — ABNORMAL LOW (ref 36.0–46.0)
MCHC: 32.8 g/dL (ref 30.0–36.0)
Monocytes Absolute: 0.7 10*3/uL (ref 0.1–1.0)
Neutro Abs: 5.9 10*3/uL (ref 1.7–7.7)
Neutrophils Relative %: 76 % (ref 43–77)
RDW: 13.5 % (ref 11.5–15.5)

## 2012-05-28 LAB — HEPATIC FUNCTION PANEL
ALT: 67 U/L — ABNORMAL HIGH (ref 0–35)
AST: 61 U/L — ABNORMAL HIGH (ref 0–37)
Albumin: 3 g/dL — ABNORMAL LOW (ref 3.5–5.2)
Alkaline Phosphatase: 77 U/L (ref 39–117)
Total Protein: 6 g/dL (ref 6.0–8.3)

## 2012-05-28 LAB — BASIC METABOLIC PANEL
Chloride: 101 mEq/L (ref 96–112)
Creatinine, Ser: 1.37 mg/dL — ABNORMAL HIGH (ref 0.50–1.10)
GFR calc Af Amer: 40 mL/min — ABNORMAL LOW (ref >90)

## 2012-05-28 LAB — TROPONIN I: Troponin I: <0.3 ng/mL (ref <0.30)

## 2012-05-28 LAB — CARDIAC PANEL(CRET KIN+CKTOT+MB+TROPI)
CK, MB: 2.4 ng/mL (ref 0.3–4.0)
CK, MB: 2.2 ng/mL (ref 0.3–4.0)
Relative Index: INVALID (ref 0.0–2.5)
Total CK: 56 U/L (ref 7–177)
Troponin I: <0.3 ng/mL (ref <0.30)

## 2012-05-28 LAB — T4, FREE: Free T4: 0.49 ng/dL — ABNORMAL LOW (ref 0.80–1.80)

## 2012-05-28 MED ORDER — DILTIAZEM HCL ER COATED BEADS 180 MG PO CP24
180.0000 mg | ORAL_CAPSULE | Freq: Every day | ORAL | Status: DC
  Administered 2012-05-28 – 2012-05-29 (×2): 180 mg via ORAL
  Filled 2012-05-28 (×2): qty 1

## 2012-05-28 MED ORDER — ACETAMINOPHEN 650 MG RE SUPP: 650.0000 mg | Freq: Four times a day (QID) | RECTAL | Status: DC | PRN

## 2012-05-28 MED ORDER — ISOSORBIDE MONONITRATE ER 30 MG PO TB24
30.0000 mg | ORAL_TABLET | Freq: Every day | ORAL | Status: DC
  Administered 2012-05-28: 30 mg via ORAL
  Filled 2012-05-28: qty 1

## 2012-05-28 MED ORDER — DEXTROSE 5 % IV SOLN
1.0000 g | INTRAVENOUS | Status: DC
  Administered 2012-05-28: 1 g via INTRAVENOUS
  Filled 2012-05-28 (×2): qty 10

## 2012-05-28 MED ORDER — ONDANSETRON HCL 4 MG PO TABS: 4.0000 mg | ORAL_TABLET | Freq: Four times a day (QID) | ORAL | Status: DC | PRN

## 2012-05-28 MED ORDER — NITROGLYCERIN 2 % TD OINT
1.0000 [in_us] | TOPICAL_OINTMENT | Freq: Four times a day (QID) | TRANSDERMAL | Status: DC
  Administered 2012-05-28 – 2012-05-29 (×3): 1 [in_us] via TOPICAL
  Filled 2012-05-28 (×3): qty 1

## 2012-05-28 MED ORDER — ALUM & MAG HYDROXIDE-SIMETH 200-200-20 MG/5ML PO SUSP
30.0000 mL | Freq: Four times a day (QID) | ORAL | Status: DC | PRN
  Administered 2012-05-28 – 2012-05-29 (×2): 30 mL via ORAL
  Filled 2012-05-28 (×4): qty 30

## 2012-05-28 MED ORDER — FUROSEMIDE 10 MG/ML IJ SOLN
40.0000 mg | Freq: Two times a day (BID) | INTRAMUSCULAR | Status: DC
  Administered 2012-05-29: 40 mg via INTRAVENOUS
  Filled 2012-05-28: qty 4

## 2012-05-28 MED ORDER — POTASSIUM CHLORIDE CRYS ER 20 MEQ PO TBCR: 20.0000 meq | EXTENDED_RELEASE_TABLET | Freq: Every day | ORAL | Status: DC

## 2012-05-28 MED ORDER — FERROUS GLUCONATE 324 (38 FE) MG PO TABS
240.0000 mg | ORAL_TABLET | Freq: Every day | ORAL | Status: DC
  Administered 2012-05-28 – 2012-05-29 (×2): 324 mg via ORAL
  Filled 2012-05-28 (×3): qty 1

## 2012-05-28 MED ORDER — AZITHROMYCIN 250 MG PO TABS
250.0000 mg | ORAL_TABLET | Freq: Every day | ORAL | Status: DC
  Administered 2012-05-29: 250 mg via ORAL
  Filled 2012-05-28: qty 1

## 2012-05-28 MED ORDER — PANTOPRAZOLE SODIUM 40 MG IV SOLR
40.0000 mg | Freq: Once | INTRAVENOUS | Status: AC
  Administered 2012-05-28: 40 mg via INTRAVENOUS
  Filled 2012-05-28: qty 40

## 2012-05-28 MED ORDER — TEMAZEPAM 15 MG PO CAPS: 15.0000 mg | ORAL_CAPSULE | Freq: Every evening | ORAL | Status: DC | PRN

## 2012-05-28 MED ORDER — ENOXAPARIN SODIUM 30 MG/0.3ML ~~LOC~~ SOLN
30.0000 mg | SUBCUTANEOUS | Status: DC
  Administered 2012-05-28: 30 mg via SUBCUTANEOUS
  Filled 2012-05-28: qty 0.3

## 2012-05-28 MED ORDER — IPRATROPIUM BROMIDE 0.02 % IN SOLN
0.5000 mg | Freq: Once | RESPIRATORY_TRACT | Status: AC
  Administered 2012-05-28: 0.5 mg via RESPIRATORY_TRACT
  Filled 2012-05-28: qty 2.5

## 2012-05-28 MED ORDER — ACETAMINOPHEN 325 MG PO TABS: 650.0000 mg | ORAL_TABLET | Freq: Four times a day (QID) | ORAL | Status: DC | PRN

## 2012-05-28 MED ORDER — ASPIRIN EC 81 MG PO TBEC
81.0000 mg | DELAYED_RELEASE_TABLET | Freq: Every day | ORAL | Status: DC
  Administered 2012-05-28 – 2012-05-29 (×2): 81 mg via ORAL
  Filled 2012-05-28 (×2): qty 1

## 2012-05-28 MED ORDER — ALPRAZOLAM 0.25 MG PO TABS
0.2500 mg | ORAL_TABLET | Freq: Two times a day (BID) | ORAL | Status: DC
  Administered 2012-05-28 – 2012-05-29 (×3): 0.25 mg via ORAL
  Filled 2012-05-28 (×3): qty 1

## 2012-05-28 MED ORDER — OXYCODONE HCL 5 MG PO TABS: 5.0000 mg | ORAL_TABLET | ORAL | Status: DC | PRN

## 2012-05-28 MED ORDER — BENZONATATE 100 MG PO CAPS
100.0000 mg | ORAL_CAPSULE | Freq: Three times a day (TID) | ORAL | Status: DC
  Administered 2012-05-28 – 2012-05-29 (×3): 100 mg via ORAL
  Filled 2012-05-28 (×4): qty 1

## 2012-05-28 MED ORDER — ONDANSETRON HCL 4 MG/2ML IJ SOLN: 4.0000 mg | Freq: Four times a day (QID) | INTRAMUSCULAR | Status: DC | PRN

## 2012-05-28 MED ORDER — LEVALBUTEROL HCL 0.63 MG/3ML IN NEBU
0.6300 mg | INHALATION_SOLUTION | Freq: Four times a day (QID) | RESPIRATORY_TRACT | Status: DC
  Administered 2012-05-28 – 2012-05-29 (×3): 0.63 mg via RESPIRATORY_TRACT
  Filled 2012-05-28 (×3): qty 3

## 2012-05-28 MED ORDER — POTASSIUM CHLORIDE CRYS ER 20 MEQ PO TBCR
20.0000 meq | EXTENDED_RELEASE_TABLET | Freq: Two times a day (BID) | ORAL | Status: DC
  Administered 2012-05-28 – 2012-05-29 (×3): 20 meq via ORAL
  Filled 2012-05-28 (×3): qty 1

## 2012-05-28 MED ORDER — ALBUTEROL SULFATE (5 MG/ML) 0.5% IN NEBU
5.0000 mg | INHALATION_SOLUTION | Freq: Once | RESPIRATORY_TRACT | Status: AC
  Administered 2012-05-28: 5 mg via RESPIRATORY_TRACT
  Filled 2012-05-28: qty 1

## 2012-05-28 MED ORDER — FUROSEMIDE 10 MG/ML IJ SOLN
40.0000 mg | Freq: Once | INTRAMUSCULAR | Status: AC
  Administered 2012-05-28: 40 mg via INTRAVENOUS
  Filled 2012-05-28: qty 4

## 2012-05-28 MED ORDER — NITROGLYCERIN 0.4 MG SL SUBL
0.4000 mg | SUBLINGUAL_TABLET | SUBLINGUAL | Status: DC | PRN
  Administered 2012-05-28 (×2): 0.4 mg via SUBLINGUAL
  Filled 2012-05-28 (×2): qty 25

## 2012-05-28 MED ORDER — POLYVINYL ALCOHOL 1.4 % OP SOLN
2.0000 [drp] | Freq: Two times a day (BID) | OPHTHALMIC | Status: DC
  Administered 2012-05-28 – 2012-05-29 (×3): 2 [drp] via OPHTHALMIC
  Filled 2012-05-28 (×2): qty 15

## 2012-05-28 MED ORDER — ATORVASTATIN CALCIUM 20 MG PO TABS
20.0000 mg | ORAL_TABLET | Freq: Every day | ORAL | Status: DC
  Administered 2012-05-28 – 2012-05-29 (×2): 20 mg via ORAL
  Filled 2012-05-28 (×2): qty 1

## 2012-05-28 MED ORDER — PANTOPRAZOLE SODIUM 40 MG PO TBEC
40.0000 mg | DELAYED_RELEASE_TABLET | Freq: Every day | ORAL | Status: DC
  Administered 2012-05-28 – 2012-05-29 (×2): 40 mg via ORAL
  Filled 2012-05-28 (×2): qty 1

## 2012-05-28 MED ORDER — AZITHROMYCIN 250 MG PO TABS
500.0000 mg | ORAL_TABLET | Freq: Every day | ORAL | Status: AC
  Administered 2012-05-28: 500 mg via ORAL
  Filled 2012-05-28: qty 2

## 2012-05-28 MED ORDER — AMIODARONE HCL 200 MG PO TABS
200.0000 mg | ORAL_TABLET | Freq: Every day | ORAL | Status: DC
  Administered 2012-05-28 – 2012-05-29 (×2): 200 mg via ORAL
  Filled 2012-05-28 (×2): qty 1

## 2012-05-28 MED ORDER — METOPROLOL SUCCINATE ER 25 MG PO TB24
25.0000 mg | ORAL_TABLET | Freq: Two times a day (BID) | ORAL | Status: DC
  Administered 2012-05-28 – 2012-05-29 (×2): 25 mg via ORAL
  Filled 2012-05-28 (×3): qty 1

## 2012-05-28 MED ORDER — CLOPIDOGREL BISULFATE 75 MG PO TABS
75.0000 mg | ORAL_TABLET | Freq: Every day | ORAL | Status: DC
  Administered 2012-05-28 – 2012-05-29 (×2): 75 mg via ORAL
  Filled 2012-05-28 (×2): qty 1

## 2012-05-28 NOTE — ED Notes (Signed)
bsd commode given to pt

## 2012-05-28 NOTE — ED Notes (Signed)
Patient repositioned

## 2012-05-28 NOTE — ED Notes (Signed)
Dr. Sherrie Mustache in at this time to examine pt

## 2012-05-28 NOTE — H&P (Addendum)
[facility] History and Physical  [name]:[id number] DOB: [birth year] DOA: 05/28/2012  Referring physician: Donnetta Hutching, M.D. PCP: Cassell Clement, M.D.   Chief Complaint: Shortness of breath  HPI:  The patient is an 78 year old woman with a history significant for ischemic heart disease/CAD, paroxysmal atrial fibrillation, and chronic systolic and diastolic heart failure who presents to the emergency department today with a chief complaint of shortness of breath. Her symptoms started 2-3 days ago. It started with a nonproductive and a productive cough with green sputum. She has had central chest discomfort which she does not describe as pain, but she says that it feels like she has pneumonia. She had substernal chest pain one week ago which was relieved with sublingual nitroglycerin. She denies pleurisy. She denies fever but she has had chills. She has had some shortness of breath with activity, but no shortness of breath when she lays flat. She has had a mild increase in swelling around her ankles. She denies a sore throat and earache, but she has had some hoarseness. She denies nausea, vomiting, diarrhea, or pain with urination. She has not missed any doses of her medications. She tries not to add salt to her food but she says that all of the foods she eats has salt.  In the emergency department, she is noted to be afebrile and hemodynamically stable, but mildly bradycardic with a heart rate of 58 beats per minute. Her chest x-ray reveals prominent lung markings, raising concern for interstitial pulmonary edema, but no focal airspace disease. Her EKG reveals sinus bradycardia with a heart rate of 59 beats per minute and lateral T wave changes. Her lab data are significant for a pro BNP of 4427, normal troponin I., BUN of 37, creatinine of 1.37, hemoglobin of 10.8, and platelet count of 140. She is being admitted for further evaluation and management.  Review of Systems:  As above  in history present illness. In addition, she has some visual difficulty because of macular degeneration. She also has dry eyes. She has had some generalized weakness. Otherwise, review of systems is negative.  Past Medical History  Diagnosis Date  . Stroke   . Coronary artery disease     remote CABG in 1989 and redo in 2007; has residual stable angina  . Hypertension   . Hyperlipidemia   . PAF (paroxysmal atrial fibrillation)   . High risk medication use     on amiodarone  . Systolic and diastolic CHF, chronic     EF 45% per echo in 2012  . Anemia   . Depression   . Anxiety   . Macular degeneration   . Chronic anxiety    Past Surgical History  Procedure Date  . Coronary artery bypass graft     1989 with redo 2007  . Abdominal hysterectomy    Social History: She is retired. She lives alone. She is widowed. She has no children. She has family members and friends who come to help when she needs it. She has a remote history of tobacco use. She denies alcohol and illicit drug use. She does not drive.   No Known Allergies  Family history: Her mother died of a stroke. Her father died of a traumatic accident.  Prior to Admission medications   Medication Sig Start Date End Date Taking? Authorizing Provider  amiodarone (PACERONE) 200 MG tablet Take 200 mg by mouth daily. 11/02/11 11/01/12 Yes Cassell Clement, MD  aspirin EC 81 MG tablet Take 81 mg by  mouth daily.   Yes Historical Provider, MD  atorvastatin (LIPITOR) 20 MG tablet Take 20 mg by mouth daily.   Yes Historical Provider, MD  clopidogrel (PLAVIX) 75 MG tablet Take 75 mg by mouth daily.   Yes Historical Provider, MD  diltiazem (DILACOR XR) 180 MG 24 hr capsule Take 180 mg by mouth daily.   Yes Historical Provider, MD  ferrous gluconate (FERGON) 240 (27 FE) MG tablet Take 240 mg by mouth daily.   Yes Historical Provider, MD  furosemide (LASIX) 40 MG tablet Take 1 tablet (40 mg total) by mouth daily. 12/18/11  Yes Rhonda G  Barrett, PA  isosorbide mononitrate (IMDUR) 60 MG 24 hr tablet Take 30 mg by mouth at bedtime. 07/20/11  Yes Cassell Clement, MD  metoprolol succinate (TOPROL-XL) 25 MG 24 hr tablet TAKE 1 TABLET BY MOUTH TWICE A DAY 05/14/12  Yes Cassell Clement, MD  nitroGLYCERIN (NITROSTAT) 0.6 MG SL tablet Place 0.6 mg under the tongue every 5 (five) minutes x 3 doses as needed. For chest pain   Yes Historical Provider, MD  pantoprazole (PROTONIX) 40 MG tablet TAKE 1 TABLET BY MOUTH EVERY DAY 02/14/12  Yes Cassell Clement, MD  potassium chloride SA (K-DUR,KLOR-CON) 20 MEQ tablet Take 1 tablet (20 mEq total) by mouth daily. 12/18/11 12/17/12 Yes Rhonda G Barrett, PA  ALPRAZolam (XANAX) 0.25 MG tablet Take 1 tablet (0.25 mg total) by mouth 2 (two) times daily. 05/23/12 06/22/12  Cassell Clement, MD  temazepam (RESTORIL) 15 MG capsule Take 15 mg by mouth at bedtime as needed. For sleep    Cassell Clement, MD   Physical Exam: Filed Vitals:   05/28/12 0801 05/28/12 0908 05/28/12 1049 05/28/12 1215  BP: 150/63 138/46 132/55 120/51  Pulse: 58 67 59 58  Temp:      TempSrc:      Resp: 18 18 18 18   Height:      Weight:      SpO2: 93% 99% 99% 98%     General:  Pleasant alert 78 year old Caucasian woman, sitting up in bed, eating lunch. No acute distress.  Eyes: Pupils are equal, round, and reactive to light. Extraocular movement are intact. Conjunctivae are mildly injected. Sclerae are white.  ENT: Tympanic membranes are clear bilaterally. Nasal mucosa is dry. Oropharynx reveals good dentition. He was membranes are moist.  Neck: Supple, no adenopathy, no thyromegaly, no JVD.   Cardiovascular: S1, S2, with a soft systolic murmur and borderline bradycardia. Will healed sternotomy scar.  Respiratory: Rare wheezes and occasional crackles in the bases.  Abdomen: Mildly protuberant, positive bowel sounds, soft, nontender, nondistended.  Skin: Fair turgor. No evidence of jaundice or rash.  Musculoskeletal: No  acute hot red joints. Pedal pulses palpable. No pedal edema.  Psychiatric: She is alert and oriented x3. Her affect is pleasant. Her speech is clear.  Neurologic: Cranial nerves II through XII are grossly intact. Sensation is grossly intact. Strength is 5 over 5 in the sitting position.  Labs on Admission:  Basic Metabolic Panel:  Lab 05/28/12 1610  NA 141  K 3.9  CL 101  CO2 34*  GLUCOSE 110*  BUN 37*  CREATININE 1.37*  CALCIUM 9.1  MG --  PHOS --   Liver Function Tests: No results found for this basename: AST:5,ALT:5,ALKPHOS:5,BILITOT:5,PROT:5,ALBUMIN:5 in the last 168 hours No results found for this basename: LIPASE:5,AMYLASE:5 in the last 168 hours No results found for this basename: AMMONIA:5 in the last 168 hours CBC:  Lab 05/28/12 1015  WBC 7.8  NEUTROABS 5.9  HGB 10.8*  HCT 32.9*  MCV 89.2  PLT 140*   Cardiac Enzymes:  Lab 05/28/12 1015  CKTOTAL --  CKMB --  CKMBINDEX --  TROPONINI <0.30    BNP (last 3 results)  Basename 05/28/12 1015  PROBNP 4427.0*   CBG: No results found for this basename: GLUCAP:5 in the last 168 hours  Radiological Exams on Admission: Dg Chest 2 View  05/28/2012  *RADIOLOGY REPORT*  Clinical Data: Shortness of breath and weakness.  CHEST - 2 VIEW  Comparison: 06/02/2011 and 12/17/2011  Findings: Postsurgical changes consistent with a CABG procedure. There are prominent interstitial markings bilaterally.  The pulmonary edema has decreased compared to the previous examination but there may be interstitial pulmonary edema.  Heart size is within normal limits and stable.  Trachea is midline.  No evidence for pleural effusions.  IMPRESSION:  Prominent lung markings raise concern for interstitial pulmonary edema.  No focal airspace disease.  Original Report Authenticated By: Richarda Overlie, M.D.    EKG: Sinus bradycardia with a heart rate of 59 beats per minute and nonspecific T-wave abnormalities.  Assessment/Plan Principal Problem:   *Acute on chronic combined systolic and diastolic congestive heart failure Active Problems:  Acute bronchitis  Chest pain  Ischemic heart disease  Anemia  Bradycardia  Acute renal failure  Thrombocytopenia   1. Pleasant 77 year old woman who presents with shortness of breath. Etiology is likely concomitant acute on chronic systolic/diastolic congestive heart failure exacerbation and acute bronchitis. She has had chest discomfort which she minimizes, but given her history of coronary artery disease, myocardial infarction will need to be ruled out. She does not appear to be in much respiratory distress, but given her past medical history, admission for IV Lasix and antibiotics is warranted for at least 24 hours. Her bradycardia is likely secondary to amiodarone, Toprol, and diltiazem. Parameters will be ordered to avoid symptomatic bradycardia. She is noted to have renal insufficiency. Her creatinine a few months ago was 1.1. Today it is 1.37. It is unclear at this point if she has hypovolemic or hypervolemic hyponatremia.    Plan: 1. The patient was given 40 mg of IV Lasix in the emergency department. Will continue IV Lasix every 24 hours. 2. Will start him. Antibiotic treatment with Rocephin and azithromycin. 3. Will add bronchodilator therapy with Xopenex nebulizers. 4. Symptomatic treatment for her cough with Tessalon Perles. 5. Hold parameters will be ordered for diltiazem and Toprol-XL for a heart rate of less than 58 beats per minute. We'll also consider hold parameters for amiodarone. 6. Continue all other chronic medications including antiplatelet therapy, statin, isosorbide mononitrate, and supplemental nitroglycerin as needed.Maryclare Labrador watch platelet count closely on prophylactic Lovenox and antiplatelet therapy. 7. For further evaluation, we'll order cardiac enzymes, TSH, free T4, and hepatic function panel.   Code Status: Full Family Communication:  Disposition Plan: Home when  medically improved and stable.  Time spent: One hour  [facility] Pager [phone]  If 7PM-7AM, please contact night-coverage [email or web address] Password [email or web address] 05/28/2012, 1:53 PM

## 2012-05-28 NOTE — ED Notes (Signed)
Pt called EMS for increase of SOB that started last night. Productive cough with green sputum per pt. Pt speaks complete sentences without difficulty. resp even/nonlabored.

## 2012-05-28 NOTE — ED Provider Notes (Addendum)
History    This chart was scribed for Donnetta Hutching, MD, MD by Smitty Pluck. The patient was seen in room APA18 and the patient's care was started at 7:34AM.   CSN: [id number]  Arrival date & time 05/28/12  0719   First MD Initiated Contact with Patient 05/28/12 0725      Chief Complaint  Patient presents with  . Shortness of Breath    (Consider location/radiation/quality/duration/timing/severity/associated sxs/prior treatment) The history is provided by the patient.   [name] is a 78 y.o. female who presents to the Emergency Department BIB EMS complaining of moderate productive cough onset 3 days ago and SOB onset 1 days ago at night. Pt reports that she has greenish sputum. She lives at home. Pt reports decreased food intake. Pt thinks that she has pneumonia. Reports that she is wheezing. Symptoms have been constant since onset. Denies radiation.  Cardiologist is Dr. Argentina Donovan  Past Medical History  Diagnosis Date  . Stroke   . Coronary artery disease     remote CABG in 1989 and redo in 2007; has residual stable angina  . Hypertension   . Hyperlipidemia   . PAF (paroxysmal atrial fibrillation)   . High risk medication use     on amiodarone  . Systolic and diastolic CHF, chronic     EF 45% per echo in 2012  . Anemia   . Depression   . Anxiety     Past Surgical History  Procedure Date  . Coronary artery bypass graft     1989 with redo 2007    No family history on file.  History  Substance Use Topics  . Smoking status: Former Games developer  . Smokeless tobacco: Not on file  . Alcohol Use: No    OB History    Grav Para Term Preterm Abortions TAB SAB Ect Mult Living                  Review of Systems  All other systems reviewed and are negative.  10 Systems reviewed and all are negative for acute change except as noted in the HPI.   Allergies  Lopressor  Home Medications   Current Outpatient Rx  Name Route Sig Dispense Refill  . ALPRAZOLAM 0.25 MG  PO TABS Oral Take 1 tablet (0.25 mg total) by mouth 2 (two) times daily. 60 tablet 2  . AMIODARONE HCL 200 MG PO TABS Oral Take 200 mg by mouth daily.    . ASPIRIN EC 81 MG PO TBEC Oral Take 81 mg by mouth daily.    . ATORVASTATIN CALCIUM 20 MG PO TABS Oral Take 20 mg by mouth daily.    Marland Kitchen CLOPIDOGREL BISULFATE 75 MG PO TABS Oral Take 75 mg by mouth daily.    Marland Kitchen DILTIAZEM HCL ER 180 MG PO CP24 Oral Take 180 mg by mouth daily.    Marland Kitchen DILTIAZEM HCL ER BEADS 180 MG PO CP24 Oral Take 180 mg by mouth daily.    Marland Kitchen FERROUS GLUCONATE IRON 246 (28 FE) MG PO TABS Oral Take 27 mg by mouth daily with breakfast.      . FUROSEMIDE 40 MG PO TABS Oral Take 1 tablet (40 mg total) by mouth daily. 30 tablet 11  . ISOSORBIDE MONONITRATE ER 60 MG PO TB24 Oral Take 30 mg by mouth at bedtime.    Marland Kitchen METOPROLOL SUCCINATE ER 25 MG PO TB24  TAKE 1 TABLET BY MOUTH TWICE A DAY 180 tablet 3  . NITROSTAT 0.4  MG SL SUBL  TAKE 1 TABLET SUBLINGUALLY AS NEEDED FOR CHEST PAIN 25 tablet 4  . PANTOPRAZOLE SODIUM 40 MG PO TBEC Oral Take 40 mg by mouth daily.    Marland Kitchen PANTOPRAZOLE SODIUM 40 MG PO TBEC  TAKE 1 TABLET BY MOUTH EVERY DAY 90 tablet 3  . POTASSIUM CHLORIDE CRYS ER 20 MEQ PO TBCR Oral Take 1 tablet (20 mEq total) by mouth daily. 30 tablet 11  . TEMAZEPAM 15 MG PO CAPS Oral Take 1 capsule (15 mg total) by mouth at bedtime as needed for sleep. 30 capsule 2  . TEMAZEPAM 15 MG PO CAPS Oral Take 1 capsule (15 mg total) by mouth at bedtime as needed for sleep. 30 capsule 3    BP 150/63  Pulse 55  Temp 98.5 F (36.9 C) (Oral)  Resp 20  Ht 5\' 3"  (1.6 m)  Wt 106 lb (48.081 kg)  BMI 18.78 kg/m2  SpO2 97%  Physical Exam  Nursing note and vitals reviewed. Constitutional: She is oriented to person, place, and time. She appears well-developed and well-nourished. No distress.       Slightly dehydrated   HENT:  Head: Normocephalic and atraumatic.  Eyes: Conjunctivae and EOM are normal. Pupils are equal, round, and reactive to light.        Watery eyes  Neck: Normal range of motion. Neck supple.  Cardiovascular: Normal rate, regular rhythm and normal heart sounds.   Pulmonary/Chest: Effort normal and breath sounds normal. No respiratory distress. She exhibits no tenderness.  Abdominal: Soft. Bowel sounds are normal.  Musculoskeletal: Normal range of motion.  Neurological: She is alert and oriented to person, place, and time.  Skin: Skin is warm and dry.  Psychiatric: She has a normal mood and affect. Her behavior is normal.       Lucid     ED Course  Procedures (including critical care time) DIAGNOSTIC STUDIES: Oxygen Saturation is 97% on [place], normal by my interpretation.    COORDINATION OF CARE: 7:36AM EDP discusses pt ED treatment with pt (Chest Xray, breathing treatment, abx)   7:45AM EDP ordered medication:  Scheduled Meds:    . albuterol  5 mg Nebulization Once  . furosemide  40 mg Intravenous Once  . ipratropium  0.5 mg Nebulization Once   Continuous Infusions:  PRN Meds:.   Labs Reviewed  CBC WITH DIFFERENTIAL - Abnormal; Notable for the following:    RBC 3.69 (*)     Hemoglobin 10.8 (*)     HCT 32.9 (*)     Platelets 140 (*)     All other components within normal limits  BASIC METABOLIC PANEL - Abnormal; Notable for the following:    CO2 34 (*)     Glucose, Bld 110 (*)     BUN 37 (*)     Creatinine, Ser 1.37 (*)     GFR calc non Af Amer 34 (*)     GFR calc Af Amer 40 (*)     All other components within normal limits  PRO B NATRIURETIC PEPTIDE - Abnormal; Notable for the following:    Pro B Natriuretic peptide (BNP) 4427.0 (*)     All other components within normal limits  TROPONIN I   Dg Chest 2 View  05/28/2012  *RADIOLOGY REPORT*  Clinical Data: Shortness of breath and weakness.  CHEST - 2 VIEW  Comparison: 06/02/2011 and 12/17/2011  Findings: Postsurgical changes consistent with a CABG procedure. There are prominent interstitial markings bilaterally.  The pulmonary  edema has decreased  compared to the previous examination but there may be interstitial pulmonary edema.  Heart size is within normal limits and stable.  Trachea is midline.  No evidence for pleural effusions.  IMPRESSION:  Prominent lung markings raise concern for interstitial pulmonary edema.  No focal airspace disease.  Original Report Authenticated By: Richarda Overlie, M.D.    Date: 05/28/2012  Rate: 59  Rhythm: sinus bradycardia  QRS Axis: normal  Intervals: normal  ST/T Wave abnormalities: normal  Conduction Disutrbances:none  Narrative Interpretation:   Old EKG Reviewed: unchanged   No diagnosis found.    MDM  Chest x-ray suggests early failure. BNP 4427.  IV Lasix. Admit to general medicine. Troponin negative.      I personally performed the services described in this documentation, which was scribed in my presence. The recorded information has been reviewed and considered.    Donnetta Hutching, MD 05/28/12 1316  Donnetta Hutching, MD 05/29/12 [phone]

## 2012-05-29 ENCOUNTER — Encounter (HOSPITAL_COMMUNITY): Payer: Self-pay | Admitting: Internal Medicine

## 2012-05-29 DIAGNOSIS — I498 Other specified cardiac arrhythmias: Secondary | ICD-10-CM

## 2012-05-29 DIAGNOSIS — E039 Hypothyroidism, unspecified: Secondary | ICD-10-CM

## 2012-05-29 HISTORY — DX: Hypothyroidism, unspecified: E03.9

## 2012-05-29 LAB — PRO B NATRIURETIC PEPTIDE: Pro B Natriuretic peptide (BNP): 6219 pg/mL — ABNORMAL HIGH (ref 0–450)

## 2012-05-29 LAB — COMPREHENSIVE METABOLIC PANEL
AST: 65 U/L — ABNORMAL HIGH (ref 0–37)
BUN: 27 mg/dL — ABNORMAL HIGH (ref 6–23)
CO2: 33 mEq/L — ABNORMAL HIGH (ref 19–32)
Chloride: 103 mEq/L (ref 96–112)
Creatinine, Ser: 1.15 mg/dL — ABNORMAL HIGH (ref 0.50–1.10)
GFR calc non Af Amer: 42 mL/min — ABNORMAL LOW (ref >90)
Glucose, Bld: 115 mg/dL — ABNORMAL HIGH (ref 70–99)
Total Bilirubin: 0.6 mg/dL (ref 0.3–1.2)

## 2012-05-29 LAB — CBC
MCH: 28.8 pg (ref 26.0–34.0)
Platelets: 146 10*3/uL — ABNORMAL LOW (ref 150–400)
RBC: 3.71 MIL/uL — ABNORMAL LOW (ref 3.87–5.11)

## 2012-05-29 MED ORDER — AZITHROMYCIN 250 MG PO TABS: ORAL_TABLET | ORAL | Status: AC

## 2012-05-29 MED ORDER — LEVALBUTEROL TARTRATE 45 MCG/ACT IN AERO: 2.0000 | INHALATION_SPRAY | Freq: Two times a day (BID) | RESPIRATORY_TRACT | Status: DC

## 2012-05-29 MED ORDER — FUROSEMIDE 40 MG PO TABS: ORAL_TABLET | ORAL | Status: DC

## 2012-05-29 MED ORDER — LEVALBUTEROL TARTRATE 45 MCG/ACT IN AERO
2.0000 | INHALATION_SPRAY | Freq: Two times a day (BID) | RESPIRATORY_TRACT | Status: DC
  Administered 2012-05-29: 2 via RESPIRATORY_TRACT
  Filled 2012-05-29: qty 15

## 2012-05-29 MED ORDER — LEVOTHYROXINE SODIUM 25 MCG PO TABS
25.0000 ug | ORAL_TABLET | Freq: Every day | ORAL | Status: DC
  Administered 2012-05-29: 25 ug via ORAL
  Filled 2012-05-29: qty 1

## 2012-05-29 MED ORDER — METOPROLOL SUCCINATE ER 25 MG PO TB24: 25.0000 mg | ORAL_TABLET | Freq: Every day | ORAL | Status: DC

## 2012-05-29 MED ORDER — BENZONATATE 100 MG PO CAPS: 100.0000 mg | ORAL_CAPSULE | Freq: Three times a day (TID) | ORAL | Status: AC | PRN

## 2012-05-29 MED ORDER — LEVOTHYROXINE SODIUM 25 MCG PO TABS: 25.0000 ug | ORAL_TABLET | Freq: Every day | ORAL | Status: DC

## 2012-05-29 NOTE — Progress Notes (Signed)
Patient c/o midsternal chest pain that radiates to back, rate 8/10, unable to describe, VSS BP 124/60, HR 81, O2 95% on 2L, patient states that she takes nitro SL at home, patient given one at this time, patient anxious and tearful at times in regards to issues she has at home, EKG obtained, and a second dose of nitro was given at 2145, called Dr. Orvan Falconer, new orders given at this time

## 2012-05-29 NOTE — Progress Notes (Signed)
Pharmacist Heart Failure Core Measure Documentation  Assessment: [name] has an EF documented as 45-50% on 01/2012 by ECHO.  Rationale: Heart failure patients with left ventricular systolic dysfunction (LVSD) and an EF < 40% should be prescribed an angiotensin converting enzyme inhibitor (ACEI) or angiotensin receptor blocker (ARB) at discharge unless a contraindication is documented in the medical record.  This patient is not currently on an ACEI or ARB for HF.  This note is being placed in the record in order to provide documentation that a contraindication to the use of these agents is present for this encounter.  ACE Inhibitor or Angiotensin Receptor Blocker is contraindicated (specify all that apply)  []   ACEI allergy AND ARB allergy []   Angioedema []   Moderate or severe aortic stenosis []   Hyperkalemia []   Hypotension []   Renal artery stenosis [x]   Worsening renal function, preexisting renal disease or dysfunction   [name] 05/29/2012 10:43 AM

## 2012-05-29 NOTE — Discharge Summary (Signed)
Physician Discharge Summary  [name] ZOX:[id number] DOB: [birth year] DOA: 05/28/2012  PCP: Cassell Clement, M.D.  Admit date: 05/28/2012 Discharge date: 05/29/2012  Recommendations for Outpatient Follow-up: The patient was discharged to home. She was instructed to followup with her primary care physician in 3-5 days.   Discharge Diagnoses:  1. Mild acute on chronic systolic and diastolic congestive heart failure. 2. Concomitant acute bronchitis. 3. Newly diagnosed hypothyroidism. 4. Chest pain, likely secondary to #2. Myocardial infarction ruled out with negative cardiac enzymes. 5. History of ischemic heart disease/CAD. 6. Mild bradycardia secondary to a combination of amiodarone, diltiazem, and Toprol-XL. Toprol-XL was decreased from twice daily to once daily. 7. Mild thrombocytopenia, likely secondary to antiplatelet therapy and possibly hypothyroidism. Her platelet count was 140 on admission and 146 at the time of discharge. 8. Acute renal insufficiency. The patient's creatinine was 1.37 on admission and 1.15 at the time of discharge. 9. Chronic iron deficiency anemia. The patient's hemoglobin was 10.7 at the time of discharge. 10. Mild hepatic transaminitis. Likely secondary to statin therapy.    Discharge Condition: Improved.  Diet recommendation: Heart healthy.  Wt Readings from Last 3 Encounters:  05/28/12 48.081 kg (106 lb)  04/29/12 48.081 kg (106 lb)  01/26/12 47.628 kg (105 lb)    History of present illness:  The patient is an 78 year old woman with a history significant for ischemic heart disease, paroxysmal atrial fibrillation, and combined chronic systolic and diastolic heart failure, who presented to the emergency department on 05/28/2012 with a chief complaint of shortness of breath. In the emergency department, she was afebrile and hemodynamically stable, but mildly bradycardic with a heart rate of 58 beats per minute. Her chest x-ray revealed prominent lung  markings, questionable interstitial pulmonary edema, but no focal airspace disease. Her EKG revealed sinus bradycardia with a heart rate of 59 beats per minute and lateral T wave changes. Her proBNP was elevated at 4427. Her troponin I was normal. Her BUN was 37 and her creatinine was 1.37. Her hemoglobin was 10.8 and her platelet count was 140. She was admitted for further evaluation and management.  Hospital Course:  When I first approached the patient in the emergency department, she was sitting up in bed eating lunch and was in no acute distress. Nevertheless, she was admitted for treatment. The patient was started on IV Lasix for mild pulmonary edema. Oxygen therapy was applied to keep her oxygen saturations greater than 90-92%. Empiric antibiotic treatment with Rocephin and azithromycin for acute bronchitis was initiated. She was treated with Xopenex nebulizations for mild bronchospasms and as needed Tessalon Perles for cough. She was restarted on all of her chronic medications including amiodarone, diltiazem, and Toprol-XL. However, hold parameters were ordered for bradycardia with these medications. During the hospitalization, her heart rate ranged from 51-80 beats per minute. For further evaluation, a number of studies were ordered. All of her cardiac enzymes were well within normal limits. Her TSH was elevated at 16.2 and her free T4 was low at 0.49. She was started on treatment for hypothyroidism with Synthroid 25 mcg daily. Her followup platelet count increased to 146. Her followup hemoglobin was virtually unchanged at 10.7. Her followup proBNP was 6219, however, she had diuresed more than 1000 cc including her stay in the emergency department and was symptomatically improved despite the elevated proBNP. Her followup creatinine improved to 1.15. Her liver transaminases remained stable with an AST ranging from 61 to 65 and ALT ranging from 67 to79. The hepatic transaminitis was  likely secondary to  statin therapy. However, it was continued because the benefits outweighed the risk.  Upon discharge, the patient was instructed to continue azithromycin and Xopenex inhaler until completion. She was instructed to decrease the frequency of Toprol-XL to once daily rather than twice a day to avoid symptomatic bradycardia. She was instructed on continuing treatment for hypothyroidism with Synthroid. She was instructed to increase her home dose of furosemide from 40 mg to 60 mg daily. She is instructed to followup with Dr. Patty Sermons in a few days. She voiced understanding.  Procedures:  None  Consultations:  None  Discharge Exam: Filed Vitals:   05/29/12 0500  BP: 120/64  Pulse: 73  Temp: 99 F (37.2 C)  Resp: 18   Filed Vitals:   05/28/12 2145 05/28/12 2150 05/29/12 0500 05/29/12 0712  BP: 116/63 107/59 120/64   Pulse: 78 78 73   Temp: 99.8 F (37.7 C)  99 F (37.2 C)   TempSrc: Oral  Oral   Resp:   18   Height:      Weight:      SpO2: 94% 93% 93% 94%    General: 78 year old sitting up in bed, in no acute distress. Cardiovascular: S1, S2, with a soft systolic murmur. Respiratory: Decreased crackles and wheezes. Breathing is nonlabored. Abdomen: Positive bowel sounds, soft, nontender, nondistended. Extremities: No pedal edema. Neurologic: She is alert and oriented x3. She is able to ambulate in the room with little assistance.  Discharge Instructions  Discharge Orders    Future Orders Please Complete By Expires   Diet - low sodium heart healthy      Increase activity slowly      Discharge instructions      Comments:   You are being a prescribed a new medication called levothyroxine for treatment of your low functioning thyroid gland. The dose of Toprol-XL was decreased to once daily because of borderline low heart rate. The dose of furosemide was increased to 1-1/2 tablets daily. You should take the antibiotic and use the inhaler until completed. Followup with Dr.  Patty Sermons in 3-5 days.     Medication List  As of 05/29/2012 11:06 AM   TAKE these medications         ALPRAZolam 0.25 MG tablet   Commonly known as: XANAX   Take 1 tablet (0.25 mg total) by mouth 2 (two) times daily.      amiodarone 200 MG tablet   Commonly known as: PACERONE   Take 200 mg by mouth daily.      aspirin EC 81 MG tablet   Take 81 mg by mouth daily.      atorvastatin 20 MG tablet   Commonly known as: LIPITOR   Take 20 mg by mouth daily.      azithromycin 250 MG tablet   Commonly known as: ZITHROMAX   Starting tomorrow take 2 tablets for 1 day, then 1 tablet daily thereafter until completed.      benzonatate 100 MG capsule   Commonly known as: TESSALON   Take 1 capsule (100 mg total) by mouth 3 (three) times daily as needed for cough.      clopidogrel 75 MG tablet   Commonly known as: PLAVIX   Take 75 mg by mouth daily.      diltiazem 180 MG 24 hr capsule   Commonly known as: DILACOR XR   Take 180 mg by mouth daily.      ferrous gluconate 240 (27 FE) MG tablet  Commonly known as: FERGON   Take 240 mg by mouth daily.      furosemide 40 MG tablet   Commonly known as: LASIX   The dose of furosemide was increased to 1-1/2 tablets daily.      isosorbide mononitrate 60 MG 24 hr tablet   Commonly known as: IMDUR   Take 30 mg by mouth at bedtime.      levalbuterol 45 MCG/ACT inhaler   Commonly known as: XOPENEX HFA   Inhale 2 puffs into the lungs 2 (two) times daily.      levothyroxine 25 MCG tablet   Commonly known as: SYNTHROID, LEVOTHROID   Take 1 tablet (25 mcg total) by mouth daily before breakfast. For treatment of your low functioning thyroid gland.      metoprolol succinate 25 MG 24 hr tablet   Commonly known as: TOPROL-XL   Take 1 tablet (25 mg total) by mouth daily. The dose of this medicine was decreased from twice daily to once daily because of your borderline low heart rate.      nitroGLYCERIN 0.6 MG SL tablet   Commonly known as:  NITROSTAT   Place 0.6 mg under the tongue every 5 (five) minutes x 3 doses as needed. For chest pain      pantoprazole 40 MG tablet   Commonly known as: PROTONIX   TAKE 1 TABLET BY MOUTH EVERY DAY      potassium chloride SA 20 MEQ tablet   Commonly known as: K-DUR,KLOR-CON   Take 1 tablet (20 mEq total) by mouth daily.      temazepam 15 MG capsule   Commonly known as: RESTORIL   Take 15 mg by mouth at bedtime as needed. For sleep           Follow-up Information    Follow up with Cassell Clement, MD. Schedule an appointment as soon as possible for a visit in 3 days. (Followup in 3-5 days.)    Contact information:   [address] [place] [address] [phone]           The results of significant diagnostics from this hospitalization (including imaging, microbiology, ancillary and laboratory) are listed below for reference.    Significant Diagnostic Studies: Dg Chest 2 View  05/28/2012  *RADIOLOGY REPORT*  Clinical Data: Shortness of breath and weakness.  CHEST - 2 VIEW  Comparison: 06/02/2011 and 12/17/2011  Findings: Postsurgical changes consistent with a CABG procedure. There are prominent interstitial markings bilaterally.  The pulmonary edema has decreased compared to the previous examination but there may be interstitial pulmonary edema.  Heart size is within normal limits and stable.  Trachea is midline.  No evidence for pleural effusions.  IMPRESSION:  Prominent lung markings raise concern for interstitial pulmonary edema.  No focal airspace disease.  Original Report Authenticated By: Richarda Overlie, M.D.    Microbiology: No results found for this or any previous visit (from the past 240 hour(s)).   Labs: Basic Metabolic Panel:  Lab 05/29/12 9147 05/28/12 1015  NA 141 141  K 4.2 3.9  CL 103 101  CO2 33* 34*  GLUCOSE 115* 110*  BUN 27* 37*  CREATININE 1.15* 1.37*  CALCIUM 8.7 9.1  MG -- --  PHOS -- --   Liver Function Tests:  Lab  05/29/12 0213 05/28/12 1359  AST 65* 61*  ALT 79* 67*  ALKPHOS 71 77  BILITOT 0.6 0.7  PROT 5.5* 6.0  ALBUMIN 2.6* 3.0*  Lab 05/28/12 1923  LIPASE 20  AMYLASE --   No results found for this basename: AMMONIA:5 in the last 168 hours CBC:  Lab 05/29/12 0213 05/28/12 1015  WBC 8.2 7.8  NEUTROABS -- 5.9  HGB 10.7* 10.8*  HCT 32.8* 32.9*  MCV 88.4 89.2  PLT 146* 140*   Cardiac Enzymes:  Lab 05/29/12 0213 05/28/12 1923 05/28/12 1359 05/28/12 1015  CKTOTAL 42 56 53 --  CKMB 1.9 2.2 2.4 --  CKMBINDEX -- -- -- --  TROPONINI <0.30 <0.30 <0.30 <0.30   BNP: BNP (last 3 results)  Basename 05/28/12 1015  PROBNP 4427.0*   CBG: No results found for this basename: GLUCAP:5 in the last 168 hours  Time coordinating discharge: Greater than 35 minutes  Signed:  Monic Engelmann  [facility] 05/29/2012, 11:06 AM

## 2012-05-29 NOTE — Plan of Care (Signed)
Problem: Discharge Progression Outcomes Goal: Other Discharge Outcomes/Goals Pt discharged to home, accompanied by family.  Rx given to pt and explained. Discharge instructions given and explained.

## 2012-06-02 NOTE — Progress Notes (Signed)
UR chart review completed.  

## 2012-06-06 ENCOUNTER — Ambulatory Visit (INDEPENDENT_AMBULATORY_CARE_PROVIDER_SITE_OTHER): Payer: Medicare Other | Admitting: Cardiology

## 2012-06-06 ENCOUNTER — Encounter: Payer: Self-pay | Admitting: Cardiology

## 2012-06-06 VITALS — BP 120/60 | HR 60 | Ht 63.0 in | Wt 105.0 lb

## 2012-06-06 DIAGNOSIS — I259 Chronic ischemic heart disease, unspecified: Secondary | ICD-10-CM

## 2012-06-06 DIAGNOSIS — E032 Hypothyroidism due to medicaments and other exogenous substances: Secondary | ICD-10-CM | POA: Insufficient documentation

## 2012-06-06 DIAGNOSIS — T462X1A Poisoning by other antidysrhythmic drugs, accidental (unintentional), initial encounter: Secondary | ICD-10-CM

## 2012-06-06 DIAGNOSIS — I509 Heart failure, unspecified: Secondary | ICD-10-CM

## 2012-06-06 DIAGNOSIS — I480 Paroxysmal atrial fibrillation: Secondary | ICD-10-CM

## 2012-06-06 DIAGNOSIS — I5043 Acute on chronic combined systolic (congestive) and diastolic (congestive) heart failure: Secondary | ICD-10-CM

## 2012-06-06 DIAGNOSIS — I4891 Unspecified atrial fibrillation: Secondary | ICD-10-CM

## 2012-06-06 NOTE — Progress Notes (Signed)
[name] Date of Birth:  1926-05-03 [facility] [address] [place], [place]  [address] [phone]  Fax   [phone]  HPI: This pleasant 78 year old woman is seen for a followup office visit.  She was recently seen at [facility] for complaints of weakness.  She was found to be hypothyroid.  Her TSH was 16.2.  She has been on Synthroid 25 mcg daily for about one week.  She is starting to feel a little better. Current Outpatient Prescriptions  Medication Sig Dispense Refill  . ALPRAZolam (XANAX) 0.25 MG tablet Take 1 tablet (0.25 mg total) by mouth 2 (two) times daily.  60 tablet  2  . aspirin EC 81 MG tablet Take 81 mg by mouth daily.      Marland Kitchen atorvastatin (LIPITOR) 20 MG tablet Take 20 mg by mouth daily.      . clopidogrel (PLAVIX) 75 MG tablet Take 75 mg by mouth daily.      Marland Kitchen diltiazem (DILACOR XR) 180 MG 24 hr capsule Take 180 mg by mouth daily.      . ferrous gluconate (FERGON) 240 (27 FE) MG tablet Take 240 mg by mouth daily.      . furosemide (LASIX) 40 MG tablet The dose of furosemide was increased to 1-1/2 tablets daily.  45 tablet  2  . isosorbide mononitrate (IMDUR) 60 MG 24 hr tablet Take 30 mg by mouth at bedtime.      . levalbuterol (XOPENEX HFA) 45 MCG/ACT inhaler Inhale 2 puffs into the lungs 2 (two) times daily.  1 Inhaler    . levothyroxine (SYNTHROID, LEVOTHROID) 25 MCG tablet Take 1 tablet (25 mcg total) by mouth daily before breakfast. For treatment of your low functioning thyroid gland.  30 tablet  2  . metoprolol succinate (TOPROL-XL) 25 MG 24 hr tablet Take 1 tablet (25 mg total) by mouth daily. The dose of this medicine was decreased from twice daily to once daily because of your borderline low heart rate.  180 tablet  3  . nitroGLYCERIN (NITROSTAT) 0.6 MG SL tablet Place 0.6 mg under the tongue every 5 (five) minutes x 3 doses as needed. For chest pain      . pantoprazole (PROTONIX) 40 MG tablet TAKE 1 TABLET BY MOUTH EVERY  DAY  90 tablet  3  . potassium chloride SA (K-DUR,KLOR-CON) 20 MEQ tablet Take 1 tablet (20 mEq total) by mouth daily.  30 tablet  11  . temazepam (RESTORIL) 15 MG capsule Take 15 mg by mouth at bedtime as needed. For sleep      . benzonatate (TESSALON) 100 MG capsule Take 1 capsule (100 mg total) by mouth 3 (three) times daily as needed for cough.  20 capsule  0    No Known Allergies  Patient Active Problem List  Diagnosis  . Paroxysmal atrial fibrillation  . Ischemic heart disease  . Failed CABG (coronary artery bypass graft)  . Hypercholesterolemia  . Anxiety  . Depression  . Anemia  . Cardiomyopathy, ischemic  . Acute on chronic combined systolic and diastolic congestive heart failure  . Acute hypokalemia  . Acute bronchitis  . Bradycardia  . Acute renal failure  . Chest pain  . Thrombocytopenia  . Hypothyroidism  . Transaminitis  . Hypothyroidism due to amiodarone    History  Smoking status  . Former Smoker  Smokeless tobacco  . Not on file    History  Alcohol Use No    No family  history on file.  Review of Systems: The patient denies any heat or cold intolerance.  No weight gain or weight loss.  The patient denies headaches or blurry vision.  There is no cough or sputum production.  The patient denies dizziness.  There is no hematuria or hematochezia.  The patient denies any muscle aches or arthritis.  The patient denies any rash.  The patient denies frequent falling or instability.  There is no history of depression or anxiety.  All other systems were reviewed and are negative.   Physical Exam: Filed Vitals:   06/06/12 1521  BP: 120/60  Pulse: 60   the general appearance reveals a well-developed well-nourished elderly woman in no acute distress.  Her voice is somewhat hoarse consistent with her hypothyroidism.The head and neck exam reveals pupils equal and reactive.  Extraocular movements are full.  There is no scleral icterus.  The mouth and pharynx are  normal.  The neck is supple.  The carotids reveal no bruits.  The jugular venous pressure is normal.  The  thyroid is not enlarged.  There is no lymphadenopathy.  The chest is clear to percussion and auscultation.  There are no rales or rhonchi.  Expansion of the chest is symmetrical.  The precordium is quiet.  The first heart sound is normal.  The second heart sound is physiologically split.  There is no murmur gallop rub or click.  There is no abnormal lift or heave.  The abdomen is soft and nontender.  The bowel sounds are normal.  The liver and spleen are not enlarged.  There are no abdominal masses.  There are no abdominal bruits.  Extremities reveal good pedal pulses.  There is no phlebitis or edema.  There is no cyanosis or clubbing.  Strength is normal and symmetrical in all extremities.  There is no lateralizing weakness.  There are no sensory deficits.  The skin is warm and dry.  There is no rash.      Assessment / Plan: Impression is that the patient has developed hypothyroidism from her amiodarone usage.  She will stop amiodarone and continue present dose of Synthroid.  Recheck in one month for a followup office visit EKG TSH and free T4.

## 2012-06-06 NOTE — Patient Instructions (Addendum)
Your physician recommends that you continue on your current medications as directed. Please refer to the Current Medication list given to you today.   Your physician recommends that you schedule a follow-up appointment in: 1 month ov (t4,tsh) and ekg with  Dr. Patty Sermons or Lawson Fiscal NP  STOP AMIODARONE

## 2012-06-06 NOTE — Assessment & Plan Note (Signed)
The patient has not been expressing any recurrent chest pain or angina pectoris

## 2012-06-06 NOTE — Assessment & Plan Note (Signed)
The patient has a past history of paroxysmal atrial fibrillation.  She had been on amiodarone 200 mg daily.  It is likely that the amiodarone has caused her to become hypothyroid.  We will stop her amiodarone at this point.  If her atrial fibrillation recurs we will need to use a different agent.

## 2012-06-06 NOTE — Assessment & Plan Note (Signed)
Patient has not been experiencing any symptoms of worsening CHF

## 2012-06-13 ENCOUNTER — Other Ambulatory Visit: Payer: Self-pay | Admitting: Cardiology

## 2012-06-23 ENCOUNTER — Other Ambulatory Visit: Payer: Self-pay | Admitting: Cardiology

## 2012-06-23 DIAGNOSIS — G4700 Insomnia, unspecified: Secondary | ICD-10-CM

## 2012-06-23 MED ORDER — TEMAZEPAM 15 MG PO CAPS: 15.0000 mg | ORAL_CAPSULE | Freq: Every evening | ORAL | Status: DC | PRN

## 2012-06-23 NOTE — Telephone Encounter (Signed)
Fu call Pt's niece wants to get this rx today

## 2012-06-23 NOTE — Telephone Encounter (Signed)
Pt's dtr went to [facility] to pick up refill of restoril 15mg  [facility] [place], pharmacy faxed request yesterday, not there , pt's dtr running errands in that area and needs called in asap

## 2012-06-23 NOTE — Telephone Encounter (Signed)
Fu call Pt's niece calling back again

## 2012-06-23 NOTE — Telephone Encounter (Signed)
Refilled medication as requested.

## 2012-06-29 ENCOUNTER — Telehealth: Payer: Self-pay | Admitting: Cardiology

## 2012-06-29 NOTE — Telephone Encounter (Signed)
Call back if no better

## 2012-06-29 NOTE — Telephone Encounter (Signed)
New Problem:    Called because the patient has been itching for several days and would like to know what it is.  Patient believes it is her medication.  Patient's daughter called believing it was fleas or mosquito bites.  Please call back.

## 2012-06-29 NOTE — Telephone Encounter (Signed)
Patient has places on legs and arms that are itching and whelp like.  Patient has difficult time seeing. Has been outside recently and that's when it started. No new soaps, detergents, or lotions.  Will try OTC cortisone cream and Benedryl by mouth. Discussed with  Dr. Patty Sermons and he agrees.

## 2012-06-29 NOTE — Telephone Encounter (Signed)
Left message to call back  

## 2012-06-29 NOTE — Telephone Encounter (Signed)
Has some place on her legs and under under breast, concerned about it being her medications.  Not on anything new.

## 2012-07-06 ENCOUNTER — Other Ambulatory Visit (INDEPENDENT_AMBULATORY_CARE_PROVIDER_SITE_OTHER): Payer: Medicare Other

## 2012-07-06 ENCOUNTER — Encounter: Payer: Self-pay | Admitting: Cardiology

## 2012-07-06 ENCOUNTER — Ambulatory Visit (INDEPENDENT_AMBULATORY_CARE_PROVIDER_SITE_OTHER): Payer: Medicare Other | Admitting: Cardiology

## 2012-07-06 VITALS — BP 100/60 | HR 56 | Ht 63.0 in | Wt 108.0 lb

## 2012-07-06 DIAGNOSIS — I480 Paroxysmal atrial fibrillation: Secondary | ICD-10-CM

## 2012-07-06 DIAGNOSIS — L259 Unspecified contact dermatitis, unspecified cause: Secondary | ICD-10-CM

## 2012-07-06 DIAGNOSIS — E032 Hypothyroidism due to medicaments and other exogenous substances: Secondary | ICD-10-CM

## 2012-07-06 DIAGNOSIS — I4891 Unspecified atrial fibrillation: Secondary | ICD-10-CM

## 2012-07-06 DIAGNOSIS — L309 Dermatitis, unspecified: Secondary | ICD-10-CM | POA: Insufficient documentation

## 2012-07-06 DIAGNOSIS — I259 Chronic ischemic heart disease, unspecified: Secondary | ICD-10-CM

## 2012-07-06 DIAGNOSIS — E7800 Pure hypercholesterolemia, unspecified: Secondary | ICD-10-CM

## 2012-07-06 DIAGNOSIS — Z79899 Other long term (current) drug therapy: Secondary | ICD-10-CM

## 2012-07-06 NOTE — Assessment & Plan Note (Signed)
The patient has what appears to be probable insect bites on her lower abdomen.  These are pruritic but have responded to steroid cream

## 2012-07-06 NOTE — Progress Notes (Signed)
[name] Date of Birth:  [birth year] [facility] [address] [place], [place]  [address] [phone]         Fax   [phone]  History of Present Illness: This pleasant 77 year old woman is seen for a scheduled 3 month followup office visit. She has a history of known ischemic heart disease. She had coronary artery bypass graft surgery in 1989. He had redo CABG in October 2000. He is a past history of paroxysmal atrial fibrillation. He is not on Coumadin because of a fall risk. She is on aspirin and Plavix because of her ischemic heart disease. She has a history of hypercholesterolemia, anxiety, insomnia, and she is legally blind. Her most recent echocardiogram 01/20/12 showed an ejection fraction of 45-50%. She has grade 2 diastolic dysfunction.  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  Her amiodarone was stopped one month ago.  The patient is now on Synthroid.  She has felt better over the past month.   Current Outpatient Prescriptions  Medication Sig Dispense Refill  . aspirin EC 81 MG tablet Take 81 mg by mouth daily.      Marland Kitchen atorvastatin (LIPITOR) 20 MG tablet Take 20 mg by mouth daily.      Marland Kitchen atorvastatin (LIPITOR) 20 MG tablet TAKE 1 TABLET BY MOUTH EVERY DAY  90 tablet  3  . clopidogrel (PLAVIX) 75 MG tablet Take 75 mg by mouth daily.      Marland Kitchen diltiazem (DILACOR XR) 180 MG 24 hr capsule Take 180 mg by mouth daily.      . ferrous gluconate (FERGON) 240 (27 FE) MG tablet Take 240 mg by mouth daily.      . furosemide (LASIX) 40 MG tablet The dose of furosemide was increased to 1-1/2 tablets daily.  45 tablet  2  . isosorbide mononitrate (IMDUR) 60 MG 24 hr tablet Take 30 mg by mouth at bedtime.      . levalbuterol (XOPENEX HFA) 45 MCG/ACT inhaler Inhale 2 puffs into the lungs 2 (two) times daily.  1 Inhaler    . levothyroxine (SYNTHROID, LEVOTHROID) 25 MCG tablet Take 1 tablet (25 mcg total) by mouth daily before  breakfast. For treatment of your low functioning thyroid gland.  30 tablet  2  . metoprolol succinate (TOPROL-XL) 25 MG 24 hr tablet Take 1 tablet (25 mg total) by mouth daily. The dose of this medicine was decreased from twice daily to once daily because of your borderline low heart rate.  180 tablet  3  . nitroGLYCERIN (NITROSTAT) 0.6 MG SL tablet Place 0.6 mg under the tongue every 5 (five) minutes x 3 doses as needed. For chest pain      . pantoprazole (PROTONIX) 40 MG tablet TAKE 1 TABLET BY MOUTH EVERY DAY  90 tablet  3  . potassium chloride SA (K-DUR,KLOR-CON) 20 MEQ tablet Take 1 tablet (20 mEq total) by mouth daily.  30 tablet  11  . temazepam (RESTORIL) 15 MG capsule Take 1 capsule (15 mg total) by mouth at bedtime as needed. For sleep  30 capsule  5    Allergies  Allergen Reactions  . Amiodarone     Hypothyroidism    Patient Active Problem List  Diagnosis  . Paroxysmal atrial fibrillation  . Ischemic heart disease  . Failed CABG (coronary artery bypass graft)  . Hypercholesterolemia  . Anxiety  . Depression  . Anemia  . Cardiomyopathy, ischemic  . Acute on  chronic combined systolic and diastolic congestive heart failure  . Acute hypokalemia  . Acute bronchitis  . Bradycardia  . Acute renal failure  . Chest pain  . Thrombocytopenia  . Hypothyroidism  . Transaminitis  . Hypothyroidism due to amiodarone    History  Smoking status  . Former Smoker  Smokeless tobacco  . Not on file    History  Alcohol Use No    No family history on file.  Review of Systems: Constitutional: no fever chills diaphoresis or fatigue or change in weight.  Head and neck: no hearing loss, no epistaxis, no photophobia or visual disturbance. Respiratory: No cough, shortness of breath or wheezing. Cardiovascular: No chest pain peripheral edema, palpitations. Gastrointestinal: No abdominal distention, no abdominal pain, no change in bowel habits hematochezia or melena. Genitourinary:  No dysuria, no frequency, no urgency, no nocturia. Musculoskeletal:No arthralgias, no back pain, no gait disturbance or myalgias. Neurological: No dizziness, no headaches, no numbness, no seizures, no syncope, no weakness, no tremors. Hematologic: No lymphadenopathy, no easy bruising. Psychiatric: No confusion, no hallucinations, no sleep disturbance.    Physical Exam: Filed Vitals:   07/06/12 1617  BP: 100/60  Pulse: 56   the general appearance reveals a well-developed well-nourished woman in no distress.  Her weight is up 3 pounds since last visit and her appetite has improved.The head and neck exam reveals pupils equal and reactive.  Extraocular movements are full.  There is no scleral icterus.  The mouth and pharynx are normal.  The neck is supple.  The carotids reveal no bruits.  The jugular venous pressure is normal.  The  thyroid is not enlarged.  There is no lymphadenopathy.  The chest is clear to percussion and auscultation.  There are no rales or rhonchi.  Expansion of the chest is symmetrical.  The precordium is quiet.  The first heart sound is normal.  The second heart sound is physiologically split.  There is no murmur gallop rub or click.  There is no abnormal lift or heave.  The abdomen is soft and nontender.  The bowel sounds are normal.  The liver and spleen are not enlarged.  There are no abdominal masses.  There are no abdominal bruits.  Extremities reveal good pedal pulses.  There is no phlebitis or edema.  There is no cyanosis or clubbing.  Strength is normal and symmetrical in all extremities.  There is no lateralizing weakness.  There are no sensory deficits.  The skin is warm and dry.  There is scattered resolving papules which appear to be resolving bug bites on her lower abdominal wall   EKG shows sinus bradycardia and left ventricular hypertrophy with repolarization abnormality  Assessment / Plan:  We are rechecking her thyroid function studies today to see if she is on  the proper dose of Synthroid yet. Recheck in 3 months for followup office visit EKG and full lab work fasting

## 2012-07-06 NOTE — Patient Instructions (Addendum)
Your physician recommends that you continue on your current medications as directed. Please refer to the Current Medication list given to you today.  Your physician recommends that you schedule a follow-up appointment in: 3 months with fasting labs (LP/BMET/HFP/TSH/FREE T4) AND EKG

## 2012-07-06 NOTE — Assessment & Plan Note (Signed)
The patient has not been experiencing any significant chest pain or recurrent angina pectoris.

## 2012-07-06 NOTE — Assessment & Plan Note (Signed)
The patient is no longer on amiodarone.  EKG today shows that she is maintaining normal sinus rhythm.  She has not been aware of any palpitations

## 2012-07-07 LAB — CBC WITH DIFFERENTIAL/PLATELET
Basophils Relative: 0.3 % (ref 0.0–3.0)
Eosinophils Absolute: 0.3 10*3/uL (ref 0.0–0.7)
HCT: 36.5 % (ref 36.0–46.0)
Hemoglobin: 12.1 g/dL (ref 12.0–15.0)
Lymphocytes Relative: 24.7 % (ref 12.0–46.0)
Lymphs Abs: 1.6 10*3/uL (ref 0.7–4.0)
MCHC: 33.2 g/dL (ref 30.0–36.0)
Neutro Abs: 4.1 10*3/uL (ref 1.4–7.7)
RBC: 4.13 Mil/uL (ref 3.87–5.11)

## 2012-07-07 LAB — BASIC METABOLIC PANEL
CO2: 34 mEq/L — ABNORMAL HIGH (ref 19–32)
Calcium: 8.9 mg/dL (ref 8.4–10.5)
Chloride: 100 mEq/L (ref 96–112)
Potassium: 3.8 mEq/L (ref 3.5–5.1)
Sodium: 141 mEq/L (ref 135–145)

## 2012-07-07 NOTE — Progress Notes (Signed)
Quick Note:  Please report to patient. The recent labs are stable. Continue same medication and careful diet. Thyroid better but not where we want it yet.  Increase synthroid to 50 mcg daily. Anemia is better. Keep eating healthy diet. ______

## 2012-07-11 ENCOUNTER — Other Ambulatory Visit: Payer: Self-pay | Admitting: Cardiology

## 2012-08-18 ENCOUNTER — Other Ambulatory Visit: Payer: Self-pay | Admitting: Cardiology

## 2012-08-19 IMAGING — CR DG CHEST 1V PORT
1 series · 1 of 1 positions shown · non-contrast
Comparison: Portable chest 06/02/2011.

CLINICAL DATA: Chest pain.

PORTABLE CHEST - 1 VIEW

[view not recorded]
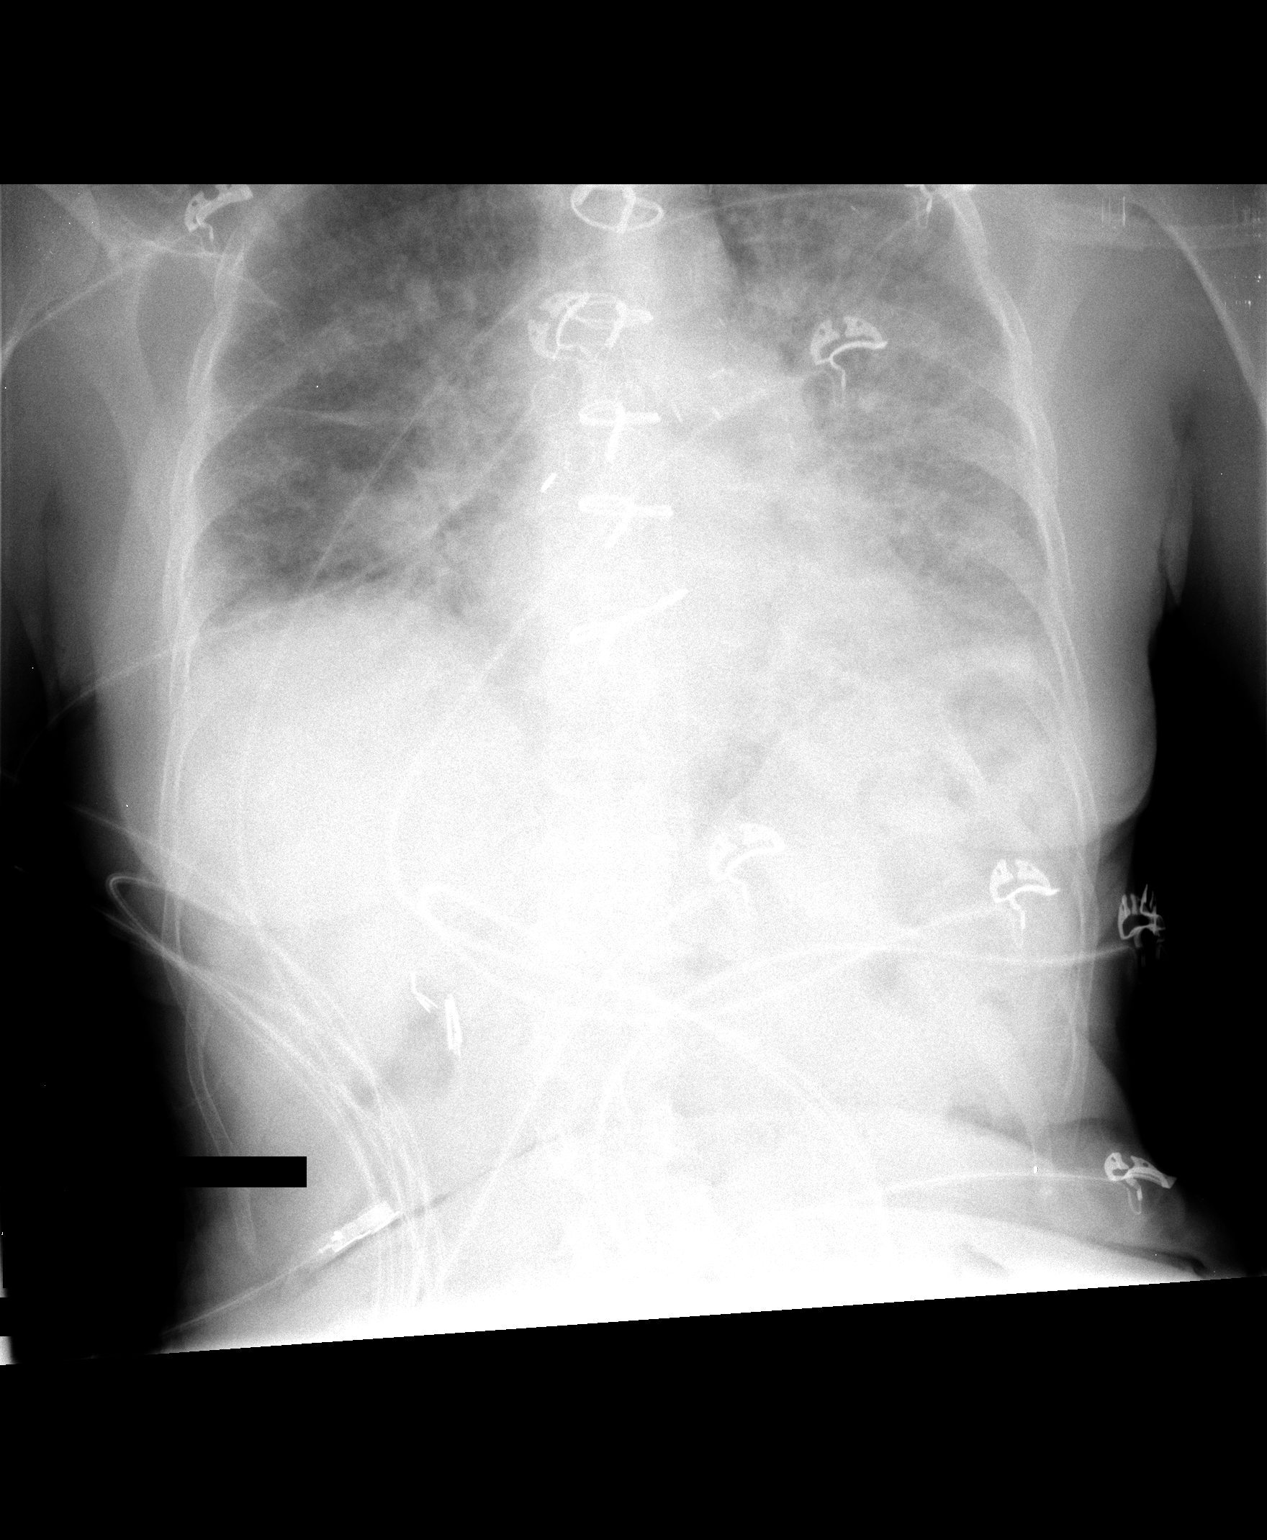

[1 of 1 positions shown; findings below may reference images not displayed]

FINDINGS: The heart size is stable.  Diffuse interstitial pattern
suggests edema.  Bibasilar airspace disease likely reflects like
atelectasis.  The patient is status post median sternotomy.
IMPRESSION: 1.  Mild cardiomegaly with a new diffuse interstitial pattern
suggesting edema and congestive heart failure.
2.  Bibasilar airspace disease likely reflects atelectasis.  Early
infection is not excluded.

## 2012-08-20 ENCOUNTER — Other Ambulatory Visit: Payer: Self-pay | Admitting: Cardiology

## 2012-08-20 DIAGNOSIS — F419 Anxiety disorder, unspecified: Secondary | ICD-10-CM

## 2012-08-22 NOTE — Telephone Encounter (Signed)
Please check this refill I didn't see it on their list.  Thanks Okey Dupre

## 2012-08-22 NOTE — Telephone Encounter (Signed)
Follow-up:    Called in needing a refill of the patient's Xanex.  Please call back.

## 2012-08-25 ENCOUNTER — Telehealth: Payer: Self-pay | Admitting: *Deleted

## 2012-08-25 DIAGNOSIS — E039 Hypothyroidism, unspecified: Secondary | ICD-10-CM

## 2012-08-25 MED ORDER — LEVOTHYROXINE SODIUM 50 MCG PO TABS: ORAL_TABLET | ORAL | Status: DC

## 2012-08-25 NOTE — Telephone Encounter (Signed)
Advised patient and sent Rx to pharmacy. 

## 2012-08-25 NOTE — Telephone Encounter (Signed)
Message copied by Burnell Blanks on Thu Aug 25, 2012  4:15 PM ------      Message from: Cassell Clement      Created: Thu Jul 07, 2012  5:48 PM       Please report to patient.  The recent labs are stable. Continue same medication and careful diet.      Thyroid better but not where we want it yet.  Increase synthroid to 50 mcg daily.      Anemia is better.  Keep eating healthy diet.

## 2012-09-19 ENCOUNTER — Other Ambulatory Visit: Payer: Self-pay | Admitting: Cardiology

## 2012-09-19 MED ORDER — ISOSORBIDE MONONITRATE ER 60 MG PO TB24: 30.0000 mg | ORAL_TABLET | Freq: Every day | ORAL | Status: DC

## 2012-10-11 ENCOUNTER — Ambulatory Visit: Payer: Medicare Other | Admitting: Cardiology

## 2012-10-11 ENCOUNTER — Other Ambulatory Visit: Payer: Medicare Other

## 2012-11-10 ENCOUNTER — Other Ambulatory Visit: Payer: Self-pay | Admitting: *Deleted

## 2012-11-10 DIAGNOSIS — I255 Ischemic cardiomyopathy: Secondary | ICD-10-CM

## 2012-11-10 DIAGNOSIS — E039 Hypothyroidism, unspecified: Secondary | ICD-10-CM

## 2012-11-10 DIAGNOSIS — E7800 Pure hypercholesterolemia, unspecified: Secondary | ICD-10-CM

## 2012-11-10 DIAGNOSIS — I259 Chronic ischemic heart disease, unspecified: Secondary | ICD-10-CM

## 2012-11-10 DIAGNOSIS — N179 Acute kidney failure, unspecified: Secondary | ICD-10-CM

## 2012-11-10 DIAGNOSIS — I480 Paroxysmal atrial fibrillation: Secondary | ICD-10-CM

## 2012-11-10 DIAGNOSIS — I5043 Acute on chronic combined systolic (congestive) and diastolic (congestive) heart failure: Secondary | ICD-10-CM

## 2012-11-11 ENCOUNTER — Encounter: Payer: Self-pay | Admitting: Cardiology

## 2012-11-11 ENCOUNTER — Other Ambulatory Visit (INDEPENDENT_AMBULATORY_CARE_PROVIDER_SITE_OTHER): Payer: Medicare Other

## 2012-11-11 ENCOUNTER — Ambulatory Visit (INDEPENDENT_AMBULATORY_CARE_PROVIDER_SITE_OTHER): Payer: Medicare Other | Admitting: Cardiology

## 2012-11-11 VITALS — BP 110/52 | HR 71 | Ht 63.0 in | Wt 106.1 lb

## 2012-11-11 DIAGNOSIS — I4891 Unspecified atrial fibrillation: Secondary | ICD-10-CM

## 2012-11-11 DIAGNOSIS — I259 Chronic ischemic heart disease, unspecified: Secondary | ICD-10-CM

## 2012-11-11 DIAGNOSIS — I255 Ischemic cardiomyopathy: Secondary | ICD-10-CM

## 2012-11-11 DIAGNOSIS — I2589 Other forms of chronic ischemic heart disease: Secondary | ICD-10-CM

## 2012-11-11 DIAGNOSIS — I509 Heart failure, unspecified: Secondary | ICD-10-CM

## 2012-11-11 DIAGNOSIS — E039 Hypothyroidism, unspecified: Secondary | ICD-10-CM

## 2012-11-11 DIAGNOSIS — I480 Paroxysmal atrial fibrillation: Secondary | ICD-10-CM

## 2012-11-11 DIAGNOSIS — E032 Hypothyroidism due to medicaments and other exogenous substances: Secondary | ICD-10-CM

## 2012-11-11 DIAGNOSIS — E7800 Pure hypercholesterolemia, unspecified: Secondary | ICD-10-CM

## 2012-11-11 DIAGNOSIS — N179 Acute kidney failure, unspecified: Secondary | ICD-10-CM

## 2012-11-11 DIAGNOSIS — I5043 Acute on chronic combined systolic (congestive) and diastolic (congestive) heart failure: Secondary | ICD-10-CM

## 2012-11-11 LAB — LIPID PANEL
Cholesterol: 132 mg/dL (ref 0–200)
HDL: 54.8 mg/dL
LDL Cholesterol: 55 mg/dL (ref 0–99)
Total CHOL/HDL Ratio: 2
Triglycerides: 112 mg/dL (ref 0.0–149.0)
VLDL: 22.4 mg/dL (ref 0.0–40.0)

## 2012-11-11 LAB — BASIC METABOLIC PANEL
BUN: 27 mg/dL — ABNORMAL HIGH (ref 6–23)
CO2: 31 mEq/L (ref 19–32)
Chloride: 103 mEq/L (ref 96–112)
Potassium: 3.8 mEq/L (ref 3.5–5.1)

## 2012-11-11 LAB — HEPATIC FUNCTION PANEL
Albumin: 3.5 g/dL (ref 3.5–5.2)
Total Protein: 6.3 g/dL (ref 6.0–8.3)

## 2012-11-11 LAB — TSH: TSH: 5.41 u[IU]/mL (ref 0.35–5.50)

## 2012-11-11 NOTE — Assessment & Plan Note (Signed)
Clinically she appears to be euthyroid.  However the skin is still somewhat dry.  She is losing weight.  TSH pending

## 2012-11-11 NOTE — Progress Notes (Signed)
Quick Note:  Please report to patient. The recent labs are stable. Continue same medication and careful diet. Thyroid dose is correct now. Stay on same dose. ______

## 2012-11-11 NOTE — Progress Notes (Signed)
[name] Date of Birth:  Jul 29, 1926 [facility] [address] [place], [place]  [address] [phone]         Fax   [phone]  History of Present Illness: This pleasant 79 year old woman is seen for a scheduled 3 month followup office visit. She has a history of known ischemic heart disease. She had coronary artery bypass graft surgery in 1989.  She had redo CABG in October 2000. He is a past history of paroxysmal atrial fibrillation.  She is not on Coumadin because of a fall risk. She is on aspirin and Plavix because of her ischemic heart disease. She has a history of hypercholesterolemia, anxiety, insomnia, and she is legally blind. Her most recent echocardiogram 01/20/12 showed an ejection fraction of 45-50%. She has grade 2 diastolic dysfunction. 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 Her amiodarone was stopped four months ago. The patient is now on Synthroid. She has felt better over the past month.  We are rechecking her TSH today.   Current Outpatient Prescriptions  Medication Sig Dispense Refill  . ALPRAZolam (XANAX) 0.25 MG tablet TAKE 1 TABLET BY MOUTH TWICE A DAY AS NEEDED  60 tablet  4  . aspirin EC 81 MG tablet Take 81 mg by mouth daily.      Marland Kitchen atorvastatin (LIPITOR) 20 MG tablet TAKE 1 TABLET BY MOUTH EVERY DAY  90 tablet  3  . clopidogrel (PLAVIX) 75 MG tablet TAKE 1 TABLET BY MOUTH EVERY DAY WITH A MEAL  30 tablet  11  . diltiazem (CARDIZEM CD) 180 MG 24 hr capsule TAKE ONE CAPSULE BY MOUTH EVERY DAY  30 capsule  11  . ferrous gluconate (FERGON) 240 (27 FE) MG tablet Take 240 mg by mouth daily.      . furosemide (LASIX) 40 MG tablet TAKE 1 AND 1/2 TABLETS DAILY  45 tablet  2  . isosorbide mononitrate (IMDUR) 60 MG 24 hr tablet Take 0.5 tablets (30 mg total) by mouth at bedtime.  30 tablet  9  . levalbuterol (XOPENEX HFA) 45 MCG/ACT inhaler Inhale 2 puffs into the lungs 2 (two) times daily.  1 Inhaler     . levothyroxine (SYNTHROID, LEVOTHROID) 50 MCG tablet 1 tablet daily  30 tablet  5  . metoprolol succinate (TOPROL-XL) 25 MG 24 hr tablet Take 1 tablet (25 mg total) by mouth daily. The dose of this medicine was decreased from twice daily to once daily because of your borderline low heart rate.  180 tablet  3  . NITROSTAT 0.4 MG SL tablet TAKE 1 TABLET SUBLINGUALLY AS NEEDED FOR CHEST PAIN  25 tablet  3  . pantoprazole (PROTONIX) 40 MG tablet TAKE 1 TABLET BY MOUTH EVERY DAY  90 tablet  3  . potassium chloride SA (K-DUR,KLOR-CON) 20 MEQ tablet Take 1 tablet (20 mEq total) by mouth daily.  30 tablet  11  . temazepam (RESTORIL) 15 MG capsule Take 1 capsule (15 mg total) by mouth at bedtime as needed. For sleep  30 capsule  5    Allergies  Allergen Reactions  . Amiodarone     Hypothyroidism    Patient Active Problem List  Diagnosis  . Paroxysmal atrial fibrillation  . Ischemic heart disease  . Failed CABG (coronary artery bypass graft)  . Hypercholesterolemia  . Anxiety  . Depression  . Anemia  . Cardiomyopathy, ischemic  . Acute on chronic combined systolic and diastolic congestive heart failure  .  Acute hypokalemia  . Acute bronchitis  . Bradycardia  . Acute renal failure  . Chest pain  . Thrombocytopenia  . Hypothyroidism  . Transaminitis  . Hypothyroidism due to amiodarone  . Dermatitis    History  Smoking status  . Former Smoker  Smokeless tobacco  . Not on file    History  Alcohol Use No    No family history on file.  Review of Systems: Constitutional: no fever chills diaphoresis or fatigue or change in weight.  Head and neck: no hearing loss, no epistaxis, no photophobia or visual disturbance. Respiratory: No cough, shortness of breath or wheezing. Cardiovascular: No chest pain peripheral edema, palpitations. Gastrointestinal: No abdominal distention, no abdominal pain, no change in bowel habits hematochezia or melena. Genitourinary: No dysuria, no  frequency, no urgency, no nocturia. Musculoskeletal:No arthralgias, no back pain, no gait disturbance or myalgias. Neurological: No dizziness, no headaches, no numbness, no seizures, no syncope, no weakness, no tremors. Hematologic: No lymphadenopathy, no easy bruising. Psychiatric: No confusion, no hallucinations, no sleep disturbance.    Physical Exam: Filed Vitals:   11/11/12 0957  BP: 110/52  Pulse: 71   the general appearance reveals a thin elderly woman in no distress.The head and neck exam reveals pupils equal and reactive.  Extraocular movements are full.  There is no scleral icterus.  The mouth and pharynx are normal.  The neck is supple.  The carotids reveal no bruits.  The jugular venous pressure is normal.  The  thyroid is not enlarged.  There is no lymphadenopathy.  The chest is clear to percussion and auscultation.  There are no rales or rhonchi.  Expansion of the chest is symmetrical.  The precordium is quiet.  The first heart sound is normal.  The second heart sound is physiologically split.  There is no murmur gallop rub or click.  There is no abnormal lift or heave.  The abdomen is soft and nontender.  The bowel sounds are normal.  The liver and spleen are not enlarged.  There are no abdominal masses.  There are no abdominal bruits.  Extremities reveal good pedal pulses.  There is no phlebitis or edema.  There is no cyanosis or clubbing.  Strength is normal and symmetrical in all extremities.  There is no lateralizing weakness.  There are no sensory deficits.  The skin is warm and dry.  There is no rash.  The skin is somewhat dry  EKG shows normal sinus rhythm with nonspecific ST-T wave changes unchanged since 07/06/12.  Assessment / Plan: From the cardiac standpoint she appears to be stable.  No recurrent angina.  Continue same medication and await today's lab work to see if we need to adjust her Synthroid further Recheck in 4 months for followup office visit lipid panel hepatic  function panel basal metabolic panel and TSH

## 2012-11-11 NOTE — Patient Instructions (Addendum)
Will obtain labs today and call you with the results (cbc/lp/bmet/hfp/tsh/ft4)  Your physician recommends that you continue on your current medications as directed. Please refer to the Current Medication list given to you today.  Your physician wants you to follow-up in: 4 months with fasting labs (lp/bmet/hfp)  You will receive a reminder letter in the mail two months in advance. If you don't receive a letter, please call our office to schedule the follow-up appointment.

## 2012-11-11 NOTE — Assessment & Plan Note (Signed)
The patient has had no recurrence of her atrial fibrillation that she has been aware of.  She is no longer on warfarin

## 2012-11-11 NOTE — Assessment & Plan Note (Signed)
The patient has a history of hypercholesterolemia and is on Lipitor 20 mg daily.  No myalgias.

## 2012-11-14 ENCOUNTER — Other Ambulatory Visit: Payer: Self-pay

## 2012-11-14 MED ORDER — FUROSEMIDE 40 MG PO TABS: ORAL_TABLET | ORAL | Status: DC

## 2012-11-14 MED ORDER — NITROGLYCERIN 0.4 MG SL SUBL: 0.4000 mg | SUBLINGUAL_TABLET | SUBLINGUAL | Status: DC | PRN

## 2012-11-14 NOTE — Telephone Encounter (Signed)
Message copied by Burnell Blanks on Mon Nov 14, 2012  6:00 PM ------      Message from: Cassell Clement      Created: Fri Nov 11, 2012  9:15 PM       Please report to patient.  The recent labs are stable. Continue same medication and careful diet. Thyroid dose is correct now.  Stay on same dose.

## 2012-11-14 NOTE — Telephone Encounter (Signed)
Advised patient of lab results  

## 2012-12-11 ENCOUNTER — Other Ambulatory Visit (HOSPITAL_COMMUNITY): Payer: Self-pay | Admitting: Physician Assistant

## 2012-12-12 ENCOUNTER — Other Ambulatory Visit: Payer: Self-pay | Admitting: *Deleted

## 2012-12-12 DIAGNOSIS — G4700 Insomnia, unspecified: Secondary | ICD-10-CM

## 2012-12-12 MED ORDER — TEMAZEPAM 15 MG PO CAPS: 15.0000 mg | ORAL_CAPSULE | Freq: Every evening | ORAL | Status: DC | PRN

## 2012-12-12 MED ORDER — FUROSEMIDE 40 MG PO TABS: ORAL_TABLET | ORAL | Status: DC

## 2012-12-12 NOTE — Telephone Encounter (Signed)
Will call to pharmacy.

## 2012-12-12 NOTE — Telephone Encounter (Signed)
Pt needs temazepam (RESTORIL) refilled. Will route this to Dr. Patty Sermons nurse  [phone] pts niece number

## 2012-12-14 ENCOUNTER — Other Ambulatory Visit: Payer: Self-pay | Admitting: *Deleted

## 2012-12-14 MED ORDER — FUROSEMIDE 40 MG PO TABS: ORAL_TABLET | ORAL | Status: DC

## 2013-01-09 ENCOUNTER — Other Ambulatory Visit: Payer: Self-pay | Admitting: Cardiology

## 2013-01-09 ENCOUNTER — Telehealth: Payer: Self-pay | Admitting: Cardiology

## 2013-01-09 DIAGNOSIS — F419 Anxiety disorder, unspecified: Secondary | ICD-10-CM

## 2013-01-09 MED ORDER — ALPRAZOLAM 0.25 MG PO TABS: 0.2500 mg | ORAL_TABLET | Freq: Two times a day (BID) | ORAL | Status: DC | PRN

## 2013-01-09 NOTE — Telephone Encounter (Signed)
Pt needs refill so her niece can take meds to her tomorrow

## 2013-01-09 NOTE — Telephone Encounter (Signed)
Patient needs taken to her home, she is not in a nursing home per niece.

## 2013-01-09 NOTE — Telephone Encounter (Signed)
Refill sent to pharmacy as requested

## 2013-01-09 NOTE — Telephone Encounter (Signed)
Pt out of alprazolam and pt's dtr needs to have it called in asap this am so she can pickk it up and take it to the nursing home, [facility] [address] in [place]

## 2013-02-07 ENCOUNTER — Other Ambulatory Visit: Payer: Self-pay | Admitting: *Deleted

## 2013-02-07 MED ORDER — PANTOPRAZOLE SODIUM 40 MG PO TBEC: 40.0000 mg | DELAYED_RELEASE_TABLET | Freq: Every day | ORAL | Status: DC

## 2013-02-08 ENCOUNTER — Other Ambulatory Visit: Payer: Self-pay | Admitting: *Deleted

## 2013-02-08 DIAGNOSIS — E039 Hypothyroidism, unspecified: Secondary | ICD-10-CM

## 2013-02-08 MED ORDER — LEVOTHYROXINE SODIUM 50 MCG PO TABS: ORAL_TABLET | ORAL | Status: DC

## 2013-03-07 ENCOUNTER — Telehealth: Payer: Self-pay

## 2013-03-07 MED ORDER — NITROGLYCERIN 0.4 MG SL SUBL: 0.4000 mg | SUBLINGUAL_TABLET | SUBLINGUAL | Status: DC | PRN

## 2013-03-07 NOTE — Telephone Encounter (Signed)
pt niece called to rqst refill for nitrostat. refiil completed. pt niece aware.

## 2013-03-13 ENCOUNTER — Ambulatory Visit: Payer: Medicare Other | Admitting: Cardiology

## 2013-04-02 DIAGNOSIS — Z0000 Encounter for general adult medical examination without abnormal findings: Secondary | ICD-10-CM

## 2013-04-05 ENCOUNTER — Encounter: Payer: Self-pay | Admitting: Cardiology

## 2013-04-05 DIAGNOSIS — I214 Non-ST elevation (NSTEMI) myocardial infarction: Secondary | ICD-10-CM

## 2013-04-05 DIAGNOSIS — R079 Chest pain, unspecified: Secondary | ICD-10-CM

## 2013-04-06 ENCOUNTER — Telehealth: Payer: Self-pay

## 2013-04-06 DIAGNOSIS — I214 Non-ST elevation (NSTEMI) myocardial infarction: Secondary | ICD-10-CM

## 2013-04-06 MED ORDER — POTASSIUM CHLORIDE CRYS ER 20 MEQ PO TBCR: 20.0000 meq | EXTENDED_RELEASE_TABLET | Freq: Every day | ORAL | Status: DC

## 2013-04-06 NOTE — Telephone Encounter (Signed)
Pt niece called in regarding Potassium refill that needs to be sent into [facility] 30 day supply. Niece stated that the pharmacy has contacted [place] through fax already but has not heard back. [facility] refill.

## 2013-04-13 ENCOUNTER — Other Ambulatory Visit (HOSPITAL_COMMUNITY): Payer: Self-pay | Admitting: Physician Assistant

## 2013-04-17 ENCOUNTER — Telehealth: Payer: Self-pay | Admitting: Cardiology

## 2013-04-17 NOTE — Telephone Encounter (Signed)
Returned call to Denzil Magnuson at [facility] no answer left message on personal voice mail will let Dr.Brackbill know.

## 2013-04-17 NOTE — Telephone Encounter (Signed)
New problem   [facility] Physical Therapist has started pt today and is doing great will come out 2x wkly for 2wks.

## 2013-04-19 MED ORDER — DOXYCYCLINE HYCLATE 100 MG PO TABS: 100.0000 mg | ORAL_TABLET | Freq: Two times a day (BID) | ORAL | Status: DC

## 2013-04-19 NOTE — Telephone Encounter (Signed)
Spoke with Ashok Cordia, home health nurse. She found a tick and pulled tick off. She states BP 102/60 (this is about normal for her) P 72 T 97.1. Pt feels weak and tired.

## 2013-04-19 NOTE — Telephone Encounter (Signed)
New Prob    Home nurse states she found a tick under pts right breast. States it was buried very deep but she was able to get it out in tact. Pt states the area hurts, it is swollen, and reddish,. Pt states she has  Been dizzy and weak lately. Nurse also believes she may be suffering from some depression. Pt does not have a PCP. Please call.

## 2013-04-19 NOTE — Telephone Encounter (Signed)
Reviewed with Dr Patty Sermons -he will prescribe doxycycline 100 mg bid for10 days. Pt and Marissa aware.

## 2013-04-26 ENCOUNTER — Encounter (HOSPITAL_COMMUNITY): Payer: Self-pay

## 2013-04-26 ENCOUNTER — Emergency Department (HOSPITAL_COMMUNITY)
Admission: EM | Admit: 2013-04-26 | Discharge: 2013-04-26 | Disposition: A | Discharge status: Home / Self Care | Payer: Medicare Other | Attending: Emergency Medicine | Admitting: Emergency Medicine

## 2013-04-26 ENCOUNTER — Other Ambulatory Visit: Payer: Self-pay

## 2013-04-26 ENCOUNTER — Emergency Department (HOSPITAL_COMMUNITY): Payer: Medicare Other

## 2013-04-26 DIAGNOSIS — Z90710 Acquired absence of both cervix and uterus: Secondary | ICD-10-CM | POA: Insufficient documentation

## 2013-04-26 DIAGNOSIS — Z8673 Personal history of transient ischemic attack (TIA), and cerebral infarction without residual deficits: Secondary | ICD-10-CM | POA: Insufficient documentation

## 2013-04-26 DIAGNOSIS — Z87891 Personal history of nicotine dependence: Secondary | ICD-10-CM | POA: Insufficient documentation

## 2013-04-26 DIAGNOSIS — Z79899 Other long term (current) drug therapy: Secondary | ICD-10-CM | POA: Insufficient documentation

## 2013-04-26 DIAGNOSIS — D649 Anemia, unspecified: Secondary | ICD-10-CM | POA: Insufficient documentation

## 2013-04-26 DIAGNOSIS — Z951 Presence of aortocoronary bypass graft: Secondary | ICD-10-CM | POA: Insufficient documentation

## 2013-04-26 DIAGNOSIS — R109 Unspecified abdominal pain: Secondary | ICD-10-CM

## 2013-04-26 DIAGNOSIS — F329 Major depressive disorder, single episode, unspecified: Secondary | ICD-10-CM | POA: Insufficient documentation

## 2013-04-26 DIAGNOSIS — I509 Heart failure, unspecified: Secondary | ICD-10-CM | POA: Insufficient documentation

## 2013-04-26 DIAGNOSIS — F3289 Other specified depressive episodes: Secondary | ICD-10-CM | POA: Insufficient documentation

## 2013-04-26 DIAGNOSIS — Z8669 Personal history of other diseases of the nervous system and sense organs: Secondary | ICD-10-CM | POA: Insufficient documentation

## 2013-04-26 DIAGNOSIS — I5042 Chronic combined systolic (congestive) and diastolic (congestive) heart failure: Secondary | ICD-10-CM | POA: Insufficient documentation

## 2013-04-26 DIAGNOSIS — Z8679 Personal history of other diseases of the circulatory system: Secondary | ICD-10-CM | POA: Insufficient documentation

## 2013-04-26 DIAGNOSIS — R0789 Other chest pain: Secondary | ICD-10-CM

## 2013-04-26 DIAGNOSIS — I10 Essential (primary) hypertension: Secondary | ICD-10-CM | POA: Insufficient documentation

## 2013-04-26 DIAGNOSIS — Z862 Personal history of diseases of the blood and blood-forming organs and certain disorders involving the immune mechanism: Secondary | ICD-10-CM | POA: Insufficient documentation

## 2013-04-26 DIAGNOSIS — E039 Hypothyroidism, unspecified: Secondary | ICD-10-CM | POA: Insufficient documentation

## 2013-04-26 DIAGNOSIS — I2510 Atherosclerotic heart disease of native coronary artery without angina pectoris: Secondary | ICD-10-CM | POA: Insufficient documentation

## 2013-04-26 DIAGNOSIS — Z7982 Long term (current) use of aspirin: Secondary | ICD-10-CM | POA: Insufficient documentation

## 2013-04-26 DIAGNOSIS — F411 Generalized anxiety disorder: Secondary | ICD-10-CM | POA: Insufficient documentation

## 2013-04-26 DIAGNOSIS — Z8639 Personal history of other endocrine, nutritional and metabolic disease: Secondary | ICD-10-CM | POA: Insufficient documentation

## 2013-04-26 LAB — URINE MICROSCOPIC-ADD ON

## 2013-04-26 LAB — COMPREHENSIVE METABOLIC PANEL
Albumin: 3.3 g/dL — ABNORMAL LOW (ref 3.5–5.2)
BUN: 18 mg/dL (ref 6–23)
Chloride: 101 mEq/L (ref 96–112)
Creatinine, Ser: 0.92 mg/dL (ref 0.50–1.10)
Total Bilirubin: 0.7 mg/dL (ref 0.3–1.2)

## 2013-04-26 LAB — URINALYSIS, ROUTINE W REFLEX MICROSCOPIC
Bilirubin Urine: NEGATIVE
Ketones, ur: NEGATIVE mg/dL
Nitrite: NEGATIVE
Protein, ur: NEGATIVE mg/dL
Specific Gravity, Urine: 1.008 (ref 1.005–1.030)
Urobilinogen, UA: 0.2 mg/dL (ref 0.0–1.0)

## 2013-04-26 LAB — CBC WITH DIFFERENTIAL/PLATELET
Basophils Relative: 0 % (ref 0–1)
Eosinophils Absolute: 0.2 10*3/uL (ref 0.0–0.7)
HCT: 35.7 % — ABNORMAL LOW (ref 36.0–46.0)
Hemoglobin: 12 g/dL (ref 12.0–15.0)
MCH: 29.1 pg (ref 26.0–34.0)
MCHC: 33.6 g/dL (ref 30.0–36.0)
MCV: 86.4 fL (ref 78.0–100.0)
Monocytes Absolute: 0.3 10*3/uL (ref 0.1–1.0)
Monocytes Relative: 6 % (ref 3–12)

## 2013-04-26 LAB — LIPASE, BLOOD: Lipase: 32 U/L (ref 11–59)

## 2013-04-26 LAB — TROPONIN I: Troponin I: <0.3 ng/mL (ref <0.30)

## 2013-04-26 MED ORDER — ONDANSETRON HCL 4 MG/2ML IJ SOLN
4.0000 mg | Freq: Once | INTRAMUSCULAR | Status: AC
  Administered 2013-04-26: 4 mg via INTRAVENOUS
  Filled 2013-04-26: qty 2

## 2013-04-26 NOTE — ED Notes (Signed)
PER EMS- pt was at home when her CP started, described as dull that progressed to sharp, pt also reports abd pain, nausea. Hx of multiple MI's and heart surgeries. VS: 123/81, HR-77, O2-100RA.

## 2013-04-26 NOTE — ED Notes (Signed)
Placed call to [facility] for transportation back home 

## 2013-04-26 NOTE — ED Notes (Signed)
Consulted lab regarding blood draw. Stated this patient is next.

## 2013-04-26 NOTE — ED Notes (Signed)
[facility] has been called for patient. Pt given [place] sandwich and apple juice while waiting for transport.

## 2013-04-26 NOTE — ED Provider Notes (Signed)
History    CSN: [id number] Arrival date & time 04/26/13  1058  First MD Initiated Contact with Patient 04/26/13 1105     Chief Complaint  Patient presents with  . Chest Pain   (Consider location/radiation/quality/duration/timing/severity/associated sxs/prior Treatment) HPI 79 yo female presents to the emergency room via EMS with complaint of abdominal and chest pain.  Patient reports she woke this morning and had chicken salad sandwich, along with coffee and orange juice.  About an hour after eating, she reports she had waves of pain starting below her belly button up into her chest.  Patient was nauseated, but had no vomiting.  Patient reports that she thinks she had a light heart attack.  Patient reports all symptoms have since resolved.  Past Medical History  Diagnosis Date  . Stroke   . Coronary artery disease     remote CABG in 1989 and redo in 2007; has residual stable angina  . Hypertension   . Hyperlipidemia   . PAF (paroxysmal atrial fibrillation)   . High risk medication use     on amiodarone  . Systolic and diastolic CHF, chronic     EF 45% per echo in 2012  . Anemia   . Depression   . Anxiety   . Macular degeneration   . Chronic anxiety   . Hypothyroidism 05/29/2012   Past Surgical History  Procedure Laterality Date  . Coronary artery bypass graft      1989 with redo 2007  . Abdominal hysterectomy    . Cardiac surgery     No family history on file. History  Substance Use Topics  . Smoking status: Former Games developer  . Smokeless tobacco: Not on file  . Alcohol Use: No   OB History   Grav Para Term Preterm Abortions TAB SAB Ect Mult Living                 Review of Systems  All other systems reviewed and are negative.    Allergies  Amiodarone  Home Medications   Current Outpatient Rx  Name  Route  Sig  Dispense  Refill  . ALPRAZolam (XANAX) 0.25 MG tablet   Oral   Take 0.25 mg by mouth 2 (two) times daily as needed for anxiety.         Marland Kitchen  aspirin EC 81 MG tablet   Oral   Take 81 mg by mouth daily.         Marland Kitchen atorvastatin (LIPITOR) 20 MG tablet      TAKE 1 TABLET BY MOUTH EVERY DAY   90 tablet   3   . clopidogrel (PLAVIX) 75 MG tablet      TAKE 1 TABLET BY MOUTH EVERY DAY WITH A MEAL   30 tablet   11   . diltiazem (CARDIZEM CD) 180 MG 24 hr capsule      TAKE ONE CAPSULE BY MOUTH EVERY DAY   30 capsule   11   . doxycycline (VIBRA-TABS) 100 MG tablet   Oral   Take 1 tablet (100 mg total) by mouth 2 (two) times daily. Take for 10 days   20 tablet   0   . ferrous gluconate (FERGON) 240 (27 FE) MG tablet   Oral   Take 240 mg by mouth daily.         Marland Kitchen levalbuterol (XOPENEX HFA) 45 MCG/ACT inhaler   Inhalation   Inhale 2 puffs into the lungs 2 (two) times daily.   1 Inhaler      .  levothyroxine (SYNTHROID, LEVOTHROID) 50 MCG tablet      1 tablet daily   30 tablet   5   . metoprolol succinate (TOPROL-XL) 25 MG 24 hr tablet   Oral   Take 1 tablet (25 mg total) by mouth daily. The dose of this medicine was decreased from twice daily to once daily because of your borderline low heart rate.   180 tablet   3   . nitroGLYCERIN (NITROSTAT) 0.4 MG SL tablet   Sublingual   Place 1 tablet (0.4 mg total) under the tongue every 5 (five) minutes as needed for chest pain.   25 tablet   3   . furosemide (LASIX) 40 MG tablet      Take 1 and 1/2 tablet tablets daily.   45 tablet   4   . isosorbide mononitrate (IMDUR) 60 MG 24 hr tablet   Oral   Take 0.5 tablets (30 mg total) by mouth at bedtime.   30 tablet   9   . pantoprazole (PROTONIX) 40 MG tablet   Oral   Take 1 tablet (40 mg total) by mouth daily.   90 tablet   3   . potassium chloride SA (KLOR-CON M20) 20 MEQ tablet   Oral   Take 1 tablet (20 mEq total) by mouth daily.   30 tablet   1   . temazepam (RESTORIL) 15 MG capsule   Oral   Take 1 capsule (15 mg total) by mouth at bedtime as needed. For sleep   30 capsule   5    BP 112/84   Pulse 71  Temp(Src) 98.3 F (36.8 C) (Oral)  Resp 23  Ht 5\' 3"  (1.6 m)  Wt 105 lb (47.628 kg)  BMI 18.6 kg/m2  SpO2 95% Physical Exam  Nursing note and vitals reviewed. Constitutional: She is oriented to person, place, and time. She appears well-developed and well-nourished.  HENT:  Head: Normocephalic and atraumatic.  Nose: Nose normal.  Mouth/Throat: Oropharynx is clear and moist.  Eyes: Conjunctivae and EOM are normal. Pupils are equal, round, and reactive to light.  Neck: Normal range of motion. Neck supple. No JVD present. No tracheal deviation present. No thyromegaly present.  Cardiovascular: Normal rate, regular rhythm, normal heart sounds and intact distal pulses.  Exam reveals no gallop and no friction rub.   No murmur heard. Pulmonary/Chest: Effort normal and breath sounds normal. No stridor. No respiratory distress. She has no wheezes. She has no rales. She exhibits no tenderness.  Abdominal: Soft. Bowel sounds are normal. She exhibits no distension and no mass. There is no tenderness. There is no rebound and no guarding.  Musculoskeletal: Normal range of motion. She exhibits no edema and no tenderness.  Lymphadenopathy:    She has no cervical adenopathy.  Neurological: She is alert and oriented to person, place, and time. She exhibits normal muscle tone. Coordination normal.  Skin: Skin is warm and dry. No rash noted. No erythema. No pallor.  Psychiatric: She has a normal mood and affect. Her behavior is normal. Judgment and thought content normal.    ED Course  Procedures (including critical care time) Labs Reviewed  CBC WITH DIFFERENTIAL - Abnormal; Notable for the following:    HCT 35.7 (*)    Platelets 145 (*)    All other components within normal limits  COMPREHENSIVE METABOLIC PANEL - Abnormal; Notable for the following:    Potassium 3.2 (*)    CO2 33 (*)    Albumin 3.3 (*)  GFR calc non Af Amer 55 (*)    GFR calc Af Amer 64 (*)    All other components  within normal limits  URINALYSIS, ROUTINE W REFLEX MICROSCOPIC - Abnormal; Notable for the following:    Leukocytes, UA TRACE (*)    All other components within normal limits  LIPASE, BLOOD  TROPONIN I  URINE MICROSCOPIC-ADD ON   Dg Chest 2 View  04/26/2013   *RADIOLOGY REPORT*  Clinical Data: Chest pain and nausea  CHEST - 2 VIEW  Comparison: 05/28/2012  Findings: The lungs are clear without focal infiltrate, edema, pneumothorax or pleural effusion. Interstitial markings are diffusely coarsened with chronic features. The cardiopericardial silhouette is within normal limits for size.  The patient is status post CABG. Imaged bony structures of the thorax are intact. Surgical clips in the right upper quadrant suggest prior cholecystectomy.  IMPRESSION: No acute cardiopulmonary process.   Original Report Authenticated By: Kennith Center, M.D.    Date: 04/26/2013  Rate:71  Rhythm: normal sinus rhythm  QRS Axis: normal  Intervals: normal  ST/T Wave abnormalities: normal  Conduction Disutrbances:none  Narrative Interpretation:   Old EKG Reviewed: unchanged   1. Abdominal pain   2. Atypical chest pain     MDM  79 year old female with chest and abdominal pain.  She does have history of coronary disease, but EKG does not show any acute abnormalities.  Pain started in abdomen and then radiated upward.  Differential includes biliary colic, dyspepsia, pancreatitis.  Patient is completely asymptomatic at this time on my evaluation but she does report to nursing staff that she is having dysuria.  Will send urine as well as baseline labs.  Olivia Mackie, MD 04/26/13 1351

## 2013-05-01 ENCOUNTER — Other Ambulatory Visit: Payer: Self-pay | Admitting: *Deleted

## 2013-05-01 DIAGNOSIS — E039 Hypothyroidism, unspecified: Secondary | ICD-10-CM

## 2013-05-01 MED ORDER — POTASSIUM CHLORIDE CRYS ER 20 MEQ PO TBCR: 20.0000 meq | EXTENDED_RELEASE_TABLET | Freq: Every day | ORAL | Status: DC

## 2013-05-01 MED ORDER — FUROSEMIDE 40 MG PO TABS: ORAL_TABLET | ORAL | Status: DC

## 2013-05-01 MED ORDER — LEVOTHYROXINE SODIUM 50 MCG PO TABS: ORAL_TABLET | ORAL | Status: DC

## 2013-05-01 MED ORDER — CLOPIDOGREL BISULFATE 75 MG PO TABS: 75.0000 mg | ORAL_TABLET | Freq: Every day | ORAL | Status: DC

## 2013-05-02 ENCOUNTER — Other Ambulatory Visit: Payer: Self-pay | Admitting: *Deleted

## 2013-05-02 MED ORDER — DILTIAZEM HCL ER COATED BEADS 180 MG PO CP24: 180.0000 mg | ORAL_CAPSULE | Freq: Every day | ORAL | Status: DC

## 2013-05-03 ENCOUNTER — Encounter: Payer: Self-pay | Admitting: Cardiology

## 2013-05-03 ENCOUNTER — Ambulatory Visit (INDEPENDENT_AMBULATORY_CARE_PROVIDER_SITE_OTHER): Payer: Medicare Other | Admitting: Cardiology

## 2013-05-03 VITALS — BP 122/64 | HR 68 | Ht 63.0 in | Wt 106.0 lb

## 2013-05-03 DIAGNOSIS — I480 Paroxysmal atrial fibrillation: Secondary | ICD-10-CM

## 2013-05-03 DIAGNOSIS — E039 Hypothyroidism, unspecified: Secondary | ICD-10-CM

## 2013-05-03 DIAGNOSIS — I259 Chronic ischemic heart disease, unspecified: Secondary | ICD-10-CM

## 2013-05-03 DIAGNOSIS — I4891 Unspecified atrial fibrillation: Secondary | ICD-10-CM

## 2013-05-03 DIAGNOSIS — E7800 Pure hypercholesterolemia, unspecified: Secondary | ICD-10-CM

## 2013-05-03 NOTE — Assessment & Plan Note (Signed)
The patient has not had any recurrent atrial fibrillation. 

## 2013-05-03 NOTE — Assessment & Plan Note (Signed)
The patient has not had any recurrent angina pectoris. 

## 2013-05-03 NOTE — Assessment & Plan Note (Signed)
She is on Lipitor 20 mg hypercholesterolemia.  We will plan to recheck fasting lab work at her next visit

## 2013-05-03 NOTE — Progress Notes (Signed)
[name] Date of Birth:  [birth year] [facility] [address] [place], [place]  [address] [phone]         Fax   [phone]  History of Present Illness: This pleasant 79 year old woman is seen for a  followup office visit. She has a history of known ischemic heart disease. She had coronary artery bypass graft surgery in 1989. She had redo CABG in October 2000. He is a past history of paroxysmal atrial fibrillation. She is not on Coumadin because of a fall risk. She is on aspirin and Plavix because of her ischemic heart disease. She has a history of hypercholesterolemia, anxiety, insomnia, and she is legally blind. Her most recent echocardiogram 01/20/12 done in [place] showed an ejection fraction of 45-50%. She has grade 2 diastolic dysfunction.  She had a more recent echocardiogram on 04/05/13 in [facility] showing an ejection fraction 50% with mild to moderate mitral regurgitation, mild aortic insufficiency, no aortic stenosis, and pulmonary artery pressure of 50. 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 Her amiodarone was stopped four months ago. The patient is now on Synthroid. She has felt better over the past month.  The patient was recently seen in the emergency room with chest and epigastric pain and ruled out for an MI.   Current Outpatient Prescriptions  Medication Sig Dispense Refill  . ALPRAZolam (XANAX) 0.25 MG tablet Take 0.25 mg by mouth 2 (two) times daily as needed for anxiety.      Marland Kitchen aspirin EC 81 MG tablet Take 81 mg by mouth daily.      Marland Kitchen atorvastatin (LIPITOR) 20 MG tablet TAKE 1 TABLET BY MOUTH EVERY DAY  90 tablet  3  . clopidogrel (PLAVIX) 75 MG tablet Take 1 tablet (75 mg total) by mouth daily.  90 tablet  0  . diltiazem (CARDIZEM CD) 180 MG 24 hr capsule Take 1 capsule (180 mg total) by mouth daily.  90 capsule  1  . docusate sodium (COLACE) 50 MG capsule Take 50 mg by mouth as  directed. 2 daily      . ferrous gluconate (FERGON) 240 (27 FE) MG tablet Take 240 mg by mouth daily.      . furosemide (LASIX) 40 MG tablet Take 1 and 1/2 tablet tablets daily.  135 tablet  1  . isosorbide mononitrate (IMDUR) 60 MG 24 hr tablet Take 0.5 tablets (30 mg total) by mouth at bedtime.  30 tablet  9  . levalbuterol (XOPENEX HFA) 45 MCG/ACT inhaler Inhale 2 puffs into the lungs 2 (two) times daily.  1 Inhaler    . levothyroxine (SYNTHROID, LEVOTHROID) 50 MCG tablet 1 tablet daily  90 tablet  1  . metoprolol succinate (TOPROL-XL) 25 MG 24 hr tablet Take 1 tablet (25 mg total) by mouth daily. The dose of this medicine was decreased from twice daily to once daily because of your borderline low heart rate.  180 tablet  3  . nitroGLYCERIN (NITROSTAT) 0.4 MG SL tablet Place 1 tablet (0.4 mg total) under the tongue every 5 (five) minutes as needed for chest pain.  25 tablet  3  . pantoprazole (PROTONIX) 40 MG tablet Take 1 tablet (40 mg total) by mouth daily.  90 tablet  3  . potassium chloride SA (KLOR-CON M20) 20 MEQ tablet Take 1 tablet (20 mEq total) by mouth daily.  90 tablet  1  . temazepam (RESTORIL) 15 MG capsule Take 1 capsule (  15 mg total) by mouth at bedtime as needed. For sleep  30 capsule  5   No current facility-administered medications for this visit.    Allergies  Allergen Reactions  . Amiodarone     Hypothyroidism    Patient Active Problem List   Diagnosis Date Noted  . Paroxysmal atrial fibrillation 02/02/2011    Priority: Medium  . Dermatitis 07/06/2012  . 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 Hypothyroidism 05/29/2012  . Transaminitis 05/29/2012  . Acute bronchitis 05/28/2012  . Bradycardia 05/28/2012  . Acute renal failure 05/28/2012  . Chest pain 05/28/2012  . Thrombocytopenia 05/28/2012  . Cardiomyopathy, ischemic 12/18/2011  . Acute on chronic combined systolic and diastolic congestive heart failure 12/18/2011  . Acute hypokalemia 12/18/2011  .  Ischemic heart disease 02/02/2011  . Failed CABG (coronary artery bypass graft) 02/02/2011  . Hypercholesterolemia 02/02/2011  . Anxiety 02/02/2011  . Depression 02/02/2011  . Anemia 02/02/2011    History  Smoking status  . Former Smoker  Smokeless tobacco  . Not on file    History  Alcohol Use No    No family history on file.  Review of Systems: Constitutional: no fever chills diaphoresis or fatigue or change in weight.  Head and neck: no hearing loss, no epistaxis, no photophobia or visual disturbance. Respiratory: No cough, shortness of breath or wheezing. Cardiovascular: No chest pain peripheral edema, palpitations. Gastrointestinal: No abdominal distention, no abdominal pain, no change in bowel habits hematochezia or melena. Genitourinary: No dysuria, no frequency, no urgency, no nocturia. Musculoskeletal:No arthralgias, no back pain, no gait disturbance or myalgias. Neurological: No dizziness, no headaches, no numbness, no seizures, no syncope, no weakness, no tremors. Hematologic: No lymphadenopathy, no easy bruising. Psychiatric: No confusion, no hallucinations, no sleep disturbance.    Physical Exam: Filed Vitals:   05/03/13 1159  BP: 122/64  Pulse: 68   the general appearance reveals a well-developed thin elderly woman in no distress.The head and neck exam reveals pupils equal and reactive.  Extraocular movements are full.  There is no scleral icterus.  The mouth and pharynx are normal.  The neck is supple.  The carotids reveal no bruits.  The jugular venous pressure is normal.  The  thyroid is not enlarged.  There is no lymphadenopathy.  The chest is clear to percussion and auscultation.  There are no rales or rhonchi.  Expansion of the chest is symmetrical.  The precordium is quiet.  The first heart sound is normal.  The second heart sound is physiologically split.  There is a grade 3/6 systolic murmur at the left lower sternal edge.  There is no abnormal lift or  heave.  The abdomen is soft and nontender.  The bowel sounds are normal.  The liver and spleen are not enlarged.  There are no abdominal masses.  There are no abdominal bruits.  Extremities reveal good pedal pulses.  There is no phlebitis or edema.  There is no cyanosis or clubbing.  Strength is normal and symmetrical in all extremities.  There is no lateralizing weakness.  There are no sensory deficits.  The skin is warm and dry.  There is no rash.     Assessment / Plan: Continue on same medication.  Stool softener daily because of complaints of constipation. Recheck in 3 months for office visit EKG CBC TSH basal metabolic panel hepatic function panel and fasting lipid panel

## 2013-05-03 NOTE — Patient Instructions (Addendum)
Add stool softner (colace) 2 daily   Keep other medications as you are currently taking  Your physician wants you to follow-up in: 3 months with fasting labs (LP/BMET/HFP/cbc/tsh)  You will receive a reminder letter in the mail two months in advance. If you don't receive a letter, please call our office to schedule the follow-up appointment.

## 2013-05-29 ENCOUNTER — Other Ambulatory Visit: Payer: Self-pay | Admitting: Cardiology

## 2013-05-29 ENCOUNTER — Other Ambulatory Visit: Payer: Self-pay | Admitting: Cardiovascular Disease

## 2013-05-29 DIAGNOSIS — G4700 Insomnia, unspecified: Secondary | ICD-10-CM

## 2013-05-30 NOTE — Telephone Encounter (Signed)
I saw this one routed to you previously, so i thought i should send it to you this time as well. Thanks

## 2013-06-26 ENCOUNTER — Other Ambulatory Visit: Payer: Self-pay | Admitting: Cardiology

## 2013-06-28 ENCOUNTER — Other Ambulatory Visit: Payer: Self-pay

## 2013-06-28 MED ORDER — ALPRAZOLAM 0.25 MG PO TABS: 0.2500 mg | ORAL_TABLET | Freq: Two times a day (BID) | ORAL | Status: DC | PRN

## 2013-06-29 ENCOUNTER — Telehealth: Payer: Self-pay | Admitting: *Deleted

## 2013-06-29 NOTE — Telephone Encounter (Signed)
Pt needs Xanax filled will route this to his nurse please contact her niece in regards to this if any questions would like a call back  [phone]

## 2013-06-30 ENCOUNTER — Other Ambulatory Visit: Payer: Self-pay

## 2013-06-30 ENCOUNTER — Other Ambulatory Visit: Payer: Self-pay | Admitting: Cardiology

## 2013-06-30 MED ORDER — ALPRAZOLAM 0.25 MG PO TABS: 0.2500 mg | ORAL_TABLET | Freq: Two times a day (BID) | ORAL | Status: DC | PRN

## 2013-06-30 NOTE — Telephone Encounter (Signed)
Pt's niece calling re xanax rx still not called in, worried about long weekend without it, pls call asap

## 2013-07-04 NOTE — Telephone Encounter (Signed)
Rx called in as requested

## 2013-08-02 ENCOUNTER — Ambulatory Visit: Payer: Medicare Other | Admitting: Cardiology

## 2013-08-06 ENCOUNTER — Other Ambulatory Visit: Payer: Self-pay | Admitting: Cardiology

## 2013-08-18 ENCOUNTER — Ambulatory Visit (INDEPENDENT_AMBULATORY_CARE_PROVIDER_SITE_OTHER): Payer: Medicare Other | Admitting: Cardiology

## 2013-08-18 ENCOUNTER — Ambulatory Visit: Payer: Medicare Other | Admitting: Cardiology

## 2013-08-18 ENCOUNTER — Encounter: Payer: Self-pay | Admitting: Cardiology

## 2013-08-18 VITALS — BP 110/70 | HR 69 | Ht 63.0 in | Wt 101.0 lb

## 2013-08-18 DIAGNOSIS — Z23 Encounter for immunization: Secondary | ICD-10-CM

## 2013-08-18 DIAGNOSIS — Z0000 Encounter for general adult medical examination without abnormal findings: Secondary | ICD-10-CM

## 2013-08-18 DIAGNOSIS — I480 Paroxysmal atrial fibrillation: Secondary | ICD-10-CM

## 2013-08-18 DIAGNOSIS — I4891 Unspecified atrial fibrillation: Secondary | ICD-10-CM

## 2013-08-18 DIAGNOSIS — E039 Hypothyroidism, unspecified: Secondary | ICD-10-CM

## 2013-08-18 DIAGNOSIS — E7800 Pure hypercholesterolemia, unspecified: Secondary | ICD-10-CM

## 2013-08-18 DIAGNOSIS — I259 Chronic ischemic heart disease, unspecified: Secondary | ICD-10-CM

## 2013-08-18 LAB — BASIC METABOLIC PANEL
Calcium: 9.4 mg/dL (ref 8.4–10.5)
Creatinine, Ser: 0.9 mg/dL (ref 0.4–1.2)
GFR: 61.56 mL/min (ref >60.00)

## 2013-08-18 LAB — CBC WITH DIFFERENTIAL/PLATELET
Basophils Relative: 0.8 % (ref 0.0–3.0)
Eosinophils Absolute: 0.2 10*3/uL (ref 0.0–0.7)
Eosinophils Relative: 3.1 % (ref 0.0–5.0)
Hemoglobin: 13.4 g/dL (ref 12.0–15.0)
MCHC: 33.8 g/dL (ref 30.0–36.0)
MCV: 84.4 fl (ref 78.0–100.0)
Monocytes Absolute: 0.6 10*3/uL (ref 0.1–1.0)
Neutro Abs: 5.2 10*3/uL (ref 1.4–7.7)
RBC: 4.71 Mil/uL (ref 3.87–5.11)
WBC: 7.8 10*3/uL (ref 4.5–10.5)

## 2013-08-18 LAB — HEPATIC FUNCTION PANEL
Bilirubin, Direct: 0.1 mg/dL (ref 0.0–0.3)
Total Bilirubin: 1.1 mg/dL (ref 0.3–1.2)

## 2013-08-18 LAB — LIPID PANEL
HDL: 68.6 mg/dL (ref >39.00)
LDL Cholesterol: 70 mg/dL (ref 0–99)
Total CHOL/HDL Ratio: 2
Triglycerides: 120 mg/dL (ref 0.0–149.0)
VLDL: 24 mg/dL (ref 0.0–40.0)

## 2013-08-18 NOTE — Progress Notes (Signed)
Quick Note:  Please report to patient. The recent labs are stable. Continue same medication and careful diet. Thyroid good. ______

## 2013-08-18 NOTE — Assessment & Plan Note (Signed)
The patient has not had any prolonged episodes of angina.  She is relatively sedentary because of her limitations of her vision.

## 2013-08-18 NOTE — Patient Instructions (Signed)
Will obtain labs today and call you with the results (lp/bmet/hfp/tsh/cbc)  Your physician recommends that you continue on your current medications as directed. Please refer to the Current Medication list given to you today.  Your physician wants you to follow-up in: 3 month ov/ekg/bmet You will receive a reminder letter in the mail two months in advance. If you don't receive a letter, please call our office to schedule the follow-up appointment.   OK FOR FLU SHOT TODAY

## 2013-08-18 NOTE — Progress Notes (Signed)
[name] Date of Birth:  May 09, 1926 [address] [place], [place]  [address] [phone]         Fax   [phone]  History of Present Illness: This pleasant 79 year old woman is seen for a  followup office visit. She has a history of known ischemic heart disease. She had coronary artery bypass graft surgery in 1989. She had redo CABG in October 2000. He is a past history of paroxysmal atrial fibrillation. She is not on Coumadin because of a fall risk. She is on aspirin and Plavix because of her ischemic heart disease. She has a history of hypercholesterolemia, anxiety, insomnia, and she is legally blind. Her most recent echocardiogram 01/20/12 done in [place] showed an ejection fraction of 45-50%. She has grade 2 diastolic dysfunction.  She had a more recent echocardiogram on 04/05/13 in [facility] showing an ejection fraction 50% with mild to moderate mitral regurgitation, mild aortic insufficiency, no aortic stenosis, and pulmonary artery pressure of 50. 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 Her amiodarone was stopped four months ago. The patient is now on Synthroid. She has felt better over the past month.  The patient was recently seen in the emergency room with chest and epigastric pain and ruled out for an MI.  Since last visit she has had no new cardiac events.  She has occasional chest pain for which he takes sublingual nitroglycerin.  Current Outpatient Prescriptions  Medication Sig Dispense Refill  . ALPRAZolam (XANAX) 0.25 MG tablet Take 1 tablet (0.25 mg total) by mouth 2 (two) times daily as needed for anxiety.  30 tablet  2  . aspirin EC 81 MG tablet Take 81 mg by mouth daily.      Marland Kitchen atorvastatin (LIPITOR) 20 MG tablet TAKE 1 TABLET BY MOUTH EVERY DAY  90 tablet  3  . clopidogrel (PLAVIX) 75 MG tablet TAKE 1 TABLET BY MOUTH EVERY DAY  90 tablet  0  . diltiazem (CARDIZEM CD) 180 MG 24 hr capsule Take 1  capsule (180 mg total) by mouth daily.  90 capsule  1  . docusate sodium (COLACE) 50 MG capsule Take 50 mg by mouth as directed. 2 daily      . ferrous gluconate (FERGON) 240 (27 FE) MG tablet Take 240 mg by mouth daily.      . furosemide (LASIX) 40 MG tablet Take 1 and 1/2 tablet tablets daily.  135 tablet  1  . isosorbide mononitrate (IMDUR) 60 MG 24 hr tablet Take 0.5 tablets (30 mg total) by mouth at bedtime.  30 tablet  9  . levalbuterol (XOPENEX HFA) 45 MCG/ACT inhaler Inhale 2 puffs into the lungs 2 (two) times daily.  1 Inhaler    . levothyroxine (SYNTHROID, LEVOTHROID) 50 MCG tablet 1 tablet daily  90 tablet  1  . metoprolol succinate (TOPROL-XL) 25 MG 24 hr tablet Take 1 tablet (25 mg total) by mouth daily. The dose of this medicine was decreased from twice daily to once daily because of your borderline low heart rate.  180 tablet  3  . NITROSTAT 0.4 MG SL tablet DISSOLVE 1 TABLET UNDER TONGUE EVERY 5 MINUTES AS DIRECTED AS NEEDED FOR CHEST PAIN  25 tablet  3  . pantoprazole (PROTONIX) 40 MG tablet Take 1 tablet (40 mg total) by mouth daily.  90 tablet  3  . potassium chloride SA (KLOR-CON M20) 20 MEQ tablet Take 1 tablet (20 mEq  total) by mouth daily.  90 tablet  1  . temazepam (RESTORIL) 15 MG capsule TAKE ONE CAPSULE AT BEDTIME AS NEEDED  30 capsule  5   No current facility-administered medications for this visit.    Allergies  Allergen Reactions  . Amiodarone     Hypothyroidism    Patient Active Problem List   Diagnosis Date Noted  . Paroxysmal atrial fibrillation 02/02/2011    Priority: Medium  . Dermatitis 07/06/2012  . 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 Hypothyroidism 05/29/2012  . Transaminitis 05/29/2012  . Acute bronchitis 05/28/2012  . Bradycardia 05/28/2012  . Acute renal failure 05/28/2012  . Chest pain 05/28/2012  . Thrombocytopenia 05/28/2012  . Cardiomyopathy, ischemic 12/18/2011  . Acute on chronic combined systolic and diastolic congestive  heart failure 12/18/2011  . Acute hypokalemia 12/18/2011  . Ischemic heart disease 02/02/2011  . Failed CABG (coronary artery bypass graft) 02/02/2011  . Hypercholesterolemia 02/02/2011  . Anxiety 02/02/2011  . Depression 02/02/2011  . Anemia 02/02/2011    History  Smoking status  . Former Smoker  Smokeless tobacco  . Not on file    History  Alcohol Use No    No family history on file.  Review of Systems: Constitutional: no fever chills diaphoresis or fatigue or change in weight.  Head and neck: no hearing loss, no epistaxis, no photophobia or visual disturbance. Respiratory: No cough, shortness of breath or wheezing. Cardiovascular: No chest pain peripheral edema, palpitations. Gastrointestinal: No abdominal distention, no abdominal pain, no change in bowel habits hematochezia or melena. Genitourinary: No dysuria, no frequency, no urgency, no nocturia. Musculoskeletal:No arthralgias, no back pain, no gait disturbance or myalgias. Neurological: No dizziness, no headaches, no numbness, no seizures, no syncope, no weakness, no tremors. Hematologic: No lymphadenopathy, no easy bruising. Psychiatric: No confusion, no hallucinations, no sleep disturbance.    Physical Exam: Filed Vitals:   08/18/13 1057  BP: 110/70  Pulse: 69   the general appearance reveals a well-developed thin elderly woman in no distress.The head and neck exam reveals pupils equal and reactive.  Extraocular movements are full.  There is no scleral icterus.  The mouth and pharynx are normal.  The neck is supple.  The carotids reveal no bruits.  The jugular venous pressure is normal.  The  thyroid is not enlarged.  There is no lymphadenopathy.  The chest is clear to percussion and auscultation.  There are no rales or rhonchi.  Expansion of the chest is symmetrical.  The precordium is quiet.  The first heart sound is normal.  The second heart sound is physiologically split.  There is a grade 3/6 systolic murmur  at the left lower sternal edge.  There is no abnormal lift or heave.  The abdomen is soft and nontender.  The bowel sounds are normal.  The liver and spleen are not enlarged.  There are no abdominal masses.  There are no abdominal bruits.  Extremities reveal good pedal pulses.  There is no phlebitis or edema.  There is no cyanosis or clubbing.  Strength is normal and symmetrical in all extremities.  There is no lateralizing weakness.  There are no sensory deficits.  The skin is warm and dry.  There is no rash.     Assessment / Plan: Continue on same medication.  Stool softener daily because of complaints of constipation. Recheck in 3 months for office visit EKG AND BASAL METABOLIC PANEL

## 2013-08-18 NOTE — Assessment & Plan Note (Signed)
Patient has a history of hypercholesterolemia.  We are checking labs today

## 2013-08-18 NOTE — Assessment & Plan Note (Signed)
She remains in normal sinus rhythm.  She has not been aware of any palpitations.

## 2013-08-21 ENCOUNTER — Telehealth: Payer: Self-pay | Admitting: *Deleted

## 2013-08-21 NOTE — Telephone Encounter (Signed)
Message copied by Burnell Blanks on Mon Aug 21, 2013  2:17 PM ------      Message from: Cassell Clement      Created: Fri Aug 18, 2013  6:42 PM       Please report to patient.  The recent labs are stable. Continue same medication and careful diet. Thyroid good. ------

## 2013-08-21 NOTE — Telephone Encounter (Signed)
Advised patient of lab results  

## 2013-08-29 ENCOUNTER — Ambulatory Visit: Payer: Medicare Other | Admitting: Cardiology

## 2013-09-05 ENCOUNTER — Other Ambulatory Visit: Payer: Self-pay | Admitting: Cardiology

## 2013-09-06 ENCOUNTER — Other Ambulatory Visit: Payer: Self-pay | Admitting: Cardiology

## 2013-10-03 ENCOUNTER — Telehealth: Payer: Self-pay | Admitting: *Deleted

## 2013-10-03 ENCOUNTER — Other Ambulatory Visit: Payer: Self-pay | Admitting: Cardiology

## 2013-10-03 NOTE — Telephone Encounter (Signed)
Sheree called stating that the patient has been taking an extra protonix daily and wanted to know if Dr Patty Sermons would write her a new rx. Please advise. Thanks, MI

## 2013-10-03 NOTE — Telephone Encounter (Signed)
Okay to increase Protonix to twice a day

## 2013-10-03 NOTE — Telephone Encounter (Signed)
Per niece patient has had a lot of trouble with stomach and has been using Protonix twice a day. Patient would like  Dr. Patty Sermons to approve Protonix 40 mg twice a day since this seems to really help. Will forward to  Dr. Patty Sermons for review.

## 2013-10-04 MED ORDER — PANTOPRAZOLE SODIUM 40 MG PO TBEC: 40.0000 mg | DELAYED_RELEASE_TABLET | Freq: Two times a day (BID) | ORAL | Status: DC

## 2013-10-04 NOTE — Telephone Encounter (Signed)
Left message for Sheree at her request change can be made and will send to [facility]

## 2013-10-31 ENCOUNTER — Other Ambulatory Visit: Payer: Self-pay | Admitting: Cardiology

## 2013-11-15 ENCOUNTER — Telehealth: Payer: Self-pay | Admitting: Cardiology

## 2013-11-15 NOTE — Telephone Encounter (Signed)
Discussed with niece and advised she needed ov and labs. Niece will try to make sure she is here

## 2013-11-15 NOTE — Telephone Encounter (Signed)
New Problem:  Pt's niece, Bedelia Person, is requesting a call back from [place]. She is asking about having the pt's blood work done in [place].

## 2013-11-27 ENCOUNTER — Encounter (INDEPENDENT_AMBULATORY_CARE_PROVIDER_SITE_OTHER): Payer: Self-pay

## 2013-11-27 ENCOUNTER — Encounter: Payer: Self-pay | Admitting: Cardiology

## 2013-11-27 ENCOUNTER — Ambulatory Visit (INDEPENDENT_AMBULATORY_CARE_PROVIDER_SITE_OTHER): Payer: Medicare Other | Admitting: Cardiology

## 2013-11-27 VITALS — BP 102/76 | HR 66 | Ht 63.0 in | Wt 105.0 lb

## 2013-11-27 DIAGNOSIS — E7800 Pure hypercholesterolemia, unspecified: Secondary | ICD-10-CM

## 2013-11-27 DIAGNOSIS — I5043 Acute on chronic combined systolic (congestive) and diastolic (congestive) heart failure: Secondary | ICD-10-CM

## 2013-11-27 DIAGNOSIS — G4700 Insomnia, unspecified: Secondary | ICD-10-CM

## 2013-11-27 DIAGNOSIS — I4891 Unspecified atrial fibrillation: Secondary | ICD-10-CM

## 2013-11-27 DIAGNOSIS — I259 Chronic ischemic heart disease, unspecified: Secondary | ICD-10-CM

## 2013-11-27 DIAGNOSIS — E039 Hypothyroidism, unspecified: Secondary | ICD-10-CM

## 2013-11-27 DIAGNOSIS — I509 Heart failure, unspecified: Secondary | ICD-10-CM

## 2013-11-27 DIAGNOSIS — I480 Paroxysmal atrial fibrillation: Secondary | ICD-10-CM

## 2013-11-27 DIAGNOSIS — Z79899 Other long term (current) drug therapy: Secondary | ICD-10-CM

## 2013-11-27 LAB — BASIC METABOLIC PANEL
BUN: 23 mg/dL (ref 6–23)
CO2: 33 mEq/L — ABNORMAL HIGH (ref 19–32)
Calcium: 9.4 mg/dL (ref 8.4–10.5)
Chloride: 101 mEq/L (ref 96–112)
Creatinine, Ser: 1.1 mg/dL (ref 0.4–1.2)
GFR: 51.13 mL/min — AB (ref >60.00)
GLUCOSE: 95 mg/dL (ref 70–99)
Potassium: 3.5 mEq/L (ref 3.5–5.1)
Sodium: 142 mEq/L (ref 135–145)

## 2013-11-27 MED ORDER — TEMAZEPAM 30 MG PO CAPS: ORAL_CAPSULE | ORAL | Status: DC

## 2013-11-27 NOTE — Progress Notes (Signed)
Quick Note:  Please report to patient. The recent labs are stable. Continue same medication and careful diet.Potassium is borderline low. Add Ktab 10 meq daily. ______

## 2013-11-27 NOTE — Progress Notes (Signed)
[name] Date of Birth:  1925/11/21 [address] [place], [place]  [address] [phone]         Fax   [phone]  History of Present Illness: This pleasant 80 year old woman is seen for a  followup office visit. She has a history of known ischemic heart disease. She had coronary artery bypass graft surgery in 1989. She had redo CABG in October 2000. He is a past history of paroxysmal atrial fibrillation. She is not on Coumadin because of a fall risk. She is on aspirin and Plavix because of her ischemic heart disease. She has a history of hypercholesterolemia, anxiety, insomnia, and she is legally blind. Her most recent echocardiogram 01/20/12 done in [place] showed an ejection fraction of 45-50%. She has grade 2 diastolic dysfunction.  She had a more recent echocardiogram on 04/05/13 in [facility] showing an ejection fraction 50% with mild to moderate mitral regurgitation, mild aortic insufficiency, no aortic stenosis, and pulmonary artery pressure of 50. 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 Her amiodarone was stopped four months ago. The patient is now on Synthroid. She has felt better over the past month.  The patient was recently seen in the emergency room with chest and epigastric pain and ruled out for an MI.  Since last visit she has had no new cardiac events.  She has occasional chest pain for which he takes sublingual nitroglycerin.  Current Outpatient Prescriptions  Medication Sig Dispense Refill  . ALPRAZolam (XANAX) 0.25 MG tablet TAKE 1 TABLET BY MOUTH TWICE DAILY AS NEEDED  60 tablet  3  . aspirin EC 81 MG tablet Take 81 mg by mouth daily.      Marland Kitchen atorvastatin (LIPITOR) 20 MG tablet TAKE 1 TABLET BY MOUTH EVERY DAY  90 tablet  3  . clopidogrel (PLAVIX) 75 MG tablet TAKE 1 TABLET BY MOUTH EVERY DAY  90 tablet  0  . diltiazem (CARDIZEM CD) 180 MG 24 hr capsule TAKE ONE CAPSULE (180MG ) BY MOUTH DAILY  90  capsule  0  . docusate sodium (COLACE) 50 MG capsule Take 50 mg by mouth as directed. 2 daily      . ferrous gluconate (FERGON) 240 (27 FE) MG tablet Take 240 mg by mouth daily.      . furosemide (LASIX) 40 MG tablet TAKE 1 AND 1/2 TABLETS BY MOUTH DAILY  135 tablet  0  . isosorbide mononitrate (IMDUR) 60 MG 24 hr tablet TAKE 1/2 TABLET BY MOUTH AT BEDTIME  30 tablet  1  . levothyroxine (SYNTHROID, LEVOTHROID) 50 MCG tablet TAKE 1 TABLET BY MOUTH EVERY DAY  90 tablet  0  . metoprolol succinate (TOPROL-XL) 25 MG 24 hr tablet Take 1 tablet (25 mg total) by mouth daily. The dose of this medicine was decreased from twice daily to once daily because of your borderline low heart rate.  180 tablet  3  . NITROSTAT 0.4 MG SL tablet DISSOLVE 1 TABLET UNDER TONGUE EVERY 5 MINUTES AS DIRECTED AS NEEDED FOR CHEST PAIN  25 tablet  3  . pantoprazole (PROTONIX) 40 MG tablet Take 1 tablet (40 mg total) by mouth 2 (two) times daily.  180 tablet  3  . potassium chloride SA (K-DUR,KLOR-CON) 20 MEQ tablet TAKE 1 TABLET BY MOUTH EVERY DAY  90 tablet  0  . temazepam (RESTORIL) 15 MG capsule TAKE ONE CAPSULE AT BEDTIME AS NEEDED  30 capsule  5  .  levalbuterol (XOPENEX HFA) 45 MCG/ACT inhaler Inhale 2 puffs into the lungs 2 (two) times daily.  1 Inhaler     No current facility-administered medications for this visit.    Allergies  Allergen Reactions  . Amiodarone     Hypothyroidism    Patient Active Problem List   Diagnosis Date Noted  . Paroxysmal atrial fibrillation 02/02/2011    Priority: Medium  . Dermatitis 07/06/2012  . 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 Hypothyroidism 05/29/2012  . Transaminitis 05/29/2012  . Acute bronchitis 05/28/2012  . Bradycardia 05/28/2012  . Acute renal failure 05/28/2012  . Chest pain 05/28/2012  . Thrombocytopenia 05/28/2012  . Cardiomyopathy, ischemic 12/18/2011  . Acute on chronic combined systolic and diastolic congestive heart failure 12/18/2011  . Acute  hypokalemia 12/18/2011  . Ischemic heart disease 02/02/2011  . Failed CABG (coronary artery bypass graft) 02/02/2011  . Hypercholesterolemia 02/02/2011  . Anxiety 02/02/2011  . Depression 02/02/2011  . Anemia 02/02/2011    History  Smoking status  . Former Smoker  Smokeless tobacco  . Not on file    History  Alcohol Use No    No family history on file.  Review of Systems: Constitutional: no fever chills diaphoresis or fatigue or change in weight.  Head and neck: no hearing loss, no epistaxis, no photophobia or visual disturbance. Respiratory: No cough, shortness of breath or wheezing. Cardiovascular: No chest pain peripheral edema, palpitations. Gastrointestinal: No abdominal distention, no abdominal pain, no change in bowel habits hematochezia or melena. Genitourinary: No dysuria, no frequency, no urgency, no nocturia. Musculoskeletal:No arthralgias, no back pain, no gait disturbance or myalgias. Neurological: No dizziness, no headaches, no numbness, no seizures, no syncope, no weakness, no tremors. Hematologic: No lymphadenopathy, no easy bruising. Psychiatric: No confusion, no hallucinations, no sleep disturbance.    Physical Exam: Filed Vitals:   11/27/13 1108  BP: 102/76  Pulse: 66   the general appearance reveals a well-developed thin elderly woman in no distress.The head and neck exam reveals pupils equal and reactive.  Extraocular movements are full.  There is no scleral icterus.  The mouth and pharynx are normal.  The neck is supple.  The carotids reveal no bruits.  The jugular venous pressure is normal.  The  thyroid is not enlarged.  There is no lymphadenopathy.  The chest is clear to percussion and auscultation.  There are no rales or rhonchi.  Expansion of the chest is symmetrical.  The precordium is quiet.  The first heart sound is normal.  The second heart sound is physiologically split.  There is a grade 3/6 systolic murmur at the left lower sternal edge.   There is no abnormal lift or heave.  The abdomen is soft and nontender.  The bowel sounds are normal.  The liver and spleen are not enlarged.  There are no abdominal masses.  There are no abdominal bruits.  Extremities reveal good pedal pulses.  There is no phlebitis or edema.  There is no cyanosis or clubbing.  Strength is normal and symmetrical in all extremities.  There is no lateralizing weakness.  There are no sensory deficits.  The skin is warm and dry.  There is no rash.  EKG today shows normal sinus rhythm.  Minimal voltage criteria for LVH.  Since 04/26/13, no significant change   Assessment / Plan: Continue same medication except increase dose of temazepam 30 mg at bedtime.  We're checking lab work today.  Recheck in 4 months for office visit CBC TSH  lipid panel hepatic function panel and basal metabolic panel.

## 2013-11-27 NOTE — Assessment & Plan Note (Signed)
The patient is not having any symptoms of CHF at this time.  No orthopnea or paroxysmal nocturnal dyspnea or significant ankle edema.  Her weight is up 4 pounds but this is because she is eating better.

## 2013-11-27 NOTE — Patient Instructions (Addendum)
Will obtain labs today and call you with the results (BMET)  Your physician recommends that you continue on your current medications as directed. Please refer to the Current Medication list given to you today.  Your physician wants you to follow-up in: 4 months with fasting labs (LP/BMET/HFP/CBC/TSH?)  You will receive a reminder letter in the mail two months in advance. If you don't receive a letter, please call our office to schedule the follow-up appointment.

## 2013-11-27 NOTE — Assessment & Plan Note (Signed)
The patient has not had any recurrent episodes of atrial fibrillation or racing heart.  She is no longer on amiodarone

## 2013-11-27 NOTE — Assessment & Plan Note (Signed)
The patient has sublingual nitroglycerin on hand.  She has not had to take any recent nitroglycerin

## 2013-11-27 NOTE — Assessment & Plan Note (Signed)
The patient continues to have significant problems with insomnia.  She has been on temazepam 15 mg at at bedtime.  We will increase this to 30 mg at bedtime.

## 2013-11-29 ENCOUNTER — Telehealth: Payer: Self-pay | Admitting: *Deleted

## 2013-11-29 DIAGNOSIS — E876 Hypokalemia: Secondary | ICD-10-CM

## 2013-11-29 MED ORDER — POTASSIUM CHLORIDE CRYS ER 20 MEQ PO TBCR: 20.0000 meq | EXTENDED_RELEASE_TABLET | Freq: Two times a day (BID) | ORAL | Status: DC

## 2013-11-29 NOTE — Telephone Encounter (Signed)
Message copied by Earvin Hansen on Wed Nov 29, 2013  4:00 PM ------      Message from: Darlin Coco      Created: Mon Nov 27, 2013  9:31 PM       Please report to patient.  The recent labs are stable. Continue same medication and careful diet.Potassium is borderline low.  Add Ktab 10 meq daily. ------

## 2013-11-29 NOTE — Telephone Encounter (Signed)
Advised patient of lab results. Patient currently taking K+20 meq daily and will increase to twice per  Dr. Mare Ferrari

## 2013-12-03 ENCOUNTER — Other Ambulatory Visit: Payer: Self-pay | Admitting: Cardiology

## 2013-12-21 ENCOUNTER — Telehealth: Payer: Self-pay | Admitting: Cardiology

## 2013-12-21 NOTE — Telephone Encounter (Signed)
Spoke with niece and patient had chest pain and had to use  NTG a few days ago. Now she is concerned with taking the recently increased dose of Temazepam. Advised niece to have her continue present dose of Temazepam since it is helping her sleep.

## 2013-12-21 NOTE — Telephone Encounter (Signed)
New problem   Need to speak to nurse concerning pt.

## 2013-12-26 ENCOUNTER — Other Ambulatory Visit: Payer: Self-pay | Admitting: Cardiology

## 2014-01-22 ENCOUNTER — Other Ambulatory Visit: Payer: Self-pay | Admitting: Cardiology

## 2014-03-23 ENCOUNTER — Other Ambulatory Visit: Payer: Self-pay | Admitting: *Deleted

## 2014-03-23 ENCOUNTER — Telehealth: Payer: Self-pay

## 2014-03-23 DIAGNOSIS — F419 Anxiety disorder, unspecified: Secondary | ICD-10-CM

## 2014-03-23 NOTE — Telephone Encounter (Signed)
Xanax refilled as requested 

## 2014-03-26 MED ORDER — ALPRAZOLAM 0.25 MG PO TABS: ORAL_TABLET | ORAL | Status: DC

## 2014-04-12 ENCOUNTER — Ambulatory Visit (INDEPENDENT_AMBULATORY_CARE_PROVIDER_SITE_OTHER): Payer: Medicare Other | Admitting: Cardiology

## 2014-04-12 ENCOUNTER — Encounter: Payer: Self-pay | Admitting: Cardiology

## 2014-04-12 ENCOUNTER — Other Ambulatory Visit: Payer: Medicare Other

## 2014-04-12 VITALS — BP 140/84 | HR 84 | Ht 63.0 in | Wt 107.0 lb

## 2014-04-12 DIAGNOSIS — I509 Heart failure, unspecified: Secondary | ICD-10-CM

## 2014-04-12 DIAGNOSIS — I4891 Unspecified atrial fibrillation: Secondary | ICD-10-CM

## 2014-04-12 DIAGNOSIS — E039 Hypothyroidism, unspecified: Secondary | ICD-10-CM

## 2014-04-12 DIAGNOSIS — E7800 Pure hypercholesterolemia, unspecified: Secondary | ICD-10-CM

## 2014-04-12 DIAGNOSIS — Z79899 Other long term (current) drug therapy: Secondary | ICD-10-CM

## 2014-04-12 DIAGNOSIS — I5043 Acute on chronic combined systolic (congestive) and diastolic (congestive) heart failure: Secondary | ICD-10-CM

## 2014-04-12 DIAGNOSIS — I259 Chronic ischemic heart disease, unspecified: Secondary | ICD-10-CM

## 2014-04-12 DIAGNOSIS — I480 Paroxysmal atrial fibrillation: Secondary | ICD-10-CM

## 2014-04-12 LAB — BASIC METABOLIC PANEL
BUN: 22 mg/dL (ref 6–23)
CALCIUM: 9.5 mg/dL (ref 8.4–10.5)
CO2: 31 meq/L (ref 19–32)
Chloride: 100 mEq/L (ref 96–112)
Creatinine, Ser: 1 mg/dL (ref 0.4–1.2)
GFR: 57.83 mL/min — AB (ref >60.00)
Glucose, Bld: 93 mg/dL (ref 70–99)
Potassium: 3.8 mEq/L (ref 3.5–5.1)
SODIUM: 140 meq/L (ref 135–145)

## 2014-04-12 LAB — LIPID PANEL
Cholesterol: 156 mg/dL (ref 0–200)
HDL: 66.7 mg/dL (ref >39.00)
LDL Cholesterol: 64 mg/dL (ref 0–99)
NonHDL: 89.3
Total CHOL/HDL Ratio: 2
Triglycerides: 125 mg/dL (ref 0.0–149.0)
VLDL: 25 mg/dL (ref 0.0–40.0)

## 2014-04-12 LAB — CBC WITH DIFFERENTIAL/PLATELET
BASOS PCT: 0.7 % (ref 0.0–3.0)
Basophils Absolute: 0.1 10*3/uL (ref 0.0–0.1)
Eosinophils Absolute: 0.2 10*3/uL (ref 0.0–0.7)
Eosinophils Relative: 1.9 % (ref 0.0–5.0)
HEMATOCRIT: 39.5 % (ref 36.0–46.0)
Hemoglobin: 13.4 g/dL (ref 12.0–15.0)
Lymphocytes Relative: 23.5 % (ref 12.0–46.0)
Lymphs Abs: 2 10*3/uL (ref 0.7–4.0)
MCHC: 34 g/dL (ref 30.0–36.0)
MCV: 84.4 fl (ref 78.0–100.0)
MONOS PCT: 7.6 % (ref 3.0–12.0)
Monocytes Absolute: 0.6 10*3/uL (ref 0.1–1.0)
NEUTROS ABS: 5.6 10*3/uL (ref 1.4–7.7)
Neutrophils Relative %: 66.3 % (ref 43.0–77.0)
Platelets: 196 10*3/uL (ref 150.0–400.0)
RBC: 4.68 Mil/uL (ref 3.87–5.11)
RDW: 13.9 % (ref 11.5–15.5)
WBC: 8.4 10*3/uL (ref 4.0–10.5)

## 2014-04-12 LAB — HEPATIC FUNCTION PANEL
ALBUMIN: 4 g/dL (ref 3.5–5.2)
ALT: 7 U/L (ref 0–35)
AST: 19 U/L (ref 0–37)
Alkaline Phosphatase: 80 U/L (ref 39–117)
Bilirubin, Direct: 0.1 mg/dL (ref 0.0–0.3)
Total Bilirubin: 1 mg/dL (ref 0.2–1.2)
Total Protein: 6.6 g/dL (ref 6.0–8.3)

## 2014-04-12 LAB — TSH: TSH: 1.8 u[IU]/mL (ref 0.35–4.50)

## 2014-04-12 NOTE — Patient Instructions (Addendum)
Will obtain labs today and call you with the results (lp/bmet/hfp/cbc/tsh)  our physician recommends that you continue on your current medications as directed. Please refer to the Current Medication list given to you today.  Your physician wants you to follow-up in: 5 month ov/ekg You will receive a reminder letter in the mail two months in advance. If you don't receive a letter, please call our office to schedule the follow-up appointment.

## 2014-04-12 NOTE — Assessment & Plan Note (Signed)
She is clinically euthyroid on current Synthroid.  We are checking a TSH today.

## 2014-04-12 NOTE — Assessment & Plan Note (Signed)
The patient has not been aware of any racing of her heart or recurrent atrial fibrillation.

## 2014-04-12 NOTE — Progress Notes (Signed)
Quick Note:  Please report to patient. The recent labs are stable. Continue same medication and careful diet. ______ 

## 2014-04-12 NOTE — Assessment & Plan Note (Signed)
The patient denies any significant dyspnea at this time.

## 2014-04-12 NOTE — Progress Notes (Signed)
[name] Date of Birth:  12-08-1925 [facility] [address] [place], [place]  [address] [phone]        Fax   [phone]   History of Present Illness: This pleasant 80 year old woman is seen for a followup office visit. She has a history of known ischemic heart disease. She had coronary artery bypass graft surgery in 1989. She had redo CABG in October 2000. He is a past history of paroxysmal atrial fibrillation. She is not on Coumadin because of a fall risk. She is on aspirin and Plavix because of her ischemic heart disease. She has a history of hypercholesterolemia, anxiety, insomnia, and she is legally blind. Her most recent echocardiogram 01/20/12 done in [place] showed an ejection fraction of 45-50%. She has grade 2 diastolic dysfunction. She had a more recent echocardiogram on 04/05/13 in [facility] showing an ejection fraction 50% with mild to moderate mitral regurgitation, mild aortic insufficiency, no aortic stenosis, and pulmonary artery pressure of 50. 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 Her amiodarone was stopped four months ago. The patient is now on Synthroid. She has felt better over the past month. The patient was recently seen in the emergency room with chest and epigastric pain and ruled out for an MI. Since last visit she has had no new cardiac events. She has occasional chest pain for which he takes sublingual nitroglycerin. She states that she feels better now than she has a long time.  This may be beneficial effect of treating her hypothyroidism.   Current Outpatient Prescriptions  Medication Sig Dispense Refill  . ALPRAZolam (XANAX) 0.25 MG tablet TAKE 1 TABLET BY MOUTH TWICE DAILY AS NEEDED  60 tablet  5  . aspirin EC 81 MG tablet Take 81 mg by mouth daily.      Marland Kitchen atorvastatin (LIPITOR) 20 MG tablet TAKE 1 TABLET BY MOUTH EVERY DAY  90 tablet  3  . clopidogrel (PLAVIX) 75 MG  tablet TAKE 1 TABLET BY MOUTH EVERY DAY  90 tablet  1  . diltiazem (CARDIZEM CD) 180 MG 24 hr capsule TAKE ONE CAPSULE BY MOUTH DAILY  90 capsule  1  . docusate sodium (COLACE) 50 MG capsule Take 50 mg by mouth as directed. 2 daily      . ferrous gluconate (FERGON) 240 (27 FE) MG tablet Take 240 mg by mouth daily.      . furosemide (LASIX) 40 MG tablet TAKE 1 AND 1/2 TABLETS BY MOUTH DAILY  135 tablet  1  . isosorbide mononitrate (IMDUR) 60 MG 24 hr tablet TAKE 1/2 TABLET BY MOUTH AT BEDTIME  30 tablet  1  . levothyroxine (SYNTHROID, LEVOTHROID) 50 MCG tablet TAKE 1 TABLET BY MOUTH EVERY DAY  90 tablet  1  . metoprolol succinate (TOPROL-XL) 25 MG 24 hr tablet Take 1 tablet (25 mg total) by mouth daily. The dose of this medicine was decreased from twice daily to once daily because of your borderline low heart rate.  180 tablet  3  . metoprolol succinate (TOPROL-XL) 25 MG 24 hr tablet TAKE 1 TABLET EVERY DAY  90 tablet  0  . NITROSTAT 0.4 MG SL tablet DISSOLVE 1 TABLET UNDER TONGUE EVERY 5 MINUTES AS DIRECTED AS NEEDED FOR CHEST PAIN  25 tablet  3  . pantoprazole (PROTONIX) 40 MG tablet Take 1 tablet (40 mg total) by mouth 2 (two) times daily.  180 tablet  3  .  potassium chloride SA (K-DUR,KLOR-CON) 20 MEQ tablet Take 1 tablet (20 mEq total) by mouth 2 (two) times daily.  180 tablet  3  . temazepam (RESTORIL) 30 MG capsule TAKE ONE CAPSULE AT BEDTIME AS NEEDED  30 capsule  5   No current facility-administered medications for this visit.    Allergies  Allergen Reactions  . Amiodarone     Hypothyroidism    Patient Active Problem List   Diagnosis Date Noted  . Paroxysmal atrial fibrillation 02/02/2011    Priority: Medium  . Insomnia 11/27/2013  . Dermatitis 07/06/2012  . 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 Hypothyroidism 05/29/2012  . Transaminitis 05/29/2012  . Acute bronchitis 05/28/2012  . Bradycardia 05/28/2012  . Acute renal failure 05/28/2012  . Chest pain 05/28/2012    . Thrombocytopenia 05/28/2012  . Cardiomyopathy, ischemic 12/18/2011  . Acute on chronic combined systolic and diastolic congestive heart failure 12/18/2011  . Acute hypokalemia 12/18/2011  . Ischemic heart disease 02/02/2011  . Failed CABG (coronary artery bypass graft) 02/02/2011  . Hypercholesterolemia 02/02/2011  . Anxiety 02/02/2011  . Depression 02/02/2011  . Anemia 02/02/2011    History  Smoking status  . Former Smoker  Smokeless tobacco  . Not on file    History  Alcohol Use No    No family history on file.  Review of Systems: Constitutional: no fever chills diaphoresis or fatigue or change in weight.  Head and neck: no hearing loss, no epistaxis, no photophobia or visual disturbance. Respiratory: No cough, shortness of breath or wheezing. Cardiovascular: No chest pain peripheral edema, palpitations. Gastrointestinal: No abdominal distention, no abdominal pain, no change in bowel habits hematochezia or melena. Genitourinary: No dysuria, no frequency, no urgency, no nocturia. Musculoskeletal:No arthralgias, no back pain, no gait disturbance or myalgias. Neurological: No dizziness, no headaches, no numbness, no seizures, no syncope, no weakness, no tremors. Hematologic: No lymphadenopathy, no easy bruising. Psychiatric: No confusion, no hallucinations, no sleep disturbance.    Physical Exam: Filed Vitals:   04/12/14 1114  BP: 140/84  Pulse: 84   the general appearance reveals a well-developed well-nourished elderly woman in no distress.  She is hard of hearing and she is legally blind.The head and neck exam reveals pupils equal and reactive.  Extraocular movements are full.  There is no scleral icterus.  The mouth and pharynx are normal.  The neck is supple.  The carotids reveal no bruits.  The jugular venous pressure is normal.  The  thyroid is not enlarged.  There is no lymphadenopathy.  The chest is clear to percussion and auscultation.  There are no rales or  rhonchi.  Expansion of the chest is symmetrical.  The precordium is quiet.  The first heart sound is normal.  The second heart sound is physiologically split.  There is no murmur gallop rub or click.  There is no abnormal lift or heave.  The abdomen is soft and nontender.  The bowel sounds are normal.  The liver and spleen are not enlarged.  There are no abdominal masses.  There are no abdominal bruits.  Extremities reveal good pedal pulses.  There is no phlebitis or edema.  There is no cyanosis or clubbing.  Strength is normal and symmetrical in all extremities.  There is no lateralizing weakness.  There are no sensory deficits.  The skin is warm and dry.  There is no rash.     Assessment / Plan: 1. ischemic heart disease status post CABG in 1989 and redo CABG  in 2000 2. paroxysmal atrial fibrillation not on Coumadin because of fall risk 3. grade 2 diastolic dysfunction 4. hypothyroidism developing while on previous amiodarone  Plan: We are checking lab work today.  Recheck in 5 months for followup office visit and EKG

## 2014-04-25 ENCOUNTER — Other Ambulatory Visit: Payer: Self-pay | Admitting: Cardiology

## 2014-04-25 ENCOUNTER — Other Ambulatory Visit: Payer: Self-pay

## 2014-04-25 MED ORDER — ISOSORBIDE MONONITRATE ER 60 MG PO TB24: ORAL_TABLET | ORAL | Status: DC

## 2014-05-24 ENCOUNTER — Other Ambulatory Visit: Payer: Self-pay | Admitting: Cardiology

## 2014-05-24 DIAGNOSIS — G4700 Insomnia, unspecified: Secondary | ICD-10-CM

## 2014-05-24 NOTE — Telephone Encounter (Signed)
Left message to call back  

## 2014-05-24 NOTE — Telephone Encounter (Signed)
New message    Fax in temazepam  30 mg    Was told to talk with Rip Harbour regarding rx.

## 2014-05-25 MED ORDER — TEMAZEPAM 30 MG PO CAPS
ORAL_CAPSULE | ORAL | Status: DC
Start: ? — End: 2014-11-28

## 2014-05-25 NOTE — Telephone Encounter (Signed)
Refilled Temazepam as requested  

## 2014-05-30 ENCOUNTER — Other Ambulatory Visit: Payer: Self-pay | Admitting: Cardiovascular Disease

## 2014-07-22 ENCOUNTER — Other Ambulatory Visit: Payer: Self-pay | Admitting: Cardiology

## 2014-07-23 ENCOUNTER — Other Ambulatory Visit: Payer: Self-pay

## 2014-07-23 MED ORDER — FUROSEMIDE 40 MG PO TABS: ORAL_TABLET | ORAL | Status: DC

## 2014-07-23 MED ORDER — LEVOTHYROXINE SODIUM 50 MCG PO TABS: ORAL_TABLET | ORAL | Status: DC

## 2014-07-23 MED ORDER — CLOPIDOGREL BISULFATE 75 MG PO TABS: ORAL_TABLET | ORAL | Status: DC

## 2014-07-24 ENCOUNTER — Other Ambulatory Visit: Payer: Self-pay | Admitting: Cardiology

## 2014-07-24 ENCOUNTER — Other Ambulatory Visit: Payer: Self-pay

## 2014-08-28 ENCOUNTER — Other Ambulatory Visit: Payer: Self-pay | Admitting: Cardiology

## 2014-09-13 ENCOUNTER — Ambulatory Visit: Payer: Medicare Other | Admitting: Cardiology

## 2014-09-30 ENCOUNTER — Other Ambulatory Visit: Payer: Self-pay | Admitting: Cardiology

## 2014-09-30 DIAGNOSIS — F419 Anxiety disorder, unspecified: Secondary | ICD-10-CM

## 2014-10-01 ENCOUNTER — Other Ambulatory Visit: Payer: Self-pay | Admitting: Cardiology

## 2014-10-01 DIAGNOSIS — F419 Anxiety disorder, unspecified: Secondary | ICD-10-CM

## 2014-10-01 MED ORDER — ALPRAZOLAM 0.25 MG PO TABS: 0.2500 mg | ORAL_TABLET | Freq: Two times a day (BID) | ORAL | Status: DC | PRN

## 2014-10-01 NOTE — Telephone Encounter (Signed)
New message     Refill alprazolan----[facility] [phone]

## 2014-10-18 ENCOUNTER — Ambulatory Visit (INDEPENDENT_AMBULATORY_CARE_PROVIDER_SITE_OTHER): Payer: Medicare Other | Admitting: Cardiology

## 2014-10-18 ENCOUNTER — Encounter: Payer: Self-pay | Admitting: Cardiology

## 2014-10-18 ENCOUNTER — Ambulatory Visit (INDEPENDENT_AMBULATORY_CARE_PROVIDER_SITE_OTHER): Payer: Medicare Other | Admitting: *Deleted

## 2014-10-18 VITALS — BP 100/60 | HR 65 | Ht 63.0 in | Wt 106.0 lb

## 2014-10-18 DIAGNOSIS — E876 Hypokalemia: Secondary | ICD-10-CM

## 2014-10-18 DIAGNOSIS — I480 Paroxysmal atrial fibrillation: Secondary | ICD-10-CM

## 2014-10-18 DIAGNOSIS — G4700 Insomnia, unspecified: Secondary | ICD-10-CM

## 2014-10-18 DIAGNOSIS — Z23 Encounter for immunization: Secondary | ICD-10-CM

## 2014-10-18 DIAGNOSIS — R001 Bradycardia, unspecified: Secondary | ICD-10-CM

## 2014-10-18 DIAGNOSIS — I259 Chronic ischemic heart disease, unspecified: Secondary | ICD-10-CM

## 2014-10-18 DIAGNOSIS — E039 Hypothyroidism, unspecified: Secondary | ICD-10-CM

## 2014-10-18 DIAGNOSIS — R079 Chest pain, unspecified: Secondary | ICD-10-CM

## 2014-10-18 MED ORDER — METOPROLOL SUCCINATE ER 25 MG PO TB24: 25.0000 mg | ORAL_TABLET | Freq: Every day | ORAL | Status: DC

## 2014-10-18 MED ORDER — FUROSEMIDE 40 MG PO TABS: ORAL_TABLET | ORAL | Status: DC

## 2014-10-18 MED ORDER — NITROGLYCERIN 0.4 MG SL SUBL: SUBLINGUAL_TABLET | SUBLINGUAL | Status: DC

## 2014-10-18 MED ORDER — LEVOTHYROXINE SODIUM 50 MCG PO TABS: ORAL_TABLET | ORAL | Status: DC

## 2014-10-18 MED ORDER — POTASSIUM CHLORIDE CRYS ER 20 MEQ PO TBCR: 20.0000 meq | EXTENDED_RELEASE_TABLET | Freq: Two times a day (BID) | ORAL | Status: DC

## 2014-10-18 MED ORDER — DILTIAZEM HCL ER COATED BEADS 180 MG PO CP24: 180.0000 mg | ORAL_CAPSULE | Freq: Every day | ORAL | Status: DC

## 2014-10-18 MED ORDER — ATORVASTATIN CALCIUM 20 MG PO TABS: 20.0000 mg | ORAL_TABLET | Freq: Every day | ORAL | Status: DC

## 2014-10-18 MED ORDER — CLOPIDOGREL BISULFATE 75 MG PO TABS: ORAL_TABLET | ORAL | Status: DC

## 2014-10-18 MED ORDER — PANTOPRAZOLE SODIUM 40 MG PO TBEC: 40.0000 mg | DELAYED_RELEASE_TABLET | Freq: Two times a day (BID) | ORAL | Status: DC

## 2014-10-18 NOTE — Patient Instructions (Addendum)
Your physician recommends that you return for lab work in: TODAY (BMET/CBC/HFP/TSH/FT4)  Your physician recommends that you continue on your current medications as directed. Please refer to the Current Medication list given to you today.  Okay to try Biotene over the counter for your mouth.   Your physician wants you to follow-up in: 5 months with Dr. Mare Ferrari and fasting lab. You will receive a reminder letter in the mail two months in advance. If you don't receive a letter, please call our office to schedule the follow-up appointment.

## 2014-10-18 NOTE — Assessment & Plan Note (Signed)
The patient is maintaining normal sinus rhythm on low-dose amiodarone.

## 2014-10-18 NOTE — Assessment & Plan Note (Signed)
The patient has severe insomnia.  We refilled her temazepam.

## 2014-10-18 NOTE — Assessment & Plan Note (Signed)
The patient has not had any recurrent chest pain or angina. 

## 2014-10-18 NOTE — Progress Notes (Signed)
[name] Date of Birth:  03-Apr-1926 [facility] [address] [place], [place]  [address] [phone]        Fax   [phone]   History of Present Illness: This pleasant 80 year old woman is seen for a followup office visit. She has a history of known ischemic heart disease. She had coronary artery bypass graft surgery in 1989. She had redo CABG in October 2000. He is a past history of paroxysmal atrial fibrillation. She is not on Coumadin because of a fall risk. She is on aspirin and Plavix because of her ischemic heart disease. She has a history of hypercholesterolemia, anxiety, insomnia, and she is legally blind. Her most recent echocardiogram 01/20/12 done in [place] showed an ejection fraction of 45-50%. She has grade 2 diastolic dysfunction. She had a more recent echocardiogram on 04/05/13 in [facility] showing an ejection fraction 50% with mild to moderate mitral regurgitation, mild aortic insufficiency, no aortic stenosis, and pulmonary artery pressure of 50. 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 Her amiodarone was stopped four months ago. The patient is now on Synthroid. She has felt better over the past month. The patient was recently seen in the emergency room with chest and epigastric pain and ruled out for an MI. Since last visit she has had no new cardiac events. She has occasional chest pain for which he takes sublingual nitroglycerin. She states that she feels better now than she has a long time.  This may be beneficial effect of treating her hypothyroidism. She has been somewhat depressed recently over the death of her long time friend Mel. Her daughter brought her today.  The patient lives by herself but her daughter checks on her frequently.  Current Outpatient Prescriptions  Medication Sig Dispense Refill  . ALPRAZolam (XANAX) 0.25 MG tablet Take 1 tablet (0.25 mg total) by mouth 2 (two)  times daily as needed. 60 tablet 5  . aspirin EC 81 MG tablet Take 81 mg by mouth daily.    Marland Kitchen atorvastatin (LIPITOR) 20 MG tablet Take 1 tablet (20 mg total) by mouth daily. 90 tablet 3  . clopidogrel (PLAVIX) 75 MG tablet TAKE 1 TABLET BY MOUTH EVERY DAY 90 tablet 3  . diltiazem (CARDIZEM CD) 180 MG 24 hr capsule Take 1 capsule (180 mg total) by mouth daily. 90 capsule 3  . docusate sodium (COLACE) 50 MG capsule Take 50 mg by mouth as directed. 2 daily    . ferrous gluconate (FERGON) 240 (27 FE) MG tablet Take 240 mg by mouth daily.    . furosemide (LASIX) 40 MG tablet TAKE 1 AND 1/2 TABLETS BY MOUTH DAILY 135 tablet 3  . isosorbide mononitrate (IMDUR) 60 MG 24 hr tablet TAKE 1/2 TABLET BY MOUTH AT BEDTIME 30 tablet 6  . levothyroxine (SYNTHROID, LEVOTHROID) 50 MCG tablet TAKE 1 TABLET BY MOUTH EVERY DAY 90 tablet 3  . metoprolol succinate (TOPROL-XL) 25 MG 24 hr tablet Take 1 tablet (25 mg total) by mouth daily. 90 tablet 3  . nitroGLYCERIN (NITROSTAT) 0.4 MG SL tablet DISSOLVE 1 TABLET UNDER TONGUE EVERY 5 MINUTES AS DIRECTED AS NEEDED FOR CHEST PAIN 25 tablet 3  . pantoprazole (PROTONIX) 40 MG tablet Take 1 tablet (40 mg total) by mouth 2 (two) times daily. 180 tablet 3  . potassium chloride SA (K-DUR,KLOR-CON) 20 MEQ tablet Take 1 tablet (20 mEq total) by mouth 2 (two) times daily. 180 tablet  3  . temazepam (RESTORIL) 30 MG capsule TAKE ONE CAPSULE AT BEDTIME AS NEEDED 30 capsule 5   No current facility-administered medications for this visit.    Allergies  Allergen Reactions  . Amiodarone     Hypothyroidism    Patient Active Problem List   Diagnosis Date Noted  . Paroxysmal atrial fibrillation 02/02/2011    Priority: Medium  . Insomnia 11/27/2013  . Dermatitis 07/06/2012  . 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 Hypothyroidism 05/29/2012  . Transaminitis 05/29/2012  . Acute bronchitis 05/28/2012  . Bradycardia 05/28/2012  . Acute renal failure 05/28/2012  . Chest  pain 05/28/2012  . Thrombocytopenia 05/28/2012  . Cardiomyopathy, ischemic 12/18/2011  . Acute on chronic combined systolic and diastolic congestive heart failure 12/18/2011  . Acute hypokalemia 12/18/2011  . Ischemic heart disease 02/02/2011  . Failed CABG (coronary artery bypass graft) 02/02/2011  . Hypercholesterolemia 02/02/2011  . Anxiety 02/02/2011  . Depression 02/02/2011  . Anemia 02/02/2011    History  Smoking status  . Former Smoker  Smokeless tobacco  . Not on file    History  Alcohol Use No    No family history on file.  Review of Systems: Constitutional: no fever chills diaphoresis or fatigue or change in weight.  Head and neck: no hearing loss, no epistaxis, no photophobia or visual disturbance. Respiratory: No cough, shortness of breath or wheezing. Cardiovascular: No chest pain peripheral edema, palpitations. Gastrointestinal: No abdominal distention, no abdominal pain, no change in bowel habits hematochezia or melena. Genitourinary: No dysuria, no frequency, no urgency, no nocturia. Musculoskeletal:No arthralgias, no back pain, no gait disturbance or myalgias. Neurological: No dizziness, no headaches, no numbness, no seizures, no syncope, no weakness, no tremors. Hematologic: No lymphadenopathy, no easy bruising. Psychiatric: No confusion, no hallucinations, no sleep disturbance.    Physical Exam: Filed Vitals:   10/18/14 1511  BP: 100/60  Pulse: 65   the general appearance reveals a well-developed well-nourished elderly woman in no distress.  She is hard of hearing and she is legally blind.The head and neck exam reveals pupils equal and reactive.  Extraocular movements are full.  There is no scleral icterus.  The mouth and pharynx are normal.  The neck is supple.  The carotids reveal no bruits.  The jugular venous pressure is normal.  The  thyroid is not enlarged.  There is no lymphadenopathy.  The chest is clear to percussion and auscultation.  There  are no rales or rhonchi.  Expansion of the chest is symmetrical.  The precordium is quiet.  The first heart sound is normal.  The second heart sound is physiologically split.  There is no murmur gallop rub or click.  There is no abnormal lift or heave.  The abdomen is soft and nontender.  The bowel sounds are normal.  The liver and spleen are not enlarged.  There are no abdominal masses.  There are no abdominal bruits.  Extremities reveal good pedal pulses.  There is no phlebitis or edema.  There is no cyanosis or clubbing.  Strength is normal and symmetrical in all extremities.  There is no lateralizing weakness.  There are no sensory deficits.  The skin is warm and dry.  There is no rash.     Assessment / Plan: 1. ischemic heart disease status post CABG in 1989 and redo CABG in 2000 2. paroxysmal atrial fibrillation not on Coumadin because of fall risk 3. grade 2 diastolic dysfunction 4. hypothyroidism developing while on previous amiodarone  Plan: We are checking lab work today.  Recheck in 5 months for followup office visit lipid panel hepatic function panel and basal metabolic panel. Her last chest x-ray was in June 2014.  Consider getting chest x-ray after next office visit.

## 2014-10-19 LAB — CBC WITH DIFFERENTIAL/PLATELET
BASOS PCT: 1.1 % (ref 0.0–3.0)
Basophils Absolute: 0.1 10*3/uL (ref 0.0–0.1)
EOS PCT: 4.1 % (ref 0.0–5.0)
Eosinophils Absolute: 0.3 10*3/uL (ref 0.0–0.7)
HCT: 36.9 % (ref 36.0–46.0)
Hemoglobin: 12.1 g/dL (ref 12.0–15.0)
LYMPHS PCT: 26.6 % (ref 12.0–46.0)
Lymphs Abs: 2.1 10*3/uL (ref 0.7–4.0)
MCHC: 32.8 g/dL (ref 30.0–36.0)
MCV: 87 fl (ref 78.0–100.0)
MONO ABS: 0.6 10*3/uL (ref 0.1–1.0)
Monocytes Relative: 7.1 % (ref 3.0–12.0)
NEUTROS PCT: 61.1 % (ref 43.0–77.0)
Neutro Abs: 4.8 10*3/uL (ref 1.4–7.7)
PLATELETS: 200 10*3/uL (ref 150.0–400.0)
RBC: 4.24 Mil/uL (ref 3.87–5.11)
RDW: 13.5 % (ref 11.5–15.5)
WBC: 7.9 10*3/uL (ref 4.0–10.5)

## 2014-10-19 LAB — BASIC METABOLIC PANEL
BUN: 30 mg/dL — ABNORMAL HIGH (ref 6–23)
CALCIUM: 9.4 mg/dL (ref 8.4–10.5)
CO2: 32 mEq/L (ref 19–32)
CREATININE: 1.2 mg/dL (ref 0.4–1.2)
Chloride: 102 mEq/L (ref 96–112)
GFR: 45.18 mL/min — ABNORMAL LOW (ref >60.00)
Glucose, Bld: 107 mg/dL — ABNORMAL HIGH (ref 70–99)
Potassium: 4.3 mEq/L (ref 3.5–5.1)
Sodium: 141 mEq/L (ref 135–145)

## 2014-10-19 LAB — HEPATIC FUNCTION PANEL
ALK PHOS: 92 U/L (ref 39–117)
ALT: 7 U/L (ref 0–35)
AST: 15 U/L (ref 0–37)
Albumin: 4 g/dL (ref 3.5–5.2)
BILIRUBIN DIRECT: 0.1 mg/dL (ref 0.0–0.3)
BILIRUBIN TOTAL: 0.8 mg/dL (ref 0.2–1.2)
TOTAL PROTEIN: 6.5 g/dL (ref 6.0–8.3)

## 2014-10-22 ENCOUNTER — Other Ambulatory Visit: Payer: Medicare Other

## 2014-10-22 DIAGNOSIS — E038 Other specified hypothyroidism: Secondary | ICD-10-CM

## 2014-10-22 NOTE — Progress Notes (Signed)
Quick Note:  Please report to patient. The recent labs are stable. Continue same medication and careful diet. BUN higher. Drink more water. ______

## 2014-10-23 ENCOUNTER — Telehealth: Payer: Self-pay | Admitting: Cardiology

## 2014-10-23 LAB — T4, FREE: FREE T4: 1.48 ng/dL (ref 0.80–1.80)

## 2014-10-23 LAB — TSH: TSH: 1.21 u[IU]/mL (ref 0.350–4.500)

## 2014-10-23 NOTE — Telephone Encounter (Signed)
-----   Message from Darlin Coco, MD sent at 10/23/2014  7:53 AM EST ----- Thyroid function is normal.  Please report.

## 2014-10-23 NOTE — Telephone Encounter (Signed)
Follow Up        Pt's niece calling stating that someone called pt to tell pt about lab results and pt called her stating she didn't understand anything that was said to her. Please call pt's niece about lab results.

## 2014-10-23 NOTE — Telephone Encounter (Signed)
New problem    Pt's niece stated Karlene Einstein called concerning labs results. Please call pt's niece.

## 2014-10-23 NOTE — Telephone Encounter (Signed)
Advised niece of lab

## 2014-10-23 NOTE — Telephone Encounter (Signed)
-----   Message from Darlin Coco, MD sent at 10/22/2014  9:01 PM EST ----- Please report to patient.  The recent labs are stable. Continue same medication and careful diet. BUN higher.  Drink more water.

## 2014-11-28 ENCOUNTER — Other Ambulatory Visit: Payer: Self-pay | Admitting: Cardiology

## 2014-11-28 DIAGNOSIS — G4700 Insomnia, unspecified: Secondary | ICD-10-CM

## 2014-11-28 NOTE — Telephone Encounter (Signed)
New message      Refill temazepan at [facility] in [place]

## 2015-01-24 ENCOUNTER — Other Ambulatory Visit: Payer: Self-pay | Admitting: Cardiology

## 2015-01-24 DIAGNOSIS — F419 Anxiety disorder, unspecified: Secondary | ICD-10-CM

## 2015-01-24 NOTE — Telephone Encounter (Signed)
Left message to call back on cell phone [phone]

## 2015-01-24 NOTE — Telephone Encounter (Signed)
Left message to call back  

## 2015-01-24 NOTE — Telephone Encounter (Signed)
New message     Niece calling wants [name] to called - did not disclose any information .

## 2015-01-25 NOTE — Telephone Encounter (Signed)
Spoke with niece and patient has increased stress Per niece she has been diagnosed with breast cancer Patient would like to see if she could increase her Xanax to 3 times a day Will forward to  Dr. Mare Ferrari for review

## 2015-01-27 NOTE — Telephone Encounter (Signed)
Okay to increase xanax to TID

## 2015-01-28 MED ORDER — ALPRAZOLAM 0.25 MG PO TABS: 0.2500 mg | ORAL_TABLET | Freq: Three times a day (TID) | ORAL | Status: DC | PRN

## 2015-01-28 NOTE — Telephone Encounter (Signed)
Called new dose to [facility] Left message to call back as requested

## 2015-02-14 ENCOUNTER — Telehealth: Payer: Self-pay | Admitting: Cardiology

## 2015-02-14 NOTE — Telephone Encounter (Signed)
Informed pt's niece that Dr. Mare Ferrari and his nurse are not in the office today. Advised that they would be in the office tomorrow and that I would forward this to them to address. She is agreeable to plan.

## 2015-02-14 NOTE — Telephone Encounter (Signed)
New Message  Pt niece calling- wanted to speak w/ Rn about pt's gums, was adamant about Dr. Mare Ferrari being able to send in rx for that. Please call back and discuss.

## 2015-02-15 NOTE — Telephone Encounter (Signed)
No answer

## 2015-02-15 NOTE — Telephone Encounter (Signed)
No voicemail set up will try again later

## 2015-02-26 NOTE — Telephone Encounter (Signed)
No answer times 2 today, no voicemail set up

## 2015-03-26 ENCOUNTER — Ambulatory Visit (INDEPENDENT_AMBULATORY_CARE_PROVIDER_SITE_OTHER): Payer: Medicare Other | Admitting: Cardiology

## 2015-03-26 ENCOUNTER — Encounter: Payer: Self-pay | Admitting: Cardiology

## 2015-03-26 VITALS — BP 124/58 | HR 77 | Ht 63.0 in | Wt 107.8 lb

## 2015-03-26 DIAGNOSIS — E039 Hypothyroidism, unspecified: Secondary | ICD-10-CM

## 2015-03-26 DIAGNOSIS — E7800 Pure hypercholesterolemia, unspecified: Secondary | ICD-10-CM

## 2015-03-26 DIAGNOSIS — I480 Paroxysmal atrial fibrillation: Secondary | ICD-10-CM

## 2015-03-26 DIAGNOSIS — I259 Chronic ischemic heart disease, unspecified: Secondary | ICD-10-CM | POA: Diagnosis not present

## 2015-03-26 LAB — TSH: TSH: 0.18 u[IU]/mL — ABNORMAL LOW (ref 0.35–4.50)

## 2015-03-26 LAB — HEPATIC FUNCTION PANEL
ALT: 7 U/L (ref 0–35)
AST: 13 U/L (ref 0–37)
Albumin: 4 g/dL (ref 3.5–5.2)
Alkaline Phosphatase: 89 U/L (ref 39–117)
BILIRUBIN DIRECT: 0.2 mg/dL (ref 0.0–0.3)
Total Bilirubin: 0.6 mg/dL (ref 0.2–1.2)
Total Protein: 6.5 g/dL (ref 6.0–8.3)

## 2015-03-26 LAB — CBC WITH DIFFERENTIAL/PLATELET
BASOS ABS: 0 10*3/uL (ref 0.0–0.1)
Basophils Relative: 0.5 % (ref 0.0–3.0)
EOS PCT: 3.9 % (ref 0.0–5.0)
Eosinophils Absolute: 0.3 10*3/uL (ref 0.0–0.7)
HEMATOCRIT: 38.2 % (ref 36.0–46.0)
HEMOGLOBIN: 12.9 g/dL (ref 12.0–15.0)
Lymphocytes Relative: 21.7 % (ref 12.0–46.0)
Lymphs Abs: 1.8 10*3/uL (ref 0.7–4.0)
MCHC: 33.8 g/dL (ref 30.0–36.0)
MCV: 81.4 fl (ref 78.0–100.0)
MONOS PCT: 8.2 % (ref 3.0–12.0)
Monocytes Absolute: 0.7 10*3/uL (ref 0.1–1.0)
NEUTROS PCT: 65.7 % (ref 43.0–77.0)
Neutro Abs: 5.5 10*3/uL (ref 1.4–7.7)
Platelets: 216 10*3/uL (ref 150.0–400.0)
RBC: 4.69 Mil/uL (ref 3.87–5.11)
RDW: 14 % (ref 11.5–15.5)
WBC: 8.4 10*3/uL (ref 4.0–10.5)

## 2015-03-26 LAB — LIPID PANEL
Cholesterol: 150 mg/dL (ref 0–200)
HDL: 67.6 mg/dL (ref >39.00)
LDL Cholesterol: 56 mg/dL (ref 0–99)
NONHDL: 82.4
Total CHOL/HDL Ratio: 2
Triglycerides: 132 mg/dL (ref 0.0–149.0)
VLDL: 26.4 mg/dL (ref 0.0–40.0)

## 2015-03-26 LAB — BASIC METABOLIC PANEL
BUN: 23 mg/dL (ref 6–23)
CHLORIDE: 100 meq/L (ref 96–112)
CO2: 36 mEq/L — ABNORMAL HIGH (ref 19–32)
Calcium: 9.7 mg/dL (ref 8.4–10.5)
Creatinine, Ser: 0.94 mg/dL (ref 0.40–1.20)
GFR: 59.83 mL/min — ABNORMAL LOW (ref >60.00)
GLUCOSE: 97 mg/dL (ref 70–99)
POTASSIUM: 4.2 meq/L (ref 3.5–5.1)
Sodium: 141 mEq/L (ref 135–145)

## 2015-03-26 LAB — T4, FREE: Free T4: 1.47 ng/dL (ref 0.60–1.60)

## 2015-03-26 NOTE — Progress Notes (Signed)
Cardiology Office Note   Date:  03/26/2015   ID:  ZSAZSA BAHENA, DOB 1926-03-14, MRN [id number]  PCP:  No PCP Per Patient  Cardiologist: Darlin Coco MD  No chief complaint on file.     History of Present Illness: [name] is a 81 y.o. female who presents for a scheduled follow-up office visit  This pleasant 81 year old woman is seen for a followup office visit. She has a history of known ischemic heart disease. She had coronary artery bypass graft surgery in 1989. She had redo CABG in October 2000. He is a past history of paroxysmal atrial fibrillation. She is not on Coumadin because of a fall risk. She is on aspirin and Plavix because of her ischemic heart disease. She has a history of hypercholesterolemia, anxiety, insomnia, and she is legally blind. . She had a more recent echocardiogram on 04/05/13 in [facility] showing an ejection fraction 50% with mild to moderate mitral regurgitation, mild aortic insufficiency, no aortic stenosis, and pulmonary artery pressure of 50. 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 Her amiodarone was stopped four months ago. The patient is now on Synthroid. She has felt better over the past month. The patient was recently seen in the emergency room with chest and epigastric pain and ruled out for an MI. Since last visit she has had no new cardiac events. She has occasional chest pain for which he takes sublingual nitroglycerin. Since last visit she was found to have a cancer of the left breast.  This is being treated with anastrozole 1 mg daily She states that she feels better now than she has a long time. This may be beneficial effect of treating her hypothyroidism. At her last visit the patient was dealing with situational depression.  She appears to be less depressed today.  She is still living by herself.  Her daughter looks after her frequently.  Past Medical History  Diagnosis Date  .  Stroke   . Coronary artery disease     remote CABG in 1989 and redo in 2007; has residual stable angina  . Hypertension   . Hyperlipidemia   . PAF (paroxysmal atrial fibrillation)   . High risk medication use     on amiodarone  . Systolic and diastolic CHF, chronic     EF 45% per echo in 2012  . Anemia   . Depression   . Anxiety   . Macular degeneration   . Chronic anxiety   . Hypothyroidism 05/29/2012    Past Surgical History  Procedure Laterality Date  . Coronary artery bypass graft      1989 with redo 2007  . Abdominal hysterectomy    . Cardiac surgery       Current Outpatient Prescriptions  Medication Sig Dispense Refill  . ALPRAZolam (XANAX) 0.25 MG tablet Take 0.25 mg by mouth 3 (three) times daily as needed for anxiety (for anxiety).    Marland Kitchen anastrozole (ARIMIDEX) 1 MG tablet Take 1 mg by mouth daily.    Marland Kitchen aspirin EC 81 MG tablet Take 81 mg by mouth daily.    Marland Kitchen atorvastatin (LIPITOR) 20 MG tablet Take 1 tablet (20 mg total) by mouth daily. 90 tablet 3  . clopidogrel (PLAVIX) 75 MG tablet TAKE 1 TABLET BY MOUTH EVERY DAY 90 tablet 3  . diltiazem (CARDIZEM CD) 180 MG 24 hr capsule Take 1 capsule (180 mg total) by mouth daily. 90 capsule 3  .  docusate sodium (COLACE) 50 MG capsule Take 50 mg by mouth as directed. 2 daily    . ferrous gluconate (FERGON) 240 (27 FE) MG tablet Take 240 mg by mouth daily.    . furosemide (LASIX) 40 MG tablet TAKE 1 AND 1/2 TABLETS BY MOUTH DAILY 135 tablet 3  . isosorbide mononitrate (IMDUR) 60 MG 24 hr tablet TAKE 1/2 TABLET BY MOUTH AT BEDTIME 30 tablet 6  . levothyroxine (SYNTHROID, LEVOTHROID) 50 MCG tablet TAKE 1 TABLET BY MOUTH EVERY DAY 90 tablet 3  . loratadine (CLARITIN) 10 MG tablet Take 10 mg by mouth daily as needed for allergies (for allergies).    . metoprolol succinate (TOPROL-XL) 25 MG 24 hr tablet Take 1 tablet (25 mg total) by mouth daily. 90 tablet 3  . nitroGLYCERIN (NITROSTAT) 0.4 MG SL tablet DISSOLVE 1 TABLET UNDER TONGUE  EVERY 5 MINUTES AS DIRECTED AS NEEDED FOR CHEST PAIN 25 tablet 3  . pantoprazole (PROTONIX) 40 MG tablet Take 1 tablet (40 mg total) by mouth 2 (two) times daily. 180 tablet 3  . potassium chloride SA (K-DUR,KLOR-CON) 20 MEQ tablet Take 1 tablet (20 mEq total) by mouth 2 (two) times daily. 180 tablet 3  . temazepam (RESTORIL) 30 MG capsule TAKE ONE CAPSULE AT BEDTIME AS NEEDED 30 capsule 5   No current facility-administered medications for this visit.    Allergies:   Amiodarone    Social History:  The patient  reports that she has quit smoking. She does not have any smokeless tobacco history on file. She reports that she does not drink alcohol or use illicit drugs.   Family History:  The patient's family history includes Aneurysm in her mother; Heart disease in her mother.    ROS:  Please see the history of present illness.   Otherwise, review of systems are positive for none.   All other systems are reviewed and negative.    PHYSICAL EXAM: VS:  BP 124/58 mmHg  Pulse 77  Ht 5\' 3"  (1.6 m)  Wt 107 lb 12.8 oz (48.898 kg)  BMI 19.10 kg/m2 , BMI Body mass index is 19.1 kg/(m^2). GEN: Well nourished, well developed, in no acute distress HEENT: normal Neck: no JVD, carotid bruits, or masses Cardiac: RRR; no murmurs, rubs, or gallops,no edema  Respiratory:  clear to auscultation bilaterally, normal work of breathing GI: soft, nontender, nondistended, + BS MS: no deformity or atrophy Skin: warm and dry, no rash Neuro:  Strength and sensation are intact Psych: euthymic mood, full affect   EKG:  EKG is ordered today. The ekg ordered today demonstrates normal sinus rhythm.  Old anteroseptal infarction.  Since last tracing of 10/18/14, no significant change.   Recent Labs: 10/18/2014: ALT 7; BUN 30*; Creatinine 1.2; Hemoglobin 12.1; Platelets 200.0; Potassium 4.3; Sodium 141 10/22/2014: TSH 1.210    Lipid Panel    Component Value Date/Time   CHOL 156 04/12/2014 1200   TRIG 125.0  04/12/2014 1200   HDL 66.70 04/12/2014 1200   CHOLHDL 2 04/12/2014 1200   VLDL 25.0 04/12/2014 1200   LDLCALC 64 04/12/2014 1200      Wt Readings from Last 3 Encounters:  03/26/15 107 lb 12.8 oz (48.898 kg)  10/18/14 106 lb (48.081 kg)  04/12/14 107 lb (48.535 kg)        ASSESSMENT AND PLAN: 1. ischemic heart disease status post CABG in 1989 and redo CABG in 2000 2. paroxysmal atrial fibrillation not on Coumadin because of fall risk.  He is  maintaining normal sinus rhythm off amiodarone 3. grade 2 diastolic dysfunction 4. hypothyroidism developing while on previous amiodarone 5.  Breast cancer of left breast being followed by Dr. Tressie Stalker.  Plan: We are checking lab work today. Recheck in 5 months for followup office visit and EKG   Current medicines are reviewed at length with the patient today.  The patient does not have concerns regarding medicines.  The following changes have been made:  no change  Labs/ tests ordered today include:   Orders Placed This Encounter  Procedures  . Lipid panel  . Hepatic function panel  . Basic metabolic panel  . CBC with Differential/Platelet  . TSH  . T4, free  . EKG 12-Lead      SignedDarlin Coco MD 03/26/2015 12:58 PM    [facility] [place], [place], [place]  [address] Phone: [phone]; Fax: [phone]

## 2015-03-26 NOTE — Progress Notes (Signed)
Quick Note:  Please report to patient. The recent labs are stable. Continue same medication and careful diet. Continue same dose of thyroid hormone. ______

## 2015-03-26 NOTE — Patient Instructions (Signed)
Medication Instructions:  Your physician recommends that you continue on your current medications as directed. Please refer to the Current Medication list given to you today.  Labwork: Lp/bmet/hfp/cbc/t4/tsh  Testing/Procedures: none  Follow-Up: Your physician wants you to follow-up in: 5 month ov/ekg  You will receive a reminder letter in the mail two months in advance. If you don't receive a letter, please call our office to schedule the follow-up appointment.

## 2015-05-16 ENCOUNTER — Encounter (HOSPITAL_COMMUNITY): Payer: Self-pay | Admitting: Emergency Medicine

## 2015-05-16 ENCOUNTER — Observation Stay (HOSPITAL_COMMUNITY)
Admission: EM | Admit: 2015-05-16 | Discharge: 2015-05-18 | Disposition: A | Discharge status: Home / Self Care | Payer: Medicare Other | Attending: Family Medicine | Admitting: Family Medicine

## 2015-05-16 ENCOUNTER — Emergency Department (HOSPITAL_COMMUNITY): Payer: Medicare Other

## 2015-05-16 DIAGNOSIS — I5042 Chronic combined systolic (congestive) and diastolic (congestive) heart failure: Secondary | ICD-10-CM | POA: Insufficient documentation

## 2015-05-16 DIAGNOSIS — Z951 Presence of aortocoronary bypass graft: Secondary | ICD-10-CM | POA: Diagnosis not present

## 2015-05-16 DIAGNOSIS — E039 Hypothyroidism, unspecified: Secondary | ICD-10-CM | POA: Insufficient documentation

## 2015-05-16 DIAGNOSIS — R110 Nausea: Secondary | ICD-10-CM | POA: Insufficient documentation

## 2015-05-16 DIAGNOSIS — R079 Chest pain, unspecified: Secondary | ICD-10-CM | POA: Diagnosis not present

## 2015-05-16 DIAGNOSIS — I2510 Atherosclerotic heart disease of native coronary artery without angina pectoris: Secondary | ICD-10-CM | POA: Insufficient documentation

## 2015-05-16 DIAGNOSIS — I480 Paroxysmal atrial fibrillation: Secondary | ICD-10-CM | POA: Diagnosis present

## 2015-05-16 DIAGNOSIS — F419 Anxiety disorder, unspecified: Secondary | ICD-10-CM | POA: Insufficient documentation

## 2015-05-16 DIAGNOSIS — Z7982 Long term (current) use of aspirin: Secondary | ICD-10-CM | POA: Insufficient documentation

## 2015-05-16 DIAGNOSIS — F329 Major depressive disorder, single episode, unspecified: Secondary | ICD-10-CM | POA: Insufficient documentation

## 2015-05-16 DIAGNOSIS — I255 Ischemic cardiomyopathy: Secondary | ICD-10-CM | POA: Diagnosis not present

## 2015-05-16 DIAGNOSIS — Z79899 Other long term (current) drug therapy: Secondary | ICD-10-CM | POA: Insufficient documentation

## 2015-05-16 DIAGNOSIS — Z7902 Long term (current) use of antithrombotics/antiplatelets: Secondary | ICD-10-CM | POA: Diagnosis not present

## 2015-05-16 DIAGNOSIS — D649 Anemia, unspecified: Secondary | ICD-10-CM | POA: Diagnosis not present

## 2015-05-16 DIAGNOSIS — E785 Hyperlipidemia, unspecified: Secondary | ICD-10-CM | POA: Insufficient documentation

## 2015-05-16 DIAGNOSIS — H3530 Unspecified macular degeneration: Secondary | ICD-10-CM | POA: Insufficient documentation

## 2015-05-16 DIAGNOSIS — I10 Essential (primary) hypertension: Secondary | ICD-10-CM | POA: Insufficient documentation

## 2015-05-16 DIAGNOSIS — Z87891 Personal history of nicotine dependence: Secondary | ICD-10-CM | POA: Insufficient documentation

## 2015-05-16 DIAGNOSIS — Z8673 Personal history of transient ischemic attack (TIA), and cerebral infarction without residual deficits: Secondary | ICD-10-CM | POA: Insufficient documentation

## 2015-05-16 LAB — CBC
HCT: 39.4 % (ref 36.0–46.0)
Hemoglobin: 12.9 g/dL (ref 12.0–15.0)
MCH: 27.9 pg (ref 26.0–34.0)
MCHC: 32.7 g/dL (ref 30.0–36.0)
MCV: 85.1 fL (ref 78.0–100.0)
PLATELETS: 152 10*3/uL (ref 150–400)
RBC: 4.63 MIL/uL (ref 3.87–5.11)
RDW: 13.5 % (ref 11.5–15.5)
WBC: 8.3 10*3/uL (ref 4.0–10.5)

## 2015-05-16 LAB — BASIC METABOLIC PANEL
ANION GAP: 9 (ref 5–15)
BUN: 25 mg/dL — ABNORMAL HIGH (ref 6–20)
CALCIUM: 8.5 mg/dL — AB (ref 8.9–10.3)
CO2: 32 mmol/L (ref 22–32)
Chloride: 101 mmol/L (ref 101–111)
Creatinine, Ser: 1 mg/dL (ref 0.44–1.00)
GFR calc non Af Amer: 49 mL/min — ABNORMAL LOW (ref >60)
GFR, EST AFRICAN AMERICAN: 57 mL/min — AB (ref >60)
Glucose, Bld: 147 mg/dL — ABNORMAL HIGH (ref 65–99)
POTASSIUM: 3.3 mmol/L — AB (ref 3.5–5.1)
SODIUM: 142 mmol/L (ref 135–145)

## 2015-05-16 LAB — PROTIME-INR
INR: 0.96 (ref 0.00–1.49)
Prothrombin Time: 13 seconds (ref 11.6–15.2)

## 2015-05-16 LAB — TROPONIN I: Troponin I: <0.03 ng/mL (ref <0.031)

## 2015-05-16 LAB — BRAIN NATRIURETIC PEPTIDE: B NATRIURETIC PEPTIDE 5: 252 pg/mL — AB (ref 0.0–100.0)

## 2015-05-16 MED ORDER — PANTOPRAZOLE SODIUM 40 MG PO TBEC
40.0000 mg | DELAYED_RELEASE_TABLET | Freq: Two times a day (BID) | ORAL | Status: DC
  Administered 2015-05-17 – 2015-05-18 (×3): 40 mg via ORAL
  Filled 2015-05-16 (×3): qty 1

## 2015-05-16 MED ORDER — METOPROLOL SUCCINATE ER 25 MG PO TB24
25.0000 mg | ORAL_TABLET | Freq: Every day | ORAL | Status: DC
  Administered 2015-05-17 – 2015-05-18 (×2): 25 mg via ORAL
  Filled 2015-05-16 (×2): qty 1

## 2015-05-16 MED ORDER — ALPRAZOLAM 0.25 MG PO TABS
0.2500 mg | ORAL_TABLET | Freq: Three times a day (TID) | ORAL | Status: DC | PRN
  Administered 2015-05-16 – 2015-05-17 (×3): 0.25 mg via ORAL
  Filled 2015-05-16 (×3): qty 1

## 2015-05-16 MED ORDER — DOCUSATE SODIUM 50 MG PO CAPS: 50.0000 mg | ORAL_CAPSULE | ORAL | Status: DC

## 2015-05-16 MED ORDER — ANASTROZOLE 1 MG PO TABS
1.0000 mg | ORAL_TABLET | Freq: Every day | ORAL | Status: DC
  Administered 2015-05-17 – 2015-05-18 (×2): 1 mg via ORAL
  Filled 2015-05-16 (×5): qty 1

## 2015-05-16 MED ORDER — FUROSEMIDE 40 MG PO TABS
60.0000 mg | ORAL_TABLET | Freq: Every day | ORAL | Status: DC
  Administered 2015-05-17 – 2015-05-18 (×2): 60 mg via ORAL
  Filled 2015-05-16 (×4): qty 1

## 2015-05-16 MED ORDER — ISOSORBIDE MONONITRATE ER 30 MG PO TB24
30.0000 mg | ORAL_TABLET | Freq: Every day | ORAL | Status: DC
  Administered 2015-05-16 – 2015-05-17 (×2): 30 mg via ORAL
  Filled 2015-05-16 (×2): qty 1

## 2015-05-16 MED ORDER — FERROUS GLUCONATE 324 (38 FE) MG PO TABS
324.0000 mg | ORAL_TABLET | Freq: Every day | ORAL | Status: DC
  Administered 2015-05-18: 324 mg via ORAL
  Filled 2015-05-16 (×6): qty 1

## 2015-05-16 MED ORDER — ASPIRIN 81 MG PO CHEW
324.0000 mg | CHEWABLE_TABLET | Freq: Once | ORAL | Status: AC
  Administered 2015-05-16: 324 mg via ORAL
  Filled 2015-05-16: qty 4

## 2015-05-16 MED ORDER — DILTIAZEM HCL ER COATED BEADS 180 MG PO CP24
180.0000 mg | ORAL_CAPSULE | Freq: Every day | ORAL | Status: DC
  Administered 2015-05-17 – 2015-05-18 (×2): 180 mg via ORAL
  Filled 2015-05-16 (×2): qty 1

## 2015-05-16 MED ORDER — ACETAMINOPHEN 325 MG PO TABS
650.0000 mg | ORAL_TABLET | ORAL | Status: DC | PRN
Start: 2015-05-16 — End: 2015-05-18
  Administered 2015-05-17: 650 mg via ORAL
  Filled 2015-05-16: qty 2

## 2015-05-16 MED ORDER — TEMAZEPAM 15 MG PO CAPS
15.0000 mg | ORAL_CAPSULE | Freq: Every evening | ORAL | Status: DC | PRN
  Administered 2015-05-16 – 2015-05-17 (×2): 15 mg via ORAL
  Filled 2015-05-16 (×2): qty 1

## 2015-05-16 MED ORDER — LEVOTHYROXINE SODIUM 50 MCG PO TABS
50.0000 ug | ORAL_TABLET | Freq: Every day | ORAL | Status: DC
  Administered 2015-05-17 – 2015-05-18 (×2): 50 ug via ORAL
  Filled 2015-05-16 (×2): qty 1

## 2015-05-16 MED ORDER — DOCUSATE SODIUM 100 MG PO CAPS
100.0000 mg | ORAL_CAPSULE | Freq: Every day | ORAL | Status: DC
  Administered 2015-05-17 – 2015-05-18 (×2): 100 mg via ORAL
  Filled 2015-05-16 (×2): qty 1

## 2015-05-16 MED ORDER — POTASSIUM CHLORIDE 20 MEQ PO PACK
40.0000 meq | PACK | Freq: Once | ORAL | Status: AC
  Administered 2015-05-16: 40 meq via ORAL
  Filled 2015-05-16: qty 2

## 2015-05-16 MED ORDER — NITROGLYCERIN 0.4 MG SL SUBL: 0.4000 mg | SUBLINGUAL_TABLET | SUBLINGUAL | Status: DC | PRN

## 2015-05-16 MED ORDER — ASPIRIN EC 81 MG PO TBEC
81.0000 mg | DELAYED_RELEASE_TABLET | Freq: Every day | ORAL | Status: DC
  Administered 2015-05-17 – 2015-05-18 (×2): 81 mg via ORAL
  Filled 2015-05-16 (×2): qty 1

## 2015-05-16 MED ORDER — ONDANSETRON HCL 4 MG/2ML IJ SOLN: 4.0000 mg | Freq: Four times a day (QID) | INTRAMUSCULAR | Status: DC | PRN

## 2015-05-16 MED ORDER — CLOPIDOGREL BISULFATE 75 MG PO TABS
75.0000 mg | ORAL_TABLET | Freq: Every day | ORAL | Status: DC
  Administered 2015-05-17 – 2015-05-18 (×2): 75 mg via ORAL
  Filled 2015-05-16 (×2): qty 1

## 2015-05-16 MED ORDER — ENOXAPARIN SODIUM 40 MG/0.4ML ~~LOC~~ SOLN
40.0000 mg | SUBCUTANEOUS | Status: DC
  Administered 2015-05-16 – 2015-05-17 (×2): 40 mg via SUBCUTANEOUS
  Filled 2015-05-16 (×2): qty 0.4

## 2015-05-16 MED ORDER — POTASSIUM CHLORIDE CRYS ER 20 MEQ PO TBCR
20.0000 meq | EXTENDED_RELEASE_TABLET | Freq: Two times a day (BID) | ORAL | Status: DC
  Administered 2015-05-16 – 2015-05-18 (×4): 20 meq via ORAL
  Filled 2015-05-16 (×4): qty 1

## 2015-05-16 MED ORDER — LORATADINE 10 MG PO TABS: 10.0000 mg | ORAL_TABLET | Freq: Every day | ORAL | Status: DC | PRN

## 2015-05-16 MED ORDER — ATORVASTATIN CALCIUM 20 MG PO TABS
20.0000 mg | ORAL_TABLET | Freq: Every day | ORAL | Status: DC
  Administered 2015-05-17 – 2015-05-18 (×2): 20 mg via ORAL
  Filled 2015-05-16 (×2): qty 1

## 2015-05-16 NOTE — H&P (Signed)
[facility] History and Physical  [name] WYO:[id number] DOB: 24-Nov-1925 DOA: 05/16/2015  Referring physician: ER PCP: No PCP Per Patient   Chief Complaint: Left-sided chest pain  HPI: [name] is a 81 y.o. female  This is an 82 year old lady who has a history of coronary artery disease, with CABG and then redo CABG and who also has a history of paroxysmal atrial fibrillation, now presents with left-sided chest pain which started yesterday evening. Apparently it has been intermittent throughout today and she describes it as a soreness. She denies any dyspnea, sweating, nausea with it. It does not seem to radiate. She denies any abdominal pain, vomiting, hemoptysis. She is now being admitted for further investigation.   Review of Systems:  Apart from symptoms above, all systems negative.  Past Medical History  Diagnosis Date  . Stroke   . Coronary artery disease     remote CABG in 1989 and redo in 2007; has residual stable angina  . Hypertension   . Hyperlipidemia   . PAF (paroxysmal atrial fibrillation)   . High risk medication use     on amiodarone  . Systolic and diastolic CHF, chronic     EF 45% per echo in 2012  . Anemia   . Depression   . Anxiety   . Macular degeneration   . Chronic anxiety   . Hypothyroidism 05/29/2012   Past Surgical History  Procedure Laterality Date  . Coronary artery bypass graft      1989 with redo 2007  . Abdominal hysterectomy    . Cardiac surgery     Social History:  reports that she has quit smoking. She does not have any smokeless tobacco history on file. She reports that she does not drink alcohol or use illicit drugs.  Allergies  Allergen Reactions  . Amiodarone     Hypothyroidism    Family History  Problem Relation Age of Onset  . Heart disease Mother   . Aneurysm Mother       Prior to Admission medications   Medication Sig Start Date End Date Taking? Authorizing Provider  ALPRAZolam (XANAX) 0.25 MG  tablet Take 0.25 mg by mouth 3 (three) times daily as needed for anxiety (for anxiety).    Historical Provider, MD  anastrozole (ARIMIDEX) 1 MG tablet Take 1 mg by mouth daily. 11/20/14 11/20/15  Historical Provider, MD  aspirin EC 81 MG tablet Take 81 mg by mouth daily.    Historical Provider, MD  atorvastatin (LIPITOR) 20 MG tablet Take 1 tablet (20 mg total) by mouth daily. 10/18/14   Darlin Coco, MD  clopidogrel (PLAVIX) 75 MG tablet TAKE 1 TABLET BY MOUTH EVERY DAY 10/18/14   Darlin Coco, MD  diltiazem (CARDIZEM CD) 180 MG 24 hr capsule Take 1 capsule (180 mg total) by mouth daily. 10/18/14   Darlin Coco, MD  docusate sodium (COLACE) 50 MG capsule Take 50 mg by mouth as directed. 2 daily    Historical Provider, MD  ferrous gluconate (FERGON) 240 (27 FE) MG tablet Take 240 mg by mouth daily.    Historical Provider, MD  furosemide (LASIX) 40 MG tablet TAKE 1 AND 1/2 TABLETS BY MOUTH DAILY 10/18/14   Darlin Coco, MD  isosorbide mononitrate (IMDUR) 60 MG 24 hr tablet TAKE 1/2 TABLET BY MOUTH AT BEDTIME 04/25/14   Darlin Coco, MD  levothyroxine (SYNTHROID, LEVOTHROID) 50 MCG tablet TAKE 1 TABLET BY MOUTH EVERY DAY 10/18/14   Darlin Coco, MD  loratadine (CLARITIN) 10 MG  tablet Take 10 mg by mouth daily as needed for allergies (for allergies).    Historical Provider, MD  metoprolol succinate (TOPROL-XL) 25 MG 24 hr tablet Take 1 tablet (25 mg total) by mouth daily. 10/18/14   Darlin Coco, MD  nitroGLYCERIN (NITROSTAT) 0.4 MG SL tablet DISSOLVE 1 TABLET UNDER TONGUE EVERY 5 MINUTES AS DIRECTED AS NEEDED FOR CHEST PAIN 10/18/14   Darlin Coco, MD  pantoprazole (PROTONIX) 40 MG tablet Take 1 tablet (40 mg total) by mouth 2 (two) times daily. 10/18/14   Darlin Coco, MD  potassium chloride SA (K-DUR,KLOR-CON) 20 MEQ tablet Take 1 tablet (20 mEq total) by mouth 2 (two) times daily. 10/18/14   Darlin Coco, MD  temazepam (RESTORIL) 30 MG capsule TAKE ONE CAPSULE AT  BEDTIME AS NEEDED 11/28/14   Darlin Coco, MD   Physical Exam: Filed Vitals:   05/16/15 1828 05/16/15 1928  BP: 120/57 118/83  Pulse: 97 109  Temp: 98 F (36.7 C) 98.1 F (36.7 C)  TempSrc: Oral Oral  Resp: 20 18  Height: 5\' 3"  (1.6 m)   Weight: 48.535 kg (107 lb)   SpO2: 97% 97%    Wt Readings from Last 3 Encounters:  05/16/15 48.535 kg (107 lb)  03/26/15 48.898 kg (107 lb 12.8 oz)  10/18/14 48.081 kg (106 lb)    General:  Appears calm and comfortable. She does not appear to be in pain at the present time. Eyes: PERRL, normal lids, irises & conjunctiva ENT: grossly normal hearing, lips & tongue Neck: no LAD, masses or thyromegaly Cardiovascular: Irregularly irregular, consistent with atrial fibrillation. Telemetry: Atrial fibrillation. Rate is controlled. Respiratory: CTA bilaterally, no w/r/r. Normal respiratory effort. Abdomen: soft, ntnd Skin: no rash or induration seen on limited exam Musculoskeletal: She does have anterior chest wall tenderness, although this is bilateral and it is not clear to me whether this reproduces her pain. Psychiatric: grossly normal mood and affect, speech fluent and appropriate Neurologic: grossly non-focal.          Labs on Admission:  Basic Metabolic Panel:  Recent Labs Lab 05/16/15 1934  NA 142  K 3.3*  CL 101  CO2 32  GLUCOSE 147*  BUN 25*  CREATININE 1.00  CALCIUM 8.5*   Liver Function Tests: No results for input(s): AST, ALT, ALKPHOS, BILITOT, PROT, ALBUMIN in the last 168 hours. No results for input(s): LIPASE, AMYLASE in the last 168 hours. No results for input(s): AMMONIA in the last 168 hours. CBC:  Recent Labs Lab 05/16/15 1934  WBC 8.3  HGB 12.9  HCT 39.4  MCV 85.1  PLT 152   Cardiac Enzymes:  Recent Labs Lab 05/16/15 1934  TROPONINI <0.03    BNP (last 3 results)  Recent Labs  05/16/15 1934  BNP 252.0*    ProBNP (last 3 results) No results for input(s): PROBNP in the last 8760  hours.  CBG: No results for input(s): GLUCAP in the last 168 hours.  Radiological Exams on Admission: Dg Chest 2 View  05/16/2015   CLINICAL DATA:  Left-sided chest pain for 4 hours, initial encounter  EXAM: CHEST - 2 VIEW  COMPARISON:  04/26/2013  FINDINGS: Cardiac shadow is within normal limits. Postsurgical changes are again seen. The overall inspiratory effort is poor with crowding of the vascular markings. Mild vascular congestion and interstitial changes are seen. No sizable effusion is noted. No focal infiltrate noted.  IMPRESSION: Mild vascular congestion accentuated by the poor inspiratory effort.   Electronically Signed   By: Elta Guadeloupe  Lukens M.D.   On: 05/16/2015 19:02    EKG: Independently reviewed. Atrial fibrillation without any acute ST elevations.  Assessment/Plan   1. Left-sided chest pain. The etiology is not clear. I wonder if it represents also atrial fibrillation since she has a history of paroxysmal atrial fibrillation. Thankfully, the ventricular rate is not significantly rapid. Cycle cardiac enzymes. Cardiology consultation in the morning. 2. Ischemic cardiac myopathy.  3. Paroxysmal atrial fibrillation. She is not on chronic anticoagulation therapy because of a fall risk. 4. Anxiety.  Further recommendations will depend on patient's hospital progress.   Code Status: Full code.   DVT Prophylaxis: Lovenox.  Family Communication: I discussed the plan with the patient at the bedside.   Disposition Plan: Home when medically stable.   Time spent: 45 minutes.  Doree Albee [facility] Pager [phone].

## 2015-05-16 NOTE — ED Notes (Signed)
Patient ambulated to restroom with assist, tolerated well 

## 2015-05-16 NOTE — ED Provider Notes (Signed)
CSN: [id number]     Arrival date & time 05/16/15  1820 History   First MD Initiated Contact with Patient 05/16/15 1832     Chief Complaint  Patient presents with  . Chest Pain     (Consider location/radiation/quality/duration/timing/severity/associated sxs/prior Treatment) Patient is a 81 y.o. female presenting with chest pain.  Chest Pain Pain location:  L chest Pain quality comment:  "soreness", unable to specify Pain radiates to:  Does not radiate Pain radiates to the back: no   Pain severity:  Severe Onset quality:  Sudden Duration:  4 hours Timing:  Constant Progression:  Improving Chronicity:  New Relieved by:  Nothing Worsened by:  Nothing tried Ineffective treatments:  Nitroglycerin Associated symptoms: nausea   Associated symptoms: no abdominal pain, no back pain, no cough, no diaphoresis, no fever, no headache, no numbness, no shortness of breath and not vomiting   Risk factors: coronary artery disease, high cholesterol and hypertension   Risk factors: no immobilization, no prior DVT/PE and no smoking     Past Medical History  Diagnosis Date  . Stroke   . Coronary artery disease     remote CABG in 1989 and redo in 2007; has residual stable angina  . Hypertension   . Hyperlipidemia   . PAF (paroxysmal atrial fibrillation)   . High risk medication use     on amiodarone  . Systolic and diastolic CHF, chronic     EF 45% per echo in 2012  . Anemia   . Depression   . Anxiety   . Macular degeneration   . Chronic anxiety   . Hypothyroidism 05/29/2012   Past Surgical History  Procedure Laterality Date  . Coronary artery bypass graft      1989 with redo 2007 1. Redo median sternotomy, extracorporeal circulation, redo coronary  . Abdominal hysterectomy    . Cardiac surgery     Family History  Problem Relation Age of Onset  . Heart disease Mother   . Aneurysm Mother    History  Substance Use Topics  . Smoking status: Former Research scientist (life sciences)  . Smokeless tobacco:  Not on file  . Alcohol Use: No   OB History    No data available     Review of Systems  Constitutional: Negative for fever and diaphoresis.  HENT: Negative for sore throat.   Eyes: Negative for visual disturbance.  Respiratory: Negative for cough and shortness of breath.   Cardiovascular: Positive for chest pain.  Gastrointestinal: Positive for nausea. Negative for vomiting and abdominal pain.  Genitourinary: Negative for difficulty urinating.  Musculoskeletal: Negative for back pain and neck pain.  Skin: Negative for rash.  Neurological: Negative for syncope, numbness and headaches.      Allergies  Amiodarone  Home Medications   Prior to Admission medications   Medication Sig Start Date End Date Taking? Authorizing Provider  atorvastatin (LIPITOR) 20 MG tablet Take 1 tablet (20 mg total) by mouth daily. 10/18/14  Yes Darlin Coco, MD  clopidogrel (PLAVIX) 75 MG tablet TAKE 1 TABLET BY MOUTH EVERY DAY Patient taking differently: Take 75 mg by mouth daily.  10/18/14  Yes Darlin Coco, MD  diltiazem (CARDIZEM CD) 180 MG 24 hr capsule Take 1 capsule (180 mg total) by mouth daily. 10/18/14  Yes Darlin Coco, MD  furosemide (LASIX) 40 MG tablet TAKE 1 AND 1/2 TABLETS BY MOUTH DAILY Patient taking differently: Take 60 mg by mouth daily.  10/18/14  Yes Darlin Coco, MD  isosorbide mononitrate (IMDUR) 60 MG  24 hr tablet TAKE 1/2 TABLET BY MOUTH AT BEDTIME Patient taking differently: Take 30 mg by mouth at bedtime.  04/25/14  Yes Darlin Coco, MD  nitroGLYCERIN (NITROSTAT) 0.4 MG SL tablet DISSOLVE 1 TABLET UNDER TONGUE EVERY 5 MINUTES AS DIRECTED AS NEEDED FOR CHEST PAIN Patient taking differently: Place 0.4 mg under the tongue every 5 (five) minutes as needed.  10/18/14  Yes Darlin Coco, MD  pantoprazole (PROTONIX) 40 MG tablet Take 1 tablet (40 mg total) by mouth 2 (two) times daily. 10/18/14  Yes Darlin Coco, MD  potassium chloride SA (K-DUR,KLOR-CON)  20 MEQ tablet Take 1 tablet (20 mEq total) by mouth 2 (two) times daily. 10/18/14  Yes Darlin Coco, MD  ALPRAZolam Duanne Moron) 0.25 MG tablet Take 0.25 mg by mouth 3 (three) times daily as needed for anxiety (for anxiety).    Historical Provider, MD  anastrozole (ARIMIDEX) 1 MG tablet Take 1 mg by mouth daily. 11/20/14 11/20/15  Historical Provider, MD  aspirin EC 81 MG tablet Take 81 mg by mouth daily.    Historical Provider, MD  docusate sodium (COLACE) 50 MG capsule Take 50 mg by mouth as directed. 2 daily    Historical Provider, MD  ferrous gluconate (FERGON) 240 (27 FE) MG tablet Take 240 mg by mouth daily.    Historical Provider, MD  levothyroxine (SYNTHROID, LEVOTHROID) 50 MCG tablet TAKE 1 TABLET BY MOUTH EVERY DAY 10/18/14   Darlin Coco, MD  loratadine (CLARITIN) 10 MG tablet Take 10 mg by mouth daily as needed for allergies (for allergies).    Historical Provider, MD  metoprolol succinate (TOPROL-XL) 25 MG 24 hr tablet Take 1 tablet (25 mg total) by mouth daily. 10/18/14   Darlin Coco, MD  temazepam (RESTORIL) 30 MG capsule TAKE ONE CAPSULE AT BEDTIME AS NEEDED 11/28/14   Darlin Coco, MD   BP 132/57 mmHg  Pulse 66  Temp(Src) 98.2 F (36.8 C) (Oral)  Resp 18  Ht 5\' 3"  (1.6 m)  Wt 107 lb 8 oz (48.762 kg)  BMI 19.05 kg/m2  SpO2 98% Physical Exam  Constitutional: She is oriented to person, place, and time. She appears well-developed and well-nourished. No distress.  HENT:  Head: Normocephalic and atraumatic.  Eyes: Conjunctivae and EOM are normal.  Neck: Normal range of motion.  Cardiovascular: Normal heart sounds and intact distal pulses.  An irregularly irregular rhythm present. Tachycardia present.  Exam reveals no gallop and no friction rub.   No murmur heard. Pulmonary/Chest: Effort normal and breath sounds normal. No respiratory distress. She has no wheezes. She has no rales.  Abdominal: Soft. She exhibits no distension. There is no tenderness. There is no  guarding.  Musculoskeletal: She exhibits no edema or tenderness.  Neurological: She is alert and oriented to person, place, and time.  Skin: Skin is warm and dry. No rash noted. She is not diaphoretic. No erythema.  Nursing note and vitals reviewed.   ED Course  Procedures (including critical care time) Labs Review Labs Reviewed  BASIC METABOLIC PANEL - Abnormal; Notable for the following:    Potassium 3.3 (*)    Glucose, Bld 147 (*)    BUN 25 (*)    Calcium 8.5 (*)    GFR calc non Af Amer 49 (*)    GFR calc Af Amer 57 (*)    All other components within normal limits  BRAIN NATRIURETIC PEPTIDE - Abnormal; Notable for the following:    B Natriuretic Peptide 252.0 (*)    All other  components within normal limits  CBC  TROPONIN I  PROTIME-INR  TROPONIN I  TROPONIN I  TROPONIN I  I-STAT TROPOININ, ED    Imaging Review Dg Chest 2 View  05/16/2015   CLINICAL DATA:  Left-sided chest pain for 4 hours, initial encounter  EXAM: CHEST - 2 VIEW  COMPARISON:  04/26/2013  FINDINGS: Cardiac shadow is within normal limits. Postsurgical changes are again seen. The overall inspiratory effort is poor with crowding of the vascular markings. Mild vascular congestion and interstitial changes are seen. No sizable effusion is noted. No focal infiltrate noted.  IMPRESSION: Mild vascular congestion accentuated by the poor inspiratory effort.   Electronically Signed   By: Inez Catalina M.D.   On: 05/16/2015 19:02     EKG Interpretation   Date/Time:  Thursday May 16 2015 18:33:40 EDT Ventricular Rate:  109 PR Interval:    QRS Duration: 93 QT Interval:  342 QTC Calculation: 460 R Axis:   67 Text Interpretation:  Atrial fibrillation Anteroseptal infarct, old Repol  abnrm suggests ischemia, anterolateral ST abnormality in lateral leads  more significant than prior tracing Confirmed by [facility] MD, [place]  ([id number]) on 05/17/2015 12:09:09 AM      MDM   Final diagnoses:  Chest pain, unspecified  chest pain type   81yo female with history of paroxysmal atrial fibrillation, CAD presents with chest pain.  EKG with afib, TW abnormality lateral leads present on previous.  Troponin negative.  Coubt PE/pneumonia/dissection by hx/physical exam and imaging.  Admitted for chest pain rule out given high risk.     Gareth Morgan, MD 05/17/15 1116

## 2015-05-16 NOTE — ED Notes (Signed)
Patient complaining of left chest pain starting approximately 1500 today. Per EMS, patient took 5 SL nitro with no relief.

## 2015-05-17 ENCOUNTER — Observation Stay (INDEPENDENT_AMBULATORY_CARE_PROVIDER_SITE_OTHER): Payer: Medicare Other

## 2015-05-17 ENCOUNTER — Encounter (HOSPITAL_COMMUNITY): Payer: Self-pay | Admitting: Adult Health

## 2015-05-17 DIAGNOSIS — Z951 Presence of aortocoronary bypass graft: Secondary | ICD-10-CM | POA: Diagnosis not present

## 2015-05-17 DIAGNOSIS — E785 Hyperlipidemia, unspecified: Secondary | ICD-10-CM | POA: Diagnosis not present

## 2015-05-17 DIAGNOSIS — R079 Chest pain, unspecified: Secondary | ICD-10-CM

## 2015-05-17 DIAGNOSIS — D649 Anemia, unspecified: Secondary | ICD-10-CM | POA: Diagnosis not present

## 2015-05-17 DIAGNOSIS — I5042 Chronic combined systolic (congestive) and diastolic (congestive) heart failure: Secondary | ICD-10-CM | POA: Diagnosis not present

## 2015-05-17 DIAGNOSIS — Z79899 Other long term (current) drug therapy: Secondary | ICD-10-CM | POA: Diagnosis not present

## 2015-05-17 DIAGNOSIS — R110 Nausea: Secondary | ICD-10-CM | POA: Diagnosis not present

## 2015-05-17 DIAGNOSIS — I480 Paroxysmal atrial fibrillation: Secondary | ICD-10-CM

## 2015-05-17 DIAGNOSIS — Z7902 Long term (current) use of antithrombotics/antiplatelets: Secondary | ICD-10-CM | POA: Diagnosis not present

## 2015-05-17 DIAGNOSIS — Z7982 Long term (current) use of aspirin: Secondary | ICD-10-CM | POA: Diagnosis not present

## 2015-05-17 DIAGNOSIS — I4891 Unspecified atrial fibrillation: Secondary | ICD-10-CM | POA: Diagnosis not present

## 2015-05-17 DIAGNOSIS — I5032 Chronic diastolic (congestive) heart failure: Secondary | ICD-10-CM | POA: Diagnosis not present

## 2015-05-17 DIAGNOSIS — I2510 Atherosclerotic heart disease of native coronary artery without angina pectoris: Secondary | ICD-10-CM | POA: Diagnosis not present

## 2015-05-17 DIAGNOSIS — I255 Ischemic cardiomyopathy: Secondary | ICD-10-CM

## 2015-05-17 DIAGNOSIS — F329 Major depressive disorder, single episode, unspecified: Secondary | ICD-10-CM | POA: Diagnosis not present

## 2015-05-17 DIAGNOSIS — I259 Chronic ischemic heart disease, unspecified: Secondary | ICD-10-CM

## 2015-05-17 DIAGNOSIS — Z87891 Personal history of nicotine dependence: Secondary | ICD-10-CM | POA: Diagnosis not present

## 2015-05-17 DIAGNOSIS — H3530 Unspecified macular degeneration: Secondary | ICD-10-CM | POA: Diagnosis not present

## 2015-05-17 DIAGNOSIS — I10 Essential (primary) hypertension: Secondary | ICD-10-CM | POA: Diagnosis not present

## 2015-05-17 DIAGNOSIS — I25708 Atherosclerosis of coronary artery bypass graft(s), unspecified, with other forms of angina pectoris: Secondary | ICD-10-CM

## 2015-05-17 DIAGNOSIS — Z8673 Personal history of transient ischemic attack (TIA), and cerebral infarction without residual deficits: Secondary | ICD-10-CM | POA: Diagnosis not present

## 2015-05-17 DIAGNOSIS — E039 Hypothyroidism, unspecified: Secondary | ICD-10-CM | POA: Diagnosis not present

## 2015-05-17 DIAGNOSIS — F419 Anxiety disorder, unspecified: Secondary | ICD-10-CM | POA: Diagnosis not present

## 2015-05-17 LAB — TROPONIN I: Troponin I: <0.03 ng/mL (ref <0.031)

## 2015-05-17 NOTE — Progress Notes (Signed)
[facility] PROGRESS NOTE  [name] XTG:[id number] DOB: [birth year] DOA: 05/16/2015 PCP: No PCP Per Patient  Assessment/Plan: 1. Chest pain- patient has a history of CAD, will consult cartilage of for further recommendations. 2. CAD- continue Lidex, aspirin, Imdur, Lasix 3. Atrial fibrillation- patient is not on anticoagulation due to frequent falls. Continue diltiazem and metoprolol. 4. Social situation- patient lives by herself and has 2 nieces will take care of her. Patient was offered to go to assisted living facility, but at this time she has decided to go home with home health services.   Code Status: Full code Family Communication: *No family at bedside Disposition Plan: Home, possibly in a.m.   Consultants:  Cardiology  Procedures:  None  Antibiotics:  *None  HPI/Subjective: 81 year old lady who has a history of coronary artery disease, with CABG and then redo CABG and who also has a history of paroxysmal atrial fibrillation, now presents with left-sided chest pain which started yesterday evening. Apparently it has been intermittent throughout today and she describes it as a soreness. She denies any dyspnea, sweating, nausea with it. It does not seem to radiate.  This morning patient says that she is feeling better. Denies shortness of breath or chest pain  Objective: Filed Vitals:   05/17/15 1521  BP: 120/88  Pulse: 68  Temp: 97.8 F (36.6 C)  Resp: 20    Intake/Output Summary (Last 24 hours) at 05/17/15 1731 Last data filed at 05/17/15 1700  Gross per 24 hour  Intake    150 ml  Output    700 ml  Net   -550 ml   Filed Weights   05/16/15 1828 05/16/15 2300  Weight: 48.535 kg (107 lb) 48.762 kg (107 lb 8 oz)    Exam:   General:  Appearing no acute distress  Cardiovascular: S1-S2 regular  Respiratory: Clear to auscultation bilaterally  Abdomen: Soft, nontender, no organomegaly  Musculoskeletal: No cyanosis ,clubbing or edema of the  lower extremities   Data Reviewed: Basic Metabolic Panel:  Recent Labs Lab 05/16/15 1934  NA 142  K 3.3*  CL 101  CO2 32  GLUCOSE 147*  BUN 25*  CREATININE 1.00  CALCIUM 8.5*   CBC:  Recent Labs Lab 05/16/15 1934  WBC 8.3  HGB 12.9  HCT 39.4  MCV 85.1  PLT 152   Cardiac Enzymes:  Recent Labs Lab 05/16/15 1934 05/16/15 2122 05/17/15 0007 05/17/15 0335  TROPONINI <0.03 <0.03 <0.03 <0.03   BNP (last 3 results)  Recent Labs  05/16/15 1934  BNP 252.0*    Studies: Dg Chest 2 View  05/16/2015   CLINICAL DATA:  Left-sided chest pain for 4 hours, initial encounter  EXAM: CHEST - 2 VIEW  COMPARISON:  04/26/2013  FINDINGS: Cardiac shadow is within normal limits. Postsurgical changes are again seen. The overall inspiratory effort is poor with crowding of the vascular markings. Mild vascular congestion and interstitial changes are seen. No sizable effusion is noted. No focal infiltrate noted.  IMPRESSION: Mild vascular congestion accentuated by the poor inspiratory effort.   Electronically Signed   By: Inez Catalina M.D.   On: 05/16/2015 19:02    Scheduled Meds: . anastrozole  1 mg Oral Daily  . aspirin EC  81 mg Oral Daily  . atorvastatin  20 mg Oral Daily  . clopidogrel  75 mg Oral Daily  . diltiazem  180 mg Oral Daily  . docusate sodium  100 mg Oral Daily  . enoxaparin (LOVENOX) injection  40  mg Subcutaneous Q24H  . ferrous gluconate  324 mg Oral Daily  . furosemide  60 mg Oral Daily  . isosorbide mononitrate  30 mg Oral QHS  . levothyroxine  50 mcg Oral QAC breakfast  . metoprolol succinate  25 mg Oral Daily  . pantoprazole  40 mg Oral BID  . potassium chloride SA  20 mEq Oral BID   Continuous Infusions:   Active Problems:   Paroxysmal atrial fibrillation   Anxiety   Cardiomyopathy, ischemic   Chest pain    Time spent: 25 min    [facility] Pager [phone]. If 7PM-7AM, please contact night-coverage at [email or web address],  password [facility] 05/17/2015, 5:31 PM

## 2015-05-17 NOTE — Care Management Note (Signed)
Case Management Note  Patient Details  Name: [name] MRN: [id number] Date of Birth: January 14, 1926  Subjective/Objective:                  Pt admitted from home with CP. Pt lives alone and wants to return there at discharge. Pt has 2 nieces that assist pt with bill paying and getting groceries. Pt is very independent with ADL's.   Action/Plan: Attempting to contact pt nieces to come and transport pt home. Unable to contact anyone. Will continue to try.  Expected Discharge Date:                  Expected Discharge Plan:  Home/Self Care  In-House Referral:  Clinical Social Work  Discharge planning Services  CM Consult  Post Acute Care Choice:  NA Choice offered to:  NA  DME Arranged:    DME Agency:     HH Arranged:    HH Agency:     Status of Service:  In process, will continue to follow  Medicare Important Message Given:    Date Medicare IM Given:    Medicare IM give by:    Date Additional Medicare IM Given:    Additional Medicare Important Message give by:     If discussed at [place] of Stay Meetings, dates discussed:    Additional Comments:  Joylene Draft, RN 05/17/2015, 11:35 AM

## 2015-05-17 NOTE — Care Management Note (Signed)
Case Management Note  Patient Details  Name: MARDEE CLUNE MRN: [id number] Date of Birth: July 05, 1926  Subjective/Objective:                    Action/Plan:   Expected Discharge Date:                  Expected Discharge Plan:  Home/Self Care  In-House Referral:  Clinical Social Work  Discharge planning Services  CM Consult  Post Acute Care Choice:  Home Health Choice offered to:  Patient  DME Arranged:    DME Agency:     HH Arranged:  RN, Nurse's Aide (CSW) [place]:  [place]  Status of Service:  Completed, signed off  Medicare Important Message Given:    Date Medicare IM Given:    Medicare IM give by:    Date Additional Medicare IM Given:    Additional Medicare Important Message give by:     If discussed at [place] of Stay Meetings, dates discussed:    Additional Comments: Pt HH arranged with [facility] (per pts choice). Romualdo Bolk of [facility] is aware and will collect the pts information from the chart. [place] services to start within 48 hours of discharge. Pt and pts nurse aware of discharge arrangements. Christinia Gully [place], RN 05/17/2015, 12:33 PM

## 2015-05-17 NOTE — Progress Notes (Signed)
Fall prevention/safety plan discussed with patient throughout the day. bedalarm on for safety, yellow nonslip socks, yellow fall risk armband applied. Instructed patient to call for assistance and not get up on her own for safety due to unsteady gait. MD aware, pt seen by PT per orders. Pt required reminders to call for assistance and not get up on her own. Safety sitter at bedside at this time. Donavan Foil, RN

## 2015-05-17 NOTE — Consult Note (Signed)
CARDIOLOGY CONSULT NOTE   Patient ID: [name] MRN: [id number] DOB/AGE: 1926-01-13 81 y.o.  Admit Date: 05/16/2015 Referring Physician: PTH Primary Physician: No PCP Per Patient Consulting Cardiologist: Kate Sable MD Primary Cardiologist Darlin Coco MD Reason for Consultation:   Clinical Summary [name] is a 81 y.o.female a history of known ischemic heart disease. She had coronary artery bypass graft surgery in 1989 with redo CABG in October 2000. She has additional history of paroxysmal atrial fibrillation. She is not on Coumadin because of a fall risk. She is on DAPT.in the setting of ICM. She has a history of hypercholesterolemia, anxiety, insomnia, and she is legally blind. . She had a more recent echocardiogram on 04/05/13 in [facility] showing an ejection fraction 50% with mild to moderate mitral regurgitation, mild aortic insufficiency, no aortic stenosis, and pulmonary artery pressure of 50. 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 Her amiodarone was stopped. She also has a history of breast cancer and is being followed by oncology.   She presented to ER with complaints of left sided chest pain, described as soreness. On arrival, BP 120/67; HR 97 bpm, Afebrile. Potassium 3.3, creatinine 1.0.  CXR. Mild vascular congestion accentuated by the poor inspiratory effort.EKG revealed atrial fib with T-wave depression infero/lateral. Troponin negative X 4.   She is pleasantly confused. Denies further dyspnea, but continues chest soreness. Difficult historian due to confusion.   Allergies  Allergen Reactions  . Amiodarone     Hypothyroidism    Medications Scheduled Medications: . anastrozole  1 mg Oral Daily  . aspirin EC  81 mg Oral Daily  . atorvastatin  20 mg Oral Daily  . clopidogrel  75 mg Oral Daily  . diltiazem  180 mg Oral Daily  . docusate sodium  100 mg Oral Daily  . enoxaparin (LOVENOX) injection   40 mg Subcutaneous Q24H  . ferrous gluconate  324 mg Oral Daily  . furosemide  60 mg Oral Daily  . isosorbide mononitrate  30 mg Oral QHS  . levothyroxine  50 mcg Oral QAC breakfast  . metoprolol succinate  25 mg Oral Daily  . pantoprazole  40 mg Oral BID  . potassium chloride SA  20 mEq Oral BID    Infusions:    PRN Medications: acetaminophen, ALPRAZolam, loratadine, nitroGLYCERIN, ondansetron (ZOFRAN) IV, temazepam   Past Medical History  Diagnosis Date  . Stroke   . Coronary artery disease     remote CABG in 1989 and redo in 2007; has residual stable angina  . Hypertension   . Hyperlipidemia   . PAF (paroxysmal atrial fibrillation)   . High risk medication use     on amiodarone  . Systolic and diastolic CHF, chronic     EF 45% per echo in 2012  . Anemia   . Depression   . Anxiety   . Macular degeneration   . Chronic anxiety   . Hypothyroidism 05/29/2012    Past Surgical History  Procedure Laterality Date  . Coronary artery bypass graft      1989 with redo 2007 1. Redo median sternotomy, extracorporeal circulation, redo coronary  . Abdominal hysterectomy    . Cardiac surgery      Family History  Problem Relation Age of Onset  . Heart disease Mother   . Aneurysm Mother     Social History [name] reports that she has quit smoking. She does not have any smokeless tobacco history  on file. [name] reports that she does not drink alcohol.  Review of Systems Complete review of systems are found to be negative unless outlined in H&P above.  Physical Examination Blood pressure 132/57, pulse 66, temperature 98.2 F (36.8 C), temperature source Oral, resp. rate 18, height 5\' 3"  (1.6 m), weight 107 lb 8 oz (48.762 kg), SpO2 98 %.  Intake/Output Summary (Last 24 hours) at 05/17/15 1012 Last data filed at 05/17/15 0425  Gross per 24 hour  Intake      0 ml  Output    200 ml  Net   -200 ml    Telemetry: Atrial fib rates in the 60's.   GEN: Pleasantly  confused.  HEENT: Conjunctiva and lids normal, oropharynx clear with moist mucosa. Neck: Supple, no elevated JVP or carotid bruits, no thyromegaly. Lungs: Clear to auscultation, nonlabored breathing at rest. Cardiac: Irregular rate and rhythm, no S3 or significant systolic murmur, no pericardial rub. Abdomen: Soft, nontender, no hepatomegaly, bowel sounds present, no guarding or rebound. Extremities: No pitting edema, distal pulses 2+. Skin: Warm and dry. Musculoskeletal: No kyphosis. Neuropsychiatric: Alert and oriented x2, mild confusion, affect grossly appropriate.  Prior Cardiac Testing/Procedures  1. Echocardiogram: 04/05/2013 (Transcribed from scanned document) 1.  The left ventricle chamber size is normal. Mild concentric left ventricular hypertrophy is observed. Left ventricular systolic function is lower limits of normal. The estimated ejection fraction is 50-55%. The left ventricular diastolic filling pattern is consistent with pseudonormalization, grade 2 diastolic dysfunction. The left ventricular diastolic filling pressure is consistent with elevated mean left atrial pressure. The basal inferior, and apical septal wall segments are hypokinetic (score 2). 2.  Left atrium is mildly dilated. 3. There is mild to moderate mitral regurg. 4.  Mild aortic leaflet calcification is visualized. There is aortic annular calcification. There is mild aortic regurg. The mean gradient of the aortic valve is 7 mmHg. 5.  There is mild tricuspid regurg. The right ventricular systolic pressure is calculated at 50 mmHg. There is evidence of moderate pulmonary hypertension. 6.  There is no pericardial effusion. 7.  CVP estimated at 10 mmHg.  (Read by Dr. Domenic Polite on 04/05/2013 )  2.Cardiac Cath 03/17/2006. Marland Kitchen Left main coronary artery is normal. 2. Left anterior descending is totally occluded proximally. 3. Left circumflex. The left circumflex has long segmental narrowing of  approximately 40 mm  in length. There is severe sequential 80-90%  narrowing. You could clearly see where the previous saphenous vein  graft was inserted and, distal to that, the obtuse marginal was  relatively large in size. It was a tortuous distal vessel with mild  atherosclerosis but no greater than 30-40% narrowing distally. 4. Left internal mammary graft to the LAD is widely patent with a nice  insertion and good distal runoff. There are multiple septal  perforating branches despite flow to the distal right coronary artery.  There is also collateral flow to the diagonal vessel. 5. Right coronary artery. The right coronary artery is totally occluded  in its midportion. There is a high right ventricular branch with a 90%  focal stenosis. In the proximal segment of the right coronary artery,  there is ostial narrowing with damping of the catheter as not enters  the right coronary artery and then diffuse 80-90% narrowing prior to  its total occlusion. 6. Saphenous vein graft to the right coronary artery is totally occluded. 7. Saphenous vein graft to diagonal is totally occluded. 8. Saphenous vein graft to the obtuse marginal is  totally occluded.  OVERALL IMPRESSION: 1. Moderate left ventricular dysfunction with inferobasilar akinesia and  anterolateral hypokinesia. 2. Severe three-vessel coronary disease. 3. Totally occluded saphenous vein grafts times 3 with patent left  internal mammary graft.   CABG:  Redo 08/04/2006 OPERATIVE PROCEDURES: 1. Redo median sternotomy, extracorporeal circulation, redo coronary  artery bypass graft surgery times three using a saphenous vein graft to  the obtuse marginal branch of the left circumflex coronary artery, a  saphenous vein graft to the distal right coronary artery, and a  saphenous vein graft to the acute marginal branch of the right coronary  artery. 2. Endoscopic  vein harvesting from the right leg.   Lab Results  Basic Metabolic Panel:  Recent Labs Lab 05/16/15 1934  NA 142  K 3.3*  CL 101  CO2 32  GLUCOSE 147*  BUN 25*  CREATININE 1.00  CALCIUM 8.5*    CBC:  Recent Labs Lab 05/16/15 1934  WBC 8.3  HGB 12.9  HCT 39.4  MCV 85.1  PLT 152    Cardiac Enzymes:  Recent Labs Lab 05/16/15 1934 05/16/15 2122 05/17/15 0007 05/17/15 0335  TROPONINI <0.03 <0.03 <0.03 <0.03    Radiology: Dg Chest 2 View  05/16/2015   CLINICAL DATA:  Left-sided chest pain for 4 hours, initial encounter  EXAM: CHEST - 2 VIEW  COMPARISON:  04/26/2013  FINDINGS: Cardiac shadow is within normal limits. Postsurgical changes are again seen. The overall inspiratory effort is poor with crowding of the vascular markings. Mild vascular congestion and interstitial changes are seen. No sizable effusion is noted. No focal infiltrate noted.  IMPRESSION: Mild vascular congestion accentuated by the poor inspiratory effort.   Electronically Signed   By: Inez Catalina M.D.   On: 05/16/2015 19:02    Impression and Recommendations  1.Atrial fibrillation: CHADS VASC Score of 6.She is not a candidate for anticoagulation due to frequent falls an confusion.She is currently rate controlled.  I am very concerned about her being home alone with current medical condition concerning atrial fib, confusion and frailty.Would consider social services consultation for SNF placement or Assisted living. Still on diltiazem, metoprolol.  2. ICM: Echo this admission pending. Last EF of 55%. She denies acute chest pain. States that she is sore over the left side of her body. Continue BB, ASA,, isosorbide and Plavix. Pain appears atypical in etiology. Troponin negative. She is on 60 mg of lasix po daily with normal EF? Question need for her be on such high dose with normal EF. Will replace potassium at level is 3.3.   3. CAD-S/P CABG with Redo: As above. Doubt cardiac etiology of chest  discomfort. Breathing status has improved. Troponin is negative.   4. Lung CA: She continues with oncology.      Signed: Phill Myron. Lawrence NP [facility]  05/17/2015, 10:12 AM Co-Sign MD  The patient was seen and examined, and I agree with the assessment and plan as documented above, with modifications as noted below. Pt with known CAD/CABG x 2 with redo in 2000 admitted with chest pain and has since ruled out for ACS with serial normal troponins. Followed by Dr. Mare Ferrari, seen in office on 03/26/15. Has paroxysmal atrial fibrillation and not deemed to be an anticoagulation candidate. Reportedly has breast cancer, legal blindness, and situational depression.  ECG today shows sinus rhythm with ST-T abnormality suggestive of lateral ischemia, with no marked changes from ECG in 2015 but V5-6 TWI when compared to 03/26/15. ECG from 05/16/15 showed atrial fibrillation, HR  109 bpm. CXR showed mild vascular congestion. Echocardiogram from 04/2013 showed moderate diastolic dysfunction (grade 2).  Chest pain occurred in context of tachycardia and atrial fibrillation. Given normal troponins and current sinus rhythm, would not pursue any stress testing as inpatient (will defer to Dr. Mare Ferrari as to whether or not he would like outpatient testing), but could consider conservative management with medical therapy. Already on Imdur 30 which can be increased if deemed necessary (as outpatient). No further recommendations.  I agree with Arnold Long that this patient needs either SNF placement or assisted living. She does not appear to be able to be home alone.

## 2015-05-17 NOTE — Care Management Note (Signed)
Case Management Note  Patient Details  Name: [name] MRN: [id number] Date of Birth: 02/06/1926  Subjective/Objective:                    Action/Plan:   Expected Discharge Date:                  Expected Discharge Plan:  Home/Self Care  In-House Referral:  Clinical Social Work  Discharge planning Services  CM Consult  Post Acute Care Choice:  NA Choice offered to:  NA  DME Arranged:    DME Agency:     HH Arranged:    [place] Agency:     Status of Service:  In process, will continue to follow  Medicare Important Message Given:    Date Medicare IM Given:    Medicare IM give by:    Date Additional Medicare IM Given:    Additional Medicare Important Message give by:     If discussed at [place] of Stay Meetings, dates discussed:    Additional Comments: Pt signed Medicare OBS notification form and form placed on shadow chart. Christinia Gully Levant, RN 05/17/2015, 11:44 AM

## 2015-05-17 NOTE — Evaluation (Addendum)
Physical Therapy Evaluation Patient Details Name: [name] MRN: [id number] DOB: [birth year] Today's Date: 05/17/2015   History of Present Illness  This is an 81 year old lady who has a history of coronary artery disease, with CABG and then redo CABG and who also has a history of paroxysmal atrial fibrillation, now presents with left-sided chest pain which started yesterday evening. Apparently it has been intermittent throughout today and she describes it as a soreness. She denies any dyspnea, sweating, nausea with it. It does not seem to radiate.  Clinical Impression   Pt was seen for evaluation.  She was alert and very cooperative but quite anxious and forgetful, repeating herself frequently.  Strength and balance were WNL ( with use of a cane), and she is able to ambulate functional distances with no loss of balance.  It is concerning that she is living alone with her advanced age and poor memory (question dementia) and she would be very appropriate to transition to ACLF in the future.    Follow Up Recommendations No PT follow up    Equipment Recommendations  None recommended by PT    Recommendations for Other Services   none    Precautions / Restrictions Precautions Precautions: Fall Restrictions Weight Bearing Restrictions: No      Mobility  Bed Mobility Overal bed mobility: Independent                Transfers Overall transfer level: Independent                  Ambulation/Gait Ambulation/Gait assistance: Modified independent (Device/Increase time) Ambulation Distance (Feet): 175 Feet Assistive device: Straight cane Gait Pattern/deviations: WFL(Within Functional Limits)   Gait velocity interpretation: at or above normal speed for age/gender General Gait Details: no LOB with ambulating on straight, with head turns, retro walking  Stairs            Wheelchair Mobility    Modified Rankin (Stroke Patients Only)       Balance Overall  balance assessment:  (dynamic standing balance is WNL)                                           Pertinent Vitals/Pain Pain Assessment: No/denies pain    Home Living Family/patient expects to be discharged to:: Private residence Living Arrangements: Alone Available Help at Discharge: Family;Available PRN/intermittently Type of Home: House Home Access: Level entry     Home Layout: One level Home Equipment: Walker - 2 wheels;Cane - single point;Bedside commode;Wheelchair - manual      Prior Function Level of Independence: Independent with assistive device(s)               Hand Dominance   Dominant Hand: Right    Extremity/Trunk Assessment   Upper Extremity Assessment: Overall WFL for tasks assessed           Lower Extremity Assessment: Overall WFL for tasks assessed      Cervical / Trunk Assessment: Kyphotic  Communication   Communication: No difficulties  Cognition Arousal/Alertness: Awake/alert Behavior During Therapy: WFL for tasks assessed/performed Overall Cognitive Status: Within Functional Limits for tasks assessed       Memory: Decreased short-term memory              General Comments      Exercises        Assessment/Plan    PT Assessment Patent  does not need any further PT services  PT Diagnosis     PT Problem List    PT Treatment Interventions     PT Goals (Current goals can be found in the Care Plan section) Acute Rehab PT Goals PT Goal Formulation: All assessment and education complete, DC therapy    Frequency     Barriers to discharge        Co-evaluation               End of Session Equipment Utilized During Treatment: Gait belt Activity Tolerance: Patient tolerated treatment well Patient left: in bed;with call bell/phone within reach;with bed alarm set Nurse Communication: Mobility status         Time: 1010-1037 PT Time Calculation (min) (ACUTE ONLY): 27 min   Charges:   PT  Evaluation $Initial PT Evaluation Tier I: 1 Procedure     PT G Codes:    Clinical Judgement                                                                                Mobility                                                                                Evaluation:  CH                                                                                Goal:  [name]                                                                                 Discharge:  [facility]  [name]  PT 05/17/2015, 10:47 AM [phone]

## 2015-05-17 NOTE — Clinical Social Work Note (Signed)
Clinical Social Work Assessment  Patient Details  Name: [name] MRN: [id number] Date of Birth: 1926/08/09  Date of referral:  05/17/15               Reason for consult:  Facility Placement                Permission sought to share information with:  Family Supports Permission granted to share information::  Yes, Verbal Permission Granted  Name::     Media planner::     Relationship::  niece  Contact Information:     Housing/Transportation Living arrangements for the past 2 months:  Single Family Home Source of Information:  Patient Patient Interpreter Needed:  None Criminal Activity/Legal Involvement Pertinent to Current Situation/Hospitalization:  No - Comment as needed Significant Relationships:  Other Family Members Lives with:  Self Do you feel safe going back to the place where you live?  Yes Need for family participation in patient care:  Yes (Comment)  Care giving concerns:  Pt lives alone. Concern from cardiology about pt's ability to manage alone.    Social Worker assessment / plan:  CSW met with pt at bedside. Pt reports she called EMS from home due to chest pain. Cardiology was concerned about pt's ability to live alone due to forgetfulness and repeating statements. Pt was very hard of hearing, but answered CSW questions appropriately when question was heard. She lives alone, and describes her nieces, Antony Salmon and Zigmund Daniel as her best support. Pt's best friend died in September 02, 2023 and she has had a difficult adjustment since then. Cherie helps pt the most and pt indicates that she will pay her bills and get pt's groceries. They also call to check on pt. Pt reports her friend sold her car because he didn't want her driving. She relies on her nieces for transport. Pt is very "ashamed" that she has been unable to reach her nieces and this has greatly upset her. Pt reports she cooks her own meals and does not have any problems at home. A friend mows her yard, but she takes care of  everything indoors. CSW discussed ALF with pt. She states, "I don't want to go until I absolutely have to, and I'm not there yet." She shared that her brother went to ALF and describes this situation as "rough." CSW left voicemail for [facility] requesting return call. CSW discussed with MD who feels pt has capacity and recommended home health RN/SW/Aide. CSW notified CM. Pt is attempting to reach family for ride home.    Employment status:  Retired Nurse, adult PT Recommendations:  No Follow Up Information / Referral to community resources:  Other (Comment Required) (assisted living)  Patient/Family's Response to care:  Pt requests to return home.   Patient/Family's Understanding of and Emotional Response to Diagnosis, Current Treatment, and Prognosis:  Pt was able to explain hospital admission reason to [place] and is ready to d/c home.   Emotional Assessment Appearance:  Appears younger than stated age Attitude/Demeanor/Rapport:  Apprehensive Affect (typically observed):  Other (upset) Orientation:  Oriented to Self, Oriented to Place, Oriented to Situation Alcohol / Substance use:  Not Applicable Psych involvement (Current and /or in the community):     Discharge Needs  Concerns to be addressed:  Discharge Planning Concerns Readmission within the last 30 days:  No Current discharge risk:  Lives alone Barriers to Discharge:  No Barriers Identified   Salome Arnt, [place] 05/17/2015, 12:44 PM [phone]

## 2015-05-18 DIAGNOSIS — R079 Chest pain, unspecified: Secondary | ICD-10-CM | POA: Diagnosis not present

## 2015-05-18 DIAGNOSIS — F419 Anxiety disorder, unspecified: Secondary | ICD-10-CM

## 2015-05-18 DIAGNOSIS — I255 Ischemic cardiomyopathy: Secondary | ICD-10-CM | POA: Diagnosis not present

## 2015-05-18 LAB — BASIC METABOLIC PANEL
Anion gap: 8 (ref 5–15)
BUN: 23 mg/dL — AB (ref 6–20)
CO2: 31 mmol/L (ref 22–32)
CREATININE: 1 mg/dL (ref 0.44–1.00)
Calcium: 8.7 mg/dL — ABNORMAL LOW (ref 8.9–10.3)
Chloride: 102 mmol/L (ref 101–111)
GFR calc Af Amer: 57 mL/min — ABNORMAL LOW (ref >60)
GFR calc non Af Amer: 49 mL/min — ABNORMAL LOW (ref >60)
GLUCOSE: 99 mg/dL (ref 65–99)
POTASSIUM: 3.9 mmol/L (ref 3.5–5.1)
SODIUM: 141 mmol/L (ref 135–145)

## 2015-05-18 NOTE — Discharge Summary (Signed)
Physician Discharge Summary  [name]:[id number] DOB: 1925-12-17 DOA: 05/16/2015  PCP: No PCP Per Patient  Admit date: 05/16/2015 Discharge date: 05/18/2015  Time spent: 25* minutes  Recommendations for Outpatient Follow-up:  1. Follow up cardiology in 2 weeks   Discharge Diagnoses:  Active Problems:   Paroxysmal atrial fibrillation   Anxiety   Cardiomyopathy, ischemic   Chest pain   Discharge Condition: Stable  Diet recommendation: Low salt diet  Filed Weights   05/16/15 1828 05/16/15 2300  Weight: 48.535 kg (107 lb) 48.762 kg (107 lb 8 oz)    History of present illness:  81 year old [name] who has a history of coronary artery disease, with CABG and then redo CABG and who also has a history of paroxysmal atrial fibrillation, now presents with left-sided chest pain which started yesterday evening. Apparently it has been intermittent throughout today and she describes it as a soreness. She denies any dyspnea, sweating, nausea with it. It does not seem to radiate. She denies any abdominal pain, vomiting, hemoptysis  Hospital Course:  1. Chest pain- resolved,  patient has a history of CAD, cardiology consulted, recommend to optimize the medical therapy. 2. CAD- continue plavix , aspirin, Imdur, Lasix 3. CHF- Echo shows grade 1 diastolic dysfunction, continue with lasix 60 mg po daily. 4. Atrial fibrillation- patient is not on anticoagulation due to frequent falls. Continue diltiazem and metoprolol. 5. Hypokalemia- potassium was replaced, will check bmp today. 6. Social situation- patient lives by herself and has 2 nieces take care of her. Patient was offered to go to assisted living facility, but at this time she has decided to go home with home health services.  Procedures:  Echocardiogram  Consultations:  Cardiology   Discharge Exam: Filed Vitals:   05/18/15 0530  BP: 121/50  Pulse: 56  Temp: 98 F (36.7 C)  Resp: 18    General: Appear in no acute  distress Cardiovascular: S1S2 RRR Respiratory: Clear bilaterally  Discharge Instructions   Discharge Instructions    Diet - low sodium heart healthy    Complete by:  As directed      Increase activity slowly    Complete by:  As directed           Current Discharge Medication List    CONTINUE these medications which have NOT CHANGED   Details  ALPRAZolam (XANAX) 0.25 MG tablet Take 0.25 mg by mouth 3 (three) times daily as needed for anxiety (for anxiety).    anastrozole (ARIMIDEX) 1 MG tablet Take 1 mg by mouth daily.    aspirin EC 81 MG tablet Take 81 mg by mouth daily.    atorvastatin (LIPITOR) 20 MG tablet Take 1 tablet (20 mg total) by mouth daily. Qty: 90 tablet, Refills: 3    clopidogrel (PLAVIX) 75 MG tablet TAKE 1 TABLET BY MOUTH EVERY DAY Qty: 90 tablet, Refills: 3    diltiazem (CARDIZEM CD) 180 MG 24 hr capsule Take 1 capsule (180 mg total) by mouth daily. Qty: 90 capsule, Refills: 3    docusate sodium (COLACE) 50 MG capsule Take 50 mg by mouth as directed. 2 daily    ferrous gluconate (FERGON) 240 (27 FE) MG tablet Take 240 mg by mouth daily.    furosemide (LASIX) 40 MG tablet TAKE 1 AND 1/2 TABLETS BY MOUTH DAILY Qty: 135 tablet, Refills: 3    isosorbide mononitrate (IMDUR) 60 MG 24 hr tablet TAKE 1/2 TABLET BY MOUTH AT BEDTIME Qty: 30 tablet, Refills: 6  levothyroxine (SYNTHROID, LEVOTHROID) 50 MCG tablet TAKE 1 TABLET BY MOUTH EVERY DAY Qty: 90 tablet, Refills: 3    loratadine (CLARITIN) 10 MG tablet Take 10 mg by mouth daily as needed for allergies (for allergies).    metoprolol succinate (TOPROL-XL) 25 MG 24 hr tablet Take 1 tablet (25 mg total) by mouth daily. Qty: 90 tablet, Refills: 3    nitroGLYCERIN (NITROSTAT) 0.4 MG SL tablet DISSOLVE 1 TABLET UNDER TONGUE EVERY 5 MINUTES AS DIRECTED AS NEEDED FOR CHEST PAIN Qty: 25 tablet, Refills: 3    pantoprazole (PROTONIX) 40 MG tablet Take 1 tablet (40 mg total) by mouth 2 (two) times daily. Qty:  180 tablet, Refills: 3    potassium chloride SA (K-DUR,KLOR-CON) 20 MEQ tablet Take 1 tablet (20 mEq total) by mouth 2 (two) times daily. Qty: 180 tablet, Refills: 3   Associated Diagnoses: Hypokalemia    temazepam (RESTORIL) 30 MG capsule TAKE ONE CAPSULE AT BEDTIME AS NEEDED Qty: 30 capsule, Refills: 5   Associated Diagnoses: Insomnia       Allergies  Allergen Reactions  . Amiodarone     Hypothyroidism   Follow-up Information    Follow up with [facility].   Contact information:   [address] [place] [address] [phone]        The results of significant diagnostics from this hospitalization (including imaging, microbiology, ancillary and laboratory) are listed below for reference.    Significant Diagnostic Studies: Dg Chest 2 View  05/16/2015   CLINICAL DATA:  Left-sided chest pain for 4 hours, initial encounter  EXAM: CHEST - 2 VIEW  COMPARISON:  04/26/2013  FINDINGS: Cardiac shadow is within normal limits. Postsurgical changes are again seen. The overall inspiratory effort is poor with crowding of the vascular markings. Mild vascular congestion and interstitial changes are seen. No sizable effusion is noted. No focal infiltrate noted.  IMPRESSION: Mild vascular congestion accentuated by the poor inspiratory effort.   Electronically Signed   By: Inez Catalina M.D.   On: 05/16/2015 19:02    Microbiology: No results found for this or any previous visit (from the past 240 hour(s)).   Labs: Basic Metabolic Panel:  Recent Labs Lab 05/16/15 1934  NA 142  K 3.3*  CL 101  CO2 32  GLUCOSE 147*  BUN 25*  CREATININE 1.00  CALCIUM 8.5*   Liver Function Tests: No results for input(s): AST, ALT, ALKPHOS, BILITOT, PROT, ALBUMIN in the last 168 hours. No results for input(s): LIPASE, AMYLASE in the last 168 hours. No results for input(s): AMMONIA in the last 168 hours. CBC:  Recent Labs Lab 05/16/15 1934  WBC 8.3  HGB 12.9  HCT 39.4   MCV 85.1  PLT 152   Cardiac Enzymes:  Recent Labs Lab 05/16/15 1934 05/16/15 2122 05/17/15 0007 05/17/15 0335  TROPONINI <0.03 <0.03 <0.03 <0.03   BNP: BNP (last 3 results)  Recent Labs  05/16/15 1934  BNP 252.0*    ProBNP (last 3 results) No results for input(s): PROBNP in the last 8760 hours.  CBG: No results for input(s): GLUCAP in the last 168 hours.     SignedEleonore Chiquito S  [facility] 05/18/2015, 9:39 AM

## 2015-05-18 NOTE — Progress Notes (Signed)
[facility] called and informed of patient's discharge, states they have everything they ned.

## 2015-05-24 ENCOUNTER — Other Ambulatory Visit: Payer: Self-pay | Admitting: Cardiology

## 2015-05-24 ENCOUNTER — Telehealth: Payer: Self-pay | Admitting: Cardiology

## 2015-05-24 NOTE — Telephone Encounter (Signed)
New Message  Pt calling about refills of isosorbide, Tenazepan. Please call back and discuss.

## 2015-05-27 ENCOUNTER — Other Ambulatory Visit: Payer: Self-pay | Admitting: Cardiology

## 2015-05-27 ENCOUNTER — Telehealth: Payer: Self-pay

## 2015-05-27 ENCOUNTER — Other Ambulatory Visit: Payer: Self-pay

## 2015-05-27 DIAGNOSIS — G4700 Insomnia, unspecified: Secondary | ICD-10-CM

## 2015-05-27 MED ORDER — TEMAZEPAM 30 MG PO CAPS: 30.0000 mg | ORAL_CAPSULE | Freq: Every day | ORAL | Status: DC

## 2015-05-27 NOTE — Telephone Encounter (Signed)
Okay to refill temazepam

## 2015-05-27 NOTE — Telephone Encounter (Signed)
Patient's caregiver called requesting a refill for Temazapam.  She states patient cannot sleep without it. Please advise.

## 2015-05-28 NOTE — Telephone Encounter (Signed)
Rx called to [facility] as requested  Rx did not print as Epic indicated

## 2015-07-23 ENCOUNTER — Other Ambulatory Visit: Payer: Self-pay | Admitting: Cardiology

## 2015-07-23 DIAGNOSIS — F419 Anxiety disorder, unspecified: Secondary | ICD-10-CM

## 2015-07-29 NOTE — Telephone Encounter (Signed)
Okay to refill? 

## 2015-09-17 ENCOUNTER — Ambulatory Visit: Payer: Medicare Other | Admitting: Cardiology

## 2015-09-22 ENCOUNTER — Other Ambulatory Visit: Payer: Self-pay | Admitting: Cardiology

## 2015-10-14 ENCOUNTER — Encounter: Payer: Self-pay | Admitting: Cardiology

## 2015-10-14 ENCOUNTER — Ambulatory Visit (INDEPENDENT_AMBULATORY_CARE_PROVIDER_SITE_OTHER): Payer: Medicare Other | Admitting: Cardiology

## 2015-10-14 VITALS — BP 112/74 | HR 74 | Ht 63.0 in | Wt 108.0 lb

## 2015-10-14 DIAGNOSIS — E876 Hypokalemia: Secondary | ICD-10-CM | POA: Diagnosis not present

## 2015-10-14 DIAGNOSIS — I480 Paroxysmal atrial fibrillation: Secondary | ICD-10-CM | POA: Diagnosis not present

## 2015-10-14 DIAGNOSIS — I259 Chronic ischemic heart disease, unspecified: Secondary | ICD-10-CM

## 2015-10-14 MED ORDER — ATORVASTATIN CALCIUM 20 MG PO TABS: 20.0000 mg | ORAL_TABLET | Freq: Every day | ORAL | Status: DC

## 2015-10-14 MED ORDER — CLOPIDOGREL BISULFATE 75 MG PO TABS: 75.0000 mg | ORAL_TABLET | Freq: Every day | ORAL | Status: DC

## 2015-10-14 MED ORDER — DILTIAZEM HCL ER COATED BEADS 180 MG PO CP24: 180.0000 mg | ORAL_CAPSULE | Freq: Every day | ORAL | Status: DC

## 2015-10-14 MED ORDER — FUROSEMIDE 40 MG PO TABS: 60.0000 mg | ORAL_TABLET | Freq: Every day | ORAL | Status: DC

## 2015-10-14 MED ORDER — ISOSORBIDE MONONITRATE ER 60 MG PO TB24: 30.0000 mg | ORAL_TABLET | Freq: Every day | ORAL | Status: DC

## 2015-10-14 MED ORDER — POTASSIUM CHLORIDE CRYS ER 20 MEQ PO TBCR: 20.0000 meq | EXTENDED_RELEASE_TABLET | Freq: Two times a day (BID) | ORAL | Status: DC

## 2015-10-14 MED ORDER — LEVOTHYROXINE SODIUM 50 MCG PO TABS: ORAL_TABLET | ORAL | Status: DC

## 2015-10-14 MED ORDER — METOPROLOL SUCCINATE ER 25 MG PO TB24: 25.0000 mg | ORAL_TABLET | Freq: Every day | ORAL | Status: DC

## 2015-10-14 NOTE — Patient Instructions (Signed)
Medication Instructions:  Your physician recommends that you continue on your current medications as directed. Please refer to the Current Medication list given to you today.  Labwork: none  Testing/Procedures: none  Follow-Up: Your physician wants you to follow-up in: 6 month ov with Dr Mayer Masker will receive a reminder letter in the mail two months in advance. If you don't receive a letter, please call our office to schedule the follow-up appointment.  If you need a refill on your cardiac medications before your next appointment, please call your pharmacy.

## 2015-10-14 NOTE — Progress Notes (Signed)
Cardiology Office Note   Date:  10/14/2015   ID:  [name], DOB 12/01/1925, MRN [id number]  PCP:  No PCP Per Patient  Cardiologist: Darlin Coco MD  No chief complaint on file.     History of Present Illness: [name] is a 81 y.o. female who presents for scheduled follow-up visit  This pleasant 81 year old woman is seen for a followup office visit. She has a history of known ischemic heart disease. She had coronary artery bypass graft surgery in 1989. She had redo CABG in October 2000. He is a past history of paroxysmal atrial fibrillation. She is not on Coumadin because of a fall risk. She is on aspirin and Plavix because of her ischemic heart disease. She has a history of hypercholesterolemia, anxiety, insomnia, and she is legally blind. . She had a more recent echocardiogram on 04/05/13 in [facility] showing an ejection fraction 50% with mild to moderate mitral regurgitation, mild aortic insufficiency, no aortic stenosis, and pulmonary artery pressure of 50. 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 Her amiodarone was stopped four months ago. The patient is now on Synthroid. She has felt better over the past month.  Since last visit the patient has not been expressing any chest pain and has not had to use any sublingual nitroglycerin. Since last visit she was found to have a cancer of the left breast. This is being treated with anastrozole 1 mg daily.  Dr.Neijstrom is her oncologist. She states that she feels better now than she has a long time. This may be beneficial effect of treating her hypothyroidism. At her last visit the patient was dealing with situational depression. She appears to be less depressed today. She is still living by herself. Her daughter looks after her frequently.   Past Medical History  Diagnosis Date  . Stroke ([place])   . Coronary artery disease     remote CABG in 1989 and redo in 2007;  has residual stable angina  . Hypertension   . Hyperlipidemia   . PAF (paroxysmal atrial fibrillation) ([place])   . High risk medication use     on amiodarone  . Systolic and diastolic CHF, chronic ([place])     EF 45% per echo in 2012  . Anemia   . Depression   . Anxiety   . Macular degeneration   . Chronic anxiety   . Hypothyroidism 05/29/2012    Past Surgical History  Procedure Laterality Date  . Coronary artery bypass graft      1989 with redo 2007 1. Redo median sternotomy, extracorporeal circulation, redo coronary  . Abdominal hysterectomy    . Cardiac surgery       Current Outpatient Prescriptions  Medication Sig Dispense Refill  . ALPRAZolam (XANAX) 0.25 MG tablet Take 0.25 mg by mouth 3 (three) times daily as needed for anxiety.    Marland Kitchen anastrozole (ARIMIDEX) 1 MG tablet Take 1 mg by mouth daily.    Marland Kitchen aspirin EC 81 MG tablet Take 81 mg by mouth daily.    Marland Kitchen atorvastatin (LIPITOR) 20 MG tablet Take 1 tablet (20 mg total) by mouth daily. 90 tablet 3  . clopidogrel (PLAVIX) 75 MG tablet Take 75 mg by mouth daily.    Marland Kitchen diltiazem (CARDIZEM CD) 180 MG 24 hr capsule Take 1 capsule (180 mg total) by mouth daily. 90 capsule 3  . docusate sodium (COLACE) 50 MG capsule Take 50 mg by mouth as  directed. 2 daily    . ferrous gluconate (FERGON) 240 (27 FE) MG tablet Take 240 mg by mouth daily.    . furosemide (LASIX) 40 MG tablet Take 60 mg by mouth daily.    . isosorbide mononitrate (IMDUR) 60 MG 24 hr tablet TAKE 1/2 TABLET BY MOUTH AT BEDTIME 30 tablet 2  . levothyroxine (SYNTHROID, LEVOTHROID) 50 MCG tablet TAKE 1 TABLET BY MOUTH EVERY DAY 90 tablet 3  . loratadine (CLARITIN) 10 MG tablet Take 10 mg by mouth daily as needed for allergies (for allergies).    . metoprolol succinate (TOPROL-XL) 25 MG 24 hr tablet Take 1 tablet (25 mg total) by mouth daily. 90 tablet 3  . nitroGLYCERIN (NITROSTAT) 0.4 MG SL tablet DISSOLVE 1 TABLET UNDER TONGUE EVERY 5 MINUTES AS DIRECTED AS NEEDED FOR CHEST  PAIN 25 tablet 3  . pantoprazole (PROTONIX) 40 MG tablet Take 1 tablet (40 mg total) by mouth 2 (two) times daily. 180 tablet 3  . potassium chloride SA (K-DUR,KLOR-CON) 20 MEQ tablet Take 1 tablet (20 mEq total) by mouth 2 (two) times daily. 180 tablet 3  . temazepam (RESTORIL) 30 MG capsule Take 1 capsule (30 mg total) by mouth at bedtime. 30 capsule 5   No current facility-administered medications for this visit.    Allergies:   Amiodarone    Social History:  The patient  reports that she has quit smoking. She does not have any smokeless tobacco history on file. She reports that she does not drink alcohol or use illicit drugs.   Family History:  The patient's family history includes Aneurysm in her mother; Heart disease in her mother.    ROS:  Please see the history of present illness.   Otherwise, review of systems are positive for none.   All other systems are reviewed and negative.    PHYSICAL EXAM: VS:  BP 112/74 mmHg  Pulse 74  Ht 5\' 3"  (1.6 m)  Wt 108 lb (48.988 kg)  BMI 19.14 kg/m2 , BMI Body mass index is 19.14 kg/(m^2). GEN: Well nourished, well developed, in no acute distress HEENT: normal Neck: no JVD, carotid bruits, or masses Cardiac: RRR; no murmurs, rubs, or gallops,no edema  Respiratory:  clear to auscultation bilaterally, normal work of breathing GI: soft, nontender, nondistended, + BS MS: no deformity or atrophy Skin: warm and dry, no rash Neuro:  Strength and sensation are intact Psych: euthymic mood, full affect   EKG:  EKG is ordered today. The ekg ordered today demonstrates normal sinus rhythm at 76 bpm.  Old anteroseptal infarct.   Recent Labs: 03/26/2015: ALT 7; TSH 0.18* 05/16/2015: B Natriuretic Peptide 252.0*; Hemoglobin 12.9; Platelets 152 05/18/2015: BUN 23*; Creatinine, Ser 1.00; Potassium 3.9; Sodium 141    Lipid Panel    Component Value Date/Time   CHOL 150 03/26/2015 1232   TRIG 132.0 03/26/2015 1232   HDL 67.60 03/26/2015 1232    CHOLHDL 2 03/26/2015 1232   VLDL 26.4 03/26/2015 1232   LDLCALC 56 03/26/2015 1232      Wt Readings from Last 3 Encounters:  10/14/15 108 lb (48.988 kg)  05/16/15 107 lb 8 oz (48.762 kg)  03/26/15 107 lb 12.8 oz (48.898 kg)         ASSESSMENT AND PLAN:  1. ischemic heart disease status post CABG in 1989 and redo CABG in 2000 2. paroxysmal atrial fibrillation not on Coumadin because of fall risk. He is maintaining normal sinus rhythm off amiodarone 3. grade 2 diastolic dysfunction  4. hypothyroidism developing while on previous amiodarone 5. Breast cancer of left breast being followed by Dr. Tressie Stalker.     Current medicines are reviewed at length with the patient today.  The patient does not have concerns regarding medicines.  The following changes have been made:  no change  Labs/ tests ordered today include:   Orders Placed This Encounter  Procedures  . EKG 12-Lead    Disposition: The patient will continue same medication.  After my retirement she will see cardiology in [place].  Berna Spare MD 10/14/2015 12:44 PM    [address], [place], [place]  [address] Phone: [phone]; Fax: [phone]

## 2015-10-24 ENCOUNTER — Other Ambulatory Visit: Payer: Self-pay | Admitting: Cardiology

## 2015-10-24 NOTE — Telephone Encounter (Signed)
isosorbide mononitrate (IMDUR) 60 MG 24 hr tablet  Medication   Date: 10/14/2015  Department: [address] Office  Ordering/Authorizing: Darlin Coco, MD      Order Providers    Prescribing Provider Encounter Provider   Darlin Coco, MD Darlin Coco, MD    Medication Detail      Disp Refills Start End     isosorbide mononitrate (IMDUR) 60 MG 24 hr tablet 45 tablet 3 10/14/2015     Sig - Route: Take 0.5 tablets (30 mg total) by mouth at bedtime. - Oral    E-Prescribing Status: Receipt confirmed by pharmacy (10/14/2015 1:29 PM EST)     Pharmacy    [id number] - [place] [place]

## 2015-10-25 ENCOUNTER — Other Ambulatory Visit: Payer: Self-pay | Admitting: Cardiology

## 2015-11-16 ENCOUNTER — Emergency Department (HOSPITAL_COMMUNITY): Payer: Medicare Other

## 2015-11-16 ENCOUNTER — Emergency Department (HOSPITAL_COMMUNITY)
Admission: EM | Admit: 2015-11-16 | Discharge: 2015-11-17 | Disposition: A | Discharge status: Home / Self Care | Payer: Medicare Other | Attending: Emergency Medicine | Admitting: Emergency Medicine

## 2015-11-16 ENCOUNTER — Encounter (HOSPITAL_COMMUNITY): Payer: Self-pay

## 2015-11-16 DIAGNOSIS — E785 Hyperlipidemia, unspecified: Secondary | ICD-10-CM | POA: Diagnosis not present

## 2015-11-16 DIAGNOSIS — Z87891 Personal history of nicotine dependence: Secondary | ICD-10-CM | POA: Insufficient documentation

## 2015-11-16 DIAGNOSIS — R079 Chest pain, unspecified: Secondary | ICD-10-CM | POA: Diagnosis present

## 2015-11-16 DIAGNOSIS — E039 Hypothyroidism, unspecified: Secondary | ICD-10-CM | POA: Insufficient documentation

## 2015-11-16 DIAGNOSIS — Z951 Presence of aortocoronary bypass graft: Secondary | ICD-10-CM | POA: Diagnosis not present

## 2015-11-16 DIAGNOSIS — Z79899 Other long term (current) drug therapy: Secondary | ICD-10-CM | POA: Diagnosis not present

## 2015-11-16 DIAGNOSIS — I10 Essential (primary) hypertension: Secondary | ICD-10-CM | POA: Diagnosis not present

## 2015-11-16 DIAGNOSIS — Z7982 Long term (current) use of aspirin: Secondary | ICD-10-CM | POA: Diagnosis not present

## 2015-11-16 DIAGNOSIS — F419 Anxiety disorder, unspecified: Secondary | ICD-10-CM | POA: Insufficient documentation

## 2015-11-16 DIAGNOSIS — I5042 Chronic combined systolic (congestive) and diastolic (congestive) heart failure: Secondary | ICD-10-CM | POA: Insufficient documentation

## 2015-11-16 DIAGNOSIS — Z8673 Personal history of transient ischemic attack (TIA), and cerebral infarction without residual deficits: Secondary | ICD-10-CM | POA: Diagnosis not present

## 2015-11-16 DIAGNOSIS — D649 Anemia, unspecified: Secondary | ICD-10-CM | POA: Diagnosis not present

## 2015-11-16 DIAGNOSIS — I2510 Atherosclerotic heart disease of native coronary artery without angina pectoris: Secondary | ICD-10-CM | POA: Insufficient documentation

## 2015-11-16 DIAGNOSIS — I480 Paroxysmal atrial fibrillation: Secondary | ICD-10-CM | POA: Insufficient documentation

## 2015-11-16 DIAGNOSIS — Z7902 Long term (current) use of antithrombotics/antiplatelets: Secondary | ICD-10-CM | POA: Diagnosis not present

## 2015-11-16 DIAGNOSIS — F329 Major depressive disorder, single episode, unspecified: Secondary | ICD-10-CM | POA: Diagnosis not present

## 2015-11-16 DIAGNOSIS — R600 Localized edema: Secondary | ICD-10-CM | POA: Insufficient documentation

## 2015-11-16 DIAGNOSIS — Z8669 Personal history of other diseases of the nervous system and sense organs: Secondary | ICD-10-CM | POA: Diagnosis not present

## 2015-11-16 LAB — COMPREHENSIVE METABOLIC PANEL
ALT: 14 U/L (ref 14–54)
AST: 22 U/L (ref 15–41)
Albumin: 3.8 g/dL (ref 3.5–5.0)
Alkaline Phosphatase: 75 U/L (ref 38–126)
Anion gap: 9 (ref 5–15)
BUN: 23 mg/dL — ABNORMAL HIGH (ref 6–20)
CHLORIDE: 99 mmol/L — AB (ref 101–111)
CO2: 33 mmol/L — ABNORMAL HIGH (ref 22–32)
CREATININE: 1.2 mg/dL — AB (ref 0.44–1.00)
Calcium: 8.8 mg/dL — ABNORMAL LOW (ref 8.9–10.3)
GFR, EST AFRICAN AMERICAN: 46 mL/min — AB (ref >60)
GFR, EST NON AFRICAN AMERICAN: 39 mL/min — AB (ref >60)
Glucose, Bld: 112 mg/dL — ABNORMAL HIGH (ref 65–99)
Potassium: 3.5 mmol/L (ref 3.5–5.1)
Sodium: 141 mmol/L (ref 135–145)
Total Bilirubin: 1 mg/dL (ref 0.3–1.2)
Total Protein: 6.3 g/dL — ABNORMAL LOW (ref 6.5–8.1)

## 2015-11-16 LAB — CBC
HCT: 36.7 % (ref 36.0–46.0)
Hemoglobin: 12.2 g/dL (ref 12.0–15.0)
MCH: 28.8 pg (ref 26.0–34.0)
MCHC: 33.2 g/dL (ref 30.0–36.0)
MCV: 86.8 fL (ref 78.0–100.0)
PLATELETS: 148 10*3/uL — AB (ref 150–400)
RBC: 4.23 MIL/uL (ref 3.87–5.11)
RDW: 13.5 % (ref 11.5–15.5)
WBC: 7.3 10*3/uL (ref 4.0–10.5)

## 2015-11-16 LAB — APTT: aPTT: 30 seconds (ref 24–37)

## 2015-11-16 LAB — TROPONIN I
Troponin I: 0.03 ng/mL (ref <0.031)
Troponin I: <0.03 ng/mL (ref <0.031)

## 2015-11-16 LAB — PROTIME-INR
INR: 1.05 (ref 0.00–1.49)
PROTHROMBIN TIME: 13.9 s (ref 11.6–15.2)

## 2015-11-16 NOTE — ED Notes (Signed)
MD at bedside. 

## 2015-11-16 NOTE — ED Notes (Signed)
Patient c/o chest pain that started this morning when she woke up. Reports of getting worse through out the day. EMS reports patient took 7 nitro at home with "little" amount of relief. NAD noted.

## 2015-11-16 NOTE — ED Provider Notes (Signed)
CSN: [id number]     Arrival date & time 11/16/15  1543 History   First MD Initiated Contact with Patient 11/16/15 1548     Chief Complaint  Patient presents with  . Chest Pain     (Consider location/radiation/quality/duration/timing/severity/associated sxs/prior Treatment) HPI  The patient is an 82 year old female, she does have a history of known coronary disease with a history of coronary artery bypass grafting in 1989 and then again in 2007. Since that time she has had stable angina but from time to time has flareups and in fact this morning she started to have significant chest pain which caused her to use her nitroglycerin. She used approximately 7 tablets of her nitroglycerin, this gradually improved her symptoms until this time she states that she can barely feel it. She feels a heaviness or pressure on her chest in the middle, there is no radiation, no shortness of breath, no coughing, no fevers, minimal swelling of the legs.  Past Medical History  Diagnosis Date  . Stroke ([place])   . Coronary artery disease     remote CABG in 1989 and redo in 2007; has residual stable angina  . Hypertension   . Hyperlipidemia   . PAF (paroxysmal atrial fibrillation) ([place])   . High risk medication use     on amiodarone  . Systolic and diastolic CHF, chronic ([place])     EF 45% per echo in 2012  . Anemia   . Depression   . Anxiety   . Macular degeneration   . Chronic anxiety   . Hypothyroidism 05/29/2012   Past Surgical History  Procedure Laterality Date  . Coronary artery bypass graft      1989 with redo 2007 1. Redo median sternotomy, extracorporeal circulation, redo coronary  . Abdominal hysterectomy    . Cardiac surgery     Family History  Problem Relation Age of Onset  . Heart disease Mother   . Aneurysm Mother    Social History  Substance Use Topics  . Smoking status: Former Research scientist (life sciences)  . Smokeless tobacco: None  . Alcohol Use: No   OB History    No data available     Review  of Systems  All other systems reviewed and are negative.     Allergies  Amiodarone  Home Medications   Prior to Admission medications   Medication Sig Start Date End Date Taking? Authorizing Provider  ALPRAZolam (XANAX) 0.25 MG tablet Take 0.25 mg by mouth 3 (three) times daily as needed for anxiety.   Yes Historical Provider, MD  anastrozole (ARIMIDEX) 1 MG tablet Take 1 mg by mouth daily. 11/20/14 11/20/15 Yes Historical Provider, MD  aspirin EC 81 MG tablet Take 81 mg by mouth daily.   Yes Historical Provider, MD  atorvastatin (LIPITOR) 20 MG tablet Take 1 tablet (20 mg total) by mouth daily. 10/14/15  Yes Darlin Coco, MD  clopidogrel (PLAVIX) 75 MG tablet Take 1 tablet (75 mg total) by mouth daily. 10/14/15  Yes Darlin Coco, MD  diltiazem (CARDIZEM CD) 180 MG 24 hr capsule Take 1 capsule (180 mg total) by mouth daily. 10/14/15  Yes Darlin Coco, MD  ferrous gluconate (FERGON) 240 (27 FE) MG tablet Take 240 mg by mouth daily.   Yes Historical Provider, MD  furosemide (LASIX) 40 MG tablet Take 1.5 tablets (60 mg total) by mouth daily. 10/14/15  Yes Darlin Coco, MD  isosorbide mononitrate (IMDUR) 60 MG 24 hr tablet TAKE 1/2 TABLET BY MOUTH AT BEDTIME Patient taking differently:  Take 0.5 TABLET BY MOUTH AT BEDTIME 10/25/15  Yes Darlin Coco, MD  levothyroxine (SYNTHROID, LEVOTHROID) 50 MCG tablet TAKE 1 TABLET BY MOUTH EVERY DAY 10/14/15  Yes Darlin Coco, MD  metoprolol succinate (TOPROL-XL) 25 MG 24 hr tablet Take 1 tablet (25 mg total) by mouth daily. 10/14/15  Yes Darlin Coco, MD  nitroGLYCERIN (NITROSTAT) 0.4 MG SL tablet DISSOLVE 1 TABLET UNDER TONGUE EVERY 5 MINUTES AS DIRECTED AS NEEDED FOR CHEST PAIN 09/23/15  Yes Darlin Coco, MD  pantoprazole (PROTONIX) 40 MG tablet Take 1 tablet (40 mg total) by mouth 2 (two) times daily. 10/18/14  Yes Darlin Coco, MD  potassium chloride SA (K-DUR,KLOR-CON) 20 MEQ tablet Take 1 tablet (20 mEq total) by  mouth 2 (two) times daily. 10/14/15  Yes Darlin Coco, MD  temazepam (RESTORIL) 30 MG capsule Take 1 capsule (30 mg total) by mouth at bedtime. 05/27/15  Yes Darlin Coco, MD  traMADol (ULTRAM) 50 MG tablet Take 50 mg by mouth every 6 (six) hours as needed for moderate pain.   Yes Historical Provider, MD  docusate sodium (COLACE) 50 MG capsule Take 50 mg by mouth as needed for mild constipation.     Historical Provider, MD  isosorbide mononitrate (IMDUR) 60 MG 24 hr tablet Take 0.5 tablets (30 mg total) by mouth at bedtime. 10/14/15   Darlin Coco, MD  loratadine (CLARITIN) 10 MG tablet Take 10 mg by mouth daily as needed for allergies (for allergies).    Historical Provider, MD   BP 133/77 mmHg  Pulse 74  Temp(Src) 98.1 F (36.7 C) (Oral)  Resp 13  Ht 5\' 3"  (1.6 m)  Wt 108 lb (48.988 kg)  BMI 19.14 kg/m2  SpO2 96% Physical Exam  Constitutional: She appears well-developed and well-nourished. No distress.  HENT:  Head: Normocephalic and atraumatic.  Mouth/Throat: Oropharynx is clear and moist. No oropharyngeal exudate.  Eyes: Conjunctivae and EOM are normal. Pupils are equal, round, and reactive to light. Right eye exhibits no discharge. Left eye exhibits no discharge. No scleral icterus.  Neck: Normal range of motion. Neck supple. No JVD present. No thyromegaly present.  Cardiovascular: Normal rate, regular rhythm, normal heart sounds and intact distal pulses.  Exam reveals no gallop and no friction rub.   No murmur heard. Pulmonary/Chest: Effort normal and breath sounds normal. No respiratory distress. She has no wheezes. She has no rales.  Abdominal: Soft. Bowel sounds are normal. She exhibits no distension and no mass. There is no tenderness.  Musculoskeletal: Normal range of motion. She exhibits edema (slight bilateral lower extremity edema, symmetrical). She exhibits no tenderness.  Lymphadenopathy:    She has no cervical adenopathy.  Neurological: She is alert.  Coordination normal.  Skin: Skin is warm and dry. No rash noted. No erythema.  Psychiatric: She has a normal mood and affect. Her behavior is normal.  Nursing note and vitals reviewed.   ED Course  Procedures (including critical care time) Labs Review Labs Reviewed  CBC - Abnormal; Notable for the following:    Platelets 148 (*)    All other components within normal limits  COMPREHENSIVE METABOLIC PANEL - Abnormal; Notable for the following:    Chloride 99 (*)    CO2 33 (*)    Glucose, Bld 112 (*)    BUN 23 (*)    Creatinine, Ser 1.20 (*)    Calcium 8.8 (*)    Total Protein 6.3 (*)    GFR calc non Af Amer 39 (*)  GFR calc Af Amer 46 (*)    All other components within normal limits  APTT  PROTIME-INR  TROPONIN I  TROPONIN I    Imaging Review Dg Chest 2 View  11/16/2015  CLINICAL DATA:  Chest pain EXAM: CHEST  2 VIEW COMPARISON:  05/16/2015 FINDINGS: Sternotomy wires overlies normal cardiac silhouette. There is chronic bronchitic markings centrally. There is no focal infiltrate. No pneumothorax. Improved aeration lung bases compared to 7/ 16. IMPRESSION: Chronic interstitial lung disease not changed from prior. No clear acute findings. Electronically Signed   By: Suzy Bouchard M.D.   On: 11/16/2015 17:03   I have personally reviewed and evaluated these images and lab results as part of my medical decision-making.   EKG Interpretation   Date/Time:  Saturday November 16 2015 15:49:51 EST Ventricular Rate:  73 PR Interval:  154 QRS Duration: 101 QT Interval:  417 QTC Calculation: 459 R Axis:   48 Text Interpretation:  Sinus rhythm Abnormal R-wave progression, early  transition LVH with secondary repolarization abnormality Since last  tracing rate faster Confirmed by Gaylia Kassel  MD, Chenelle Benning ([id number]) on 11/16/2015  3:58:55 PM      MDM   Final diagnoses:  Chest pain, unspecified chest pain type    The last time that the patient was admitted to the hospital she was seen  by cardiology and it was determined that the patient engage in further medical therapy but did not seem appropriate for interventional therapy. At this time the patient is essentially pain-free, EKG shows left ventricular hypertrophy with repolarization abnormalities, this is very consistent with prior EKGs. We'll obtain a troponin, labs, chest x-ray, she will probably need a second troponin to rule out any change. She is alert he had aspirin prior to arrival by home and EMS.  The pt has normal trop X 2 - has no sx since arrival - every time I ask if she has sx she state she is doing well and has NO Chest Pain.  Her VS have remained normal - her trop has been neg X 2 - xray and other labs unremarkable as well.  She has a Cr of 1.2 - baseline is 1.0 - will have her check this outpt in f/u - she is in agreement.  Noemi Chapel, MD 11/16/15 2140

## 2015-11-16 NOTE — ED Notes (Signed)
Pt unable to obtain ride home till morning.  Will continue to monitor in department till then per [facility]

## 2015-11-16 NOTE — ED Notes (Signed)
Left message with emergency contact to call ED.

## 2015-11-16 NOTE — Discharge Instructions (Signed)

## 2015-11-17 NOTE — ED Notes (Addendum)
Identified home health agency from a discharge note in the computer 05/2015. Spoke with on-call director who provided an additional number for emergency contact "[name]" [phone]. Number disconnected.   7:52 AM L/M additional message for emergency contact "[name]" number on file. Voice message states "Leave a message for [name]".  Pt states "[name] passed away 2 years ago".  He is listed as her emergency contact.   8:19 AM Called Adult [employer] [phone]) after patient states "Last time I took a taxi home, the drive kept trying to come in the house." Also when asked if there was anyone elSpoke with Billey Co from Adult Protective services. She will contact her supervisor and call us back.   8:30 AM [facility] [phone] "Deputy [place]" notified for assistance with taking patient home as well as safety check of her home environment. He states his supervisor will send someone to pick her up, take her home and perform a safety check on her environment.   8:35 AM Pt's niece [name] called the ED.  States she was first notified at 6:30 this morning that her aunt was in the ED but she did not call us back. She stated she is in no hurry to come pick pt up because she has to feed her cats first. She said it will take her a few more hours to get here.  Clarified with caller that she has been aware for 2 hours that her aunt is needing to be picked up and it will still take her 3 or more hours to come to ED, she agreed. When asked how often she checks in on [name], she said she calls her on Saturdays. Patient updated.   9:34 AM Pt escorted with Sheriff's Deputy [name] home and for safety check.   9:36 AM [name] called the ED to speak with me. Stated he did not appreciate me calling his wife and asking her to come pick Gloria. [name] up. Attempted to clarify what exactly he did not appreciate, he stated "how dare you  question my wife about how long it would take her to come pick [name] up. [facility] are a [facility]. Its your job to watch her regardless of how long it takes [place] to come pick her up you fuc_ing _itch. I will have your _od _amn job. My wife is on her way up there now. You are going to regret every calling us."  He hung up the phone as I tried to explain the [facility] has already escorted her home.   9:39 AM I called him back and left a message that [name] has already been escorted home by S. Officers.   Charge nurse aware of the threat made by Gloria. [name]

## 2015-11-17 NOTE — ED Notes (Signed)
[name] [phone] left msg for her to call here [phone] left msg

## 2015-11-19 ENCOUNTER — Other Ambulatory Visit: Payer: Self-pay | Admitting: Cardiology

## 2015-11-19 DIAGNOSIS — F419 Anxiety disorder, unspecified: Secondary | ICD-10-CM

## 2015-11-19 DIAGNOSIS — G4700 Insomnia, unspecified: Secondary | ICD-10-CM

## 2015-11-19 NOTE — Telephone Encounter (Signed)
Follow up       *STAT* If patient is at the pharmacy, call can be transferred to refill team.   1. Which medications need to be refilled? (please list name of each medication and dose if known) alprazolam 0.25 and temazepam 30mg   2. Which pharmacy/location (including street and city if local pharmacy) is medication to be sent to? [facility] [place] [facility]. Do they need a 30 day or 90 day supply? 30 day supply

## 2015-11-21 ENCOUNTER — Telehealth: Payer: Self-pay | Admitting: Cardiology

## 2015-11-21 NOTE — Telephone Encounter (Signed)
New Message   *STAT* If patient is at the pharmacy, call can be transferred to refill team.  Pt niece requested to be called when refill is sent in order to follow up. Please call back and discuss.    1. Which medications need to be refilled? (please list name of each medication and dose if known) temazepam 30 mg, xanax .25mg    2. Which pharmacy/location (including street and city if local pharmacy) is medication to be sent to?[facility] [place] [address]  3. Do they need a 30 day or 90 day supply? 30 day

## 2015-11-21 NOTE — Telephone Encounter (Signed)
Refilled Rx's as requested Advised additional refills from PCP

## 2015-12-17 ENCOUNTER — Encounter: Payer: Self-pay | Admitting: *Deleted

## 2015-12-18 ENCOUNTER — Ambulatory Visit (INDEPENDENT_AMBULATORY_CARE_PROVIDER_SITE_OTHER): Payer: Medicare Other | Admitting: Cardiovascular Disease

## 2015-12-18 ENCOUNTER — Encounter: Payer: Self-pay | Admitting: Cardiovascular Disease

## 2015-12-18 VITALS — BP 129/83 | HR 63 | Ht 63.0 in

## 2015-12-18 DIAGNOSIS — I5043 Acute on chronic combined systolic (congestive) and diastolic (congestive) heart failure: Secondary | ICD-10-CM | POA: Diagnosis not present

## 2015-12-18 DIAGNOSIS — Z79899 Other long term (current) drug therapy: Secondary | ICD-10-CM

## 2015-12-18 DIAGNOSIS — E038 Other specified hypothyroidism: Secondary | ICD-10-CM | POA: Diagnosis not present

## 2015-12-18 DIAGNOSIS — I481 Persistent atrial fibrillation: Secondary | ICD-10-CM | POA: Diagnosis not present

## 2015-12-18 DIAGNOSIS — I519 Heart disease, unspecified: Secondary | ICD-10-CM

## 2015-12-18 DIAGNOSIS — Z9289 Personal history of other medical treatment: Secondary | ICD-10-CM

## 2015-12-18 DIAGNOSIS — I25768 Atherosclerosis of bypass graft of coronary artery of transplanted heart with other forms of angina pectoris: Secondary | ICD-10-CM

## 2015-12-18 DIAGNOSIS — Z87898 Personal history of other specified conditions: Secondary | ICD-10-CM

## 2015-12-18 DIAGNOSIS — I4819 Other persistent atrial fibrillation: Secondary | ICD-10-CM

## 2015-12-18 MED ORDER — FUROSEMIDE 40 MG PO TABS: 40.0000 mg | ORAL_TABLET | Freq: Every day | ORAL | Status: DC

## 2015-12-18 NOTE — Progress Notes (Signed)
Patient ID: [name], female   DOB: 02-18-1926, 82 y.o.   MRN: [id number]      SUBJECTIVE: The patient presents for posthospitalization follow-up from [facility]. She has a history of ischemic heart disease status post CABG in 1989 with a redo CABG in 2000. She has a history of paroxysmal atrial fibrillation and had not been on anticoagulation because of falls risk. She previously developed hypothyroidism while on amiodarone. She was evaluated by Dr. Mare Ferrari in the office on 10/14/15.  She was hospitalized in January 2017 with rapid atrial fibrillation and pulmonary edema. She also had a community-acquired pneumonia.   She was discharged on Xarelto, Toprol-XL 25 mg twice daily, Lasix 40 mg daily, digoxin 0.125 mg every 48 hours, losartan 25 mg daily, aspirin 81 mg daily, and amiodarone 200 mg daily.   Troponin levels were normal. I reviewed all relevant labs and documentation pertaining to this hospitalization. N-terminal proBNP elevated at 26,075. Hemoglobin 10.4.   ECG on 12/15/15 showed sinus rhythm with LVH and repolarization abnormalities. ECG on 1/30 showed rapid atrial fibrillation, heart rate 182 bpm.  Transthoracic ehocardiogram performed at [facility] on 12/04/15 showed severely reduced left ventricular systolic function, EF 99991111, anteroseptal and apical septal akinesis with severe diffuse hypokinesis, mild left atrial enlargement, moderate to severe mitral regurgitation, moderate tricuspid regurgitation, mild aortic regurgitation, and moderately elevated pulmonary pressures of 40-50 mmHg.  Transesophageal echocardiogram on 12/10/15 showed mild left ventricular dilatation, EF 25-30%, mild left atrial dilatation, no thrombus in the left atrial appendage, mild mitral regurgitation, and trivial tricuspid regurgitation.  She is confused and cannot provide any history.  Review of Systems: Nearly positive for all symptoms.  Allergies  Allergen Reactions  . Amiodarone    Hypothyroidism    Current Outpatient Prescriptions  Medication Sig Dispense Refill  . albuterol (PROVENTIL) (2.5 MG/3ML) 0.083% nebulizer solution Take 2.5 mg by nebulization every 6 (six) hours as needed for wheezing or shortness of breath.    . ALPRAZolam (XANAX) 0.25 MG tablet Take 0.25 mg by mouth 2 (two) times daily as needed for anxiety.    Marland Kitchen amiodarone (PACERONE) 200 MG tablet Take 200 mg by mouth daily.    Marland Kitchen aspirin EC 81 MG tablet Take 81 mg by mouth daily.    Marland Kitchen dextromethorphan-guaiFENesin (ROBITUSSIN-DM) 10-100 MG/5ML liquid Take 10 mLs by mouth every 4 (four) hours as needed for cough.    . digoxin (LANOXIN) 0.125 MG tablet Take 0.125 mg by mouth every other day.    . Hypromellose (ARTIFICIAL TEARS OP) Apply 2 drops to eye as needed.    . levalbuterol (XOPENEX) 0.63 MG/3ML nebulizer solution Take 0.63 mg by nebulization every 6 (six) hours as needed for wheezing or shortness of breath.    . losartan (COZAAR) 25 MG tablet Take 25 mg by mouth daily.    . metoprolol tartrate (LOPRESSOR) 25 MG tablet Take 25 mg by mouth 2 (two) times daily.    Marland Kitchen nystatin (MYCOSTATIN) 100000 UNIT/ML suspension Take 500 mLs by mouth 4 (four) times daily.    Marland Kitchen omeprazole (PRILOSEC) 20 MG capsule Take 20 mg by mouth daily.    . potassium chloride SA (K-DUR,KLOR-CON) 20 MEQ tablet Take 20 mEq by mouth daily.    . rivaroxaban (XARELTO) 20 MG TABS tablet Take 20 mg by mouth daily with supper.     No current facility-administered medications for this visit.    Past Medical History  Diagnosis Date  . Stroke ([place])   . Coronary artery disease  remote CABG in 1989 and redo in 2007; has residual stable angina  . Hypertension   . Hyperlipidemia   . PAF (paroxysmal atrial fibrillation) ([place])   . High risk medication use     on amiodarone  . Systolic and diastolic CHF, chronic ([place])     EF 45% per echo in 2012  . Anemia   . Depression   . Anxiety   . Macular degeneration   . Chronic anxiety   .  Hypothyroidism 05/29/2012    Past Surgical History  Procedure Laterality Date  . Coronary artery bypass graft      1989 with redo 2007 1. Redo median sternotomy, extracorporeal circulation, redo coronary  . Abdominal hysterectomy    . Cardiac surgery      Social History   Social History  . Marital Status: Widowed    Spouse Name: N/A  . Number of Children: N/A  . Years of Education: N/A   Occupational History  . Not on file.   Social History Main Topics  . Smoking status: Former Smoker -- 0.25 packs/day for 2 years    Types: Cigarettes    Start date: 12/12/1945    Quit date: 12/13/1947  . Smokeless tobacco: Never Used  . Alcohol Use: No  . Drug Use: No  . Sexual Activity: No   Other Topics Concern  . Not on file   Social History Narrative     Filed Vitals:   12/18/15 1427  BP: 129/83  Pulse: 63  Height: 5\' 3"  (1.6 m)  SpO2: 100%    PHYSICAL EXAM General: NAD HEENT: Normal. Neck: +JVD no thyromegaly. Lungs: Diminished throughout, no wheezes. CV: Nondisplaced PMI.  Regular rate and rhythm, normal S1/S2, no XX123456, 3/6 pansystolic murmur along left sternal border. No pretibial or periankle edema.   Abdomen: Soft, nontender, no distention.  Neurologic: Alert. Confused.   ECG: Most recent ECG reviewed.      ASSESSMENT AND PLAN: 1. CAD with CABG x 2: On ASA and metoprolol tartrate. No longer on Lipitor for unclear reasons.  2. Persistent atrial fibrillation: Now back on amiodarone and now anticoagulated with Xarelto. Given prior h/o hypothyroidism, will need TSH monitoring. Will check TSH and LFT's in one month.  3. Chronic systolic heart failure: Not on Lasix. Will start 40 mg daily and check BMET in one week. Continue Toprol-XL and digoxin along with losartan.  Dispo: f/u 4 months with APP.  Time spent: 40 minutes, of which greater than 50% was spent reviewing symptoms, relevant blood tests and studies, and discussing management plan with the  patient.   Kate Sable, M.D., F.A.C.C.

## 2015-12-18 NOTE — Patient Instructions (Signed)
   Begin Lasix 40mg  daily. Continue all other medications.   Labs for BMET - due in 1 week Labs for TSH, LFT - due in 1 month  Office will contact with results via phone or letter.   Follow up in  4 months with Jory Sims, NP in our [address] office.

## 2015-12-20 ENCOUNTER — Encounter: Payer: Medicare Other | Admitting: Cardiovascular Disease

## 2015-12-25 ENCOUNTER — Other Ambulatory Visit: Payer: Self-pay | Admitting: Cardiology

## 2016-02-29 DIAGNOSIS — I5022 Chronic systolic (congestive) heart failure: Secondary | ICD-10-CM | POA: Diagnosis not present

## 2016-03-02 ENCOUNTER — Telehealth: Payer: Self-pay | Admitting: *Deleted

## 2016-03-02 DIAGNOSIS — I110 Hypertensive heart disease with heart failure: Secondary | ICD-10-CM | POA: Diagnosis not present

## 2016-03-02 DIAGNOSIS — J449 Chronic obstructive pulmonary disease, unspecified: Secondary | ICD-10-CM | POA: Diagnosis not present

## 2016-03-02 DIAGNOSIS — I25700 Atherosclerosis of coronary artery bypass graft(s), unspecified, with unstable angina pectoris: Secondary | ICD-10-CM | POA: Diagnosis not present

## 2016-03-02 DIAGNOSIS — E876 Hypokalemia: Secondary | ICD-10-CM | POA: Diagnosis not present

## 2016-03-02 DIAGNOSIS — Z951 Presence of aortocoronary bypass graft: Secondary | ICD-10-CM | POA: Diagnosis not present

## 2016-03-02 DIAGNOSIS — Z87891 Personal history of nicotine dependence: Secondary | ICD-10-CM | POA: Diagnosis not present

## 2016-03-02 DIAGNOSIS — I5021 Acute systolic (congestive) heart failure: Secondary | ICD-10-CM | POA: Diagnosis not present

## 2016-03-02 DIAGNOSIS — I480 Paroxysmal atrial fibrillation: Secondary | ICD-10-CM | POA: Diagnosis not present

## 2016-03-02 NOTE — Telephone Encounter (Signed)
Magda Paganini w/[facility] says will fax orders for Dr. Bronson Ing to sign for cardiopulmonary home health

## 2016-03-03 ENCOUNTER — Other Ambulatory Visit: Payer: Self-pay | Admitting: *Deleted

## 2016-03-03 ENCOUNTER — Telehealth: Payer: Self-pay | Admitting: Cardiovascular Disease

## 2016-03-03 DIAGNOSIS — Z87891 Personal history of nicotine dependence: Secondary | ICD-10-CM | POA: Diagnosis not present

## 2016-03-03 DIAGNOSIS — I5021 Acute systolic (congestive) heart failure: Secondary | ICD-10-CM | POA: Diagnosis not present

## 2016-03-03 DIAGNOSIS — I480 Paroxysmal atrial fibrillation: Secondary | ICD-10-CM | POA: Diagnosis not present

## 2016-03-03 DIAGNOSIS — I110 Hypertensive heart disease with heart failure: Secondary | ICD-10-CM | POA: Diagnosis not present

## 2016-03-03 DIAGNOSIS — I25700 Atherosclerosis of coronary artery bypass graft(s), unspecified, with unstable angina pectoris: Secondary | ICD-10-CM | POA: Diagnosis not present

## 2016-03-03 DIAGNOSIS — J449 Chronic obstructive pulmonary disease, unspecified: Secondary | ICD-10-CM | POA: Diagnosis not present

## 2016-03-03 DIAGNOSIS — E876 Hypokalemia: Secondary | ICD-10-CM | POA: Diagnosis not present

## 2016-03-03 DIAGNOSIS — Z951 Presence of aortocoronary bypass graft: Secondary | ICD-10-CM | POA: Diagnosis not present

## 2016-03-03 MED ORDER — AMIODARONE HCL 200 MG PO TABS: 200.0000 mg | ORAL_TABLET | Freq: Every day | ORAL | Status: DC

## 2016-03-03 NOTE — Telephone Encounter (Signed)
Anne Shutter would like someone to give her a call back concerning Ms. Thrun medications, she has a few questions.

## 2016-03-03 NOTE — Telephone Encounter (Signed)
Ms. Redmond Pulling says Edd Fabian had returned call and addressed medication questions.

## 2016-03-04 NOTE — Telephone Encounter (Signed)
Spoke with [name] regarding medications.  Just recently came home from nursing center.  Refilled Amiodarone till we see patient again in office.  Multiple questions concerning her non cardiac medications.  Explained to her that we typically do not manage thyroid or antidepressant medications here.  She will need to establish with PMD for this.  Next appointment scheduled for 03/12/2016 with Dr. Bronson Ing.  Advised her to bring all medications to this visit.  Verbalized understanding.

## 2016-03-05 DIAGNOSIS — J449 Chronic obstructive pulmonary disease, unspecified: Secondary | ICD-10-CM | POA: Diagnosis not present

## 2016-03-05 DIAGNOSIS — I25700 Atherosclerosis of coronary artery bypass graft(s), unspecified, with unstable angina pectoris: Secondary | ICD-10-CM | POA: Diagnosis not present

## 2016-03-05 DIAGNOSIS — I110 Hypertensive heart disease with heart failure: Secondary | ICD-10-CM | POA: Diagnosis not present

## 2016-03-05 DIAGNOSIS — Z951 Presence of aortocoronary bypass graft: Secondary | ICD-10-CM | POA: Diagnosis not present

## 2016-03-05 DIAGNOSIS — E876 Hypokalemia: Secondary | ICD-10-CM | POA: Diagnosis not present

## 2016-03-05 DIAGNOSIS — Z87891 Personal history of nicotine dependence: Secondary | ICD-10-CM | POA: Diagnosis not present

## 2016-03-05 DIAGNOSIS — I480 Paroxysmal atrial fibrillation: Secondary | ICD-10-CM | POA: Diagnosis not present

## 2016-03-05 DIAGNOSIS — I5021 Acute systolic (congestive) heart failure: Secondary | ICD-10-CM | POA: Diagnosis not present

## 2016-03-06 DIAGNOSIS — I25700 Atherosclerosis of coronary artery bypass graft(s), unspecified, with unstable angina pectoris: Secondary | ICD-10-CM | POA: Diagnosis not present

## 2016-03-06 DIAGNOSIS — J449 Chronic obstructive pulmonary disease, unspecified: Secondary | ICD-10-CM | POA: Diagnosis not present

## 2016-03-06 DIAGNOSIS — Z87891 Personal history of nicotine dependence: Secondary | ICD-10-CM | POA: Diagnosis not present

## 2016-03-06 DIAGNOSIS — I5021 Acute systolic (congestive) heart failure: Secondary | ICD-10-CM | POA: Diagnosis not present

## 2016-03-06 DIAGNOSIS — Z951 Presence of aortocoronary bypass graft: Secondary | ICD-10-CM | POA: Diagnosis not present

## 2016-03-06 DIAGNOSIS — I110 Hypertensive heart disease with heart failure: Secondary | ICD-10-CM | POA: Diagnosis not present

## 2016-03-06 DIAGNOSIS — I480 Paroxysmal atrial fibrillation: Secondary | ICD-10-CM | POA: Diagnosis not present

## 2016-03-06 DIAGNOSIS — E876 Hypokalemia: Secondary | ICD-10-CM | POA: Diagnosis not present

## 2016-03-11 DIAGNOSIS — I110 Hypertensive heart disease with heart failure: Secondary | ICD-10-CM | POA: Diagnosis not present

## 2016-03-11 DIAGNOSIS — I25700 Atherosclerosis of coronary artery bypass graft(s), unspecified, with unstable angina pectoris: Secondary | ICD-10-CM | POA: Diagnosis not present

## 2016-03-11 DIAGNOSIS — I5021 Acute systolic (congestive) heart failure: Secondary | ICD-10-CM | POA: Diagnosis not present

## 2016-03-11 DIAGNOSIS — Z951 Presence of aortocoronary bypass graft: Secondary | ICD-10-CM | POA: Diagnosis not present

## 2016-03-11 DIAGNOSIS — E876 Hypokalemia: Secondary | ICD-10-CM | POA: Diagnosis not present

## 2016-03-11 DIAGNOSIS — J449 Chronic obstructive pulmonary disease, unspecified: Secondary | ICD-10-CM | POA: Diagnosis not present

## 2016-03-11 DIAGNOSIS — Z87891 Personal history of nicotine dependence: Secondary | ICD-10-CM | POA: Diagnosis not present

## 2016-03-11 DIAGNOSIS — I480 Paroxysmal atrial fibrillation: Secondary | ICD-10-CM | POA: Diagnosis not present

## 2016-03-12 ENCOUNTER — Encounter: Payer: Self-pay | Admitting: Cardiovascular Disease

## 2016-03-12 ENCOUNTER — Ambulatory Visit (INDEPENDENT_AMBULATORY_CARE_PROVIDER_SITE_OTHER): Payer: Medicare Other | Admitting: Cardiovascular Disease

## 2016-03-12 VITALS — BP 130/64 | HR 64 | Ht 63.0 in | Wt 92.0 lb

## 2016-03-12 DIAGNOSIS — I481 Persistent atrial fibrillation: Secondary | ICD-10-CM | POA: Diagnosis not present

## 2016-03-12 DIAGNOSIS — Z79899 Other long term (current) drug therapy: Secondary | ICD-10-CM | POA: Diagnosis not present

## 2016-03-12 DIAGNOSIS — E7800 Pure hypercholesterolemia, unspecified: Secondary | ICD-10-CM

## 2016-03-12 DIAGNOSIS — I480 Paroxysmal atrial fibrillation: Secondary | ICD-10-CM | POA: Diagnosis not present

## 2016-03-12 DIAGNOSIS — I25768 Atherosclerosis of bypass graft of coronary artery of transplanted heart with other forms of angina pectoris: Secondary | ICD-10-CM | POA: Diagnosis not present

## 2016-03-12 DIAGNOSIS — I4819 Other persistent atrial fibrillation: Secondary | ICD-10-CM

## 2016-03-12 DIAGNOSIS — I519 Heart disease, unspecified: Secondary | ICD-10-CM

## 2016-03-12 DIAGNOSIS — I5022 Chronic systolic (congestive) heart failure: Secondary | ICD-10-CM | POA: Diagnosis not present

## 2016-03-12 DIAGNOSIS — E039 Hypothyroidism, unspecified: Secondary | ICD-10-CM

## 2016-03-12 MED ORDER — AMIODARONE HCL 100 MG PO TABS: 100.0000 mg | ORAL_TABLET | Freq: Every day | ORAL | Status: DC

## 2016-03-12 MED ORDER — METOPROLOL SUCCINATE ER 25 MG PO TB24: 25.0000 mg | ORAL_TABLET | Freq: Every day | ORAL | Status: DC

## 2016-03-12 NOTE — Addendum Note (Signed)
Addended by: Laurine Blazer on: 03/12/2016 12:27 PM   Modules accepted: Orders

## 2016-03-12 NOTE — Patient Instructions (Signed)
   Labs for TSH, Liver function - orders given today. Office will contact with results via phone or letter.   Finish current supply of Lopressor. Begin Toprol XL 25mg  daily - printed script given today.   Continue all other medications.   Your physician wants you to follow up in: 6 months.  You will receive a reminder letter in the mail one-two months in advance.  If you don't receive a letter, please call our office to schedule the follow up appointment

## 2016-03-12 NOTE — Progress Notes (Signed)
Patient ID: [name], female   DOB: [birth year], 82 y.o.   MRN: [id number]      SUBJECTIVE: The patient presents for routine follow-up. She has a history of ischemic heart disease status post CABG in 1989 with a redo CABG in 2000. She has a history of paroxysmal atrial fibrillation and had not been on anticoagulation in the past because of falls risk. She previously developed hypothyroidism while on amiodarone.  She was hospitalized in January 2017 with rapid atrial fibrillation and pulmonary edema. She also had a community-acquired pneumonia.   She was discharged on Xarelto, Toprol-XL 25 mg twice daily, Lasix 40 mg daily, digoxin 0.125 mg every 48 hours, losartan 25 mg daily, aspirin 81 mg daily, and amiodarone 200 mg daily.   Transthoracic ehocardiogram performed at [facility] on 12/04/15 showed severely reduced left ventricular systolic function, EF 99991111, anteroseptal and apical septal akinesis with severe diffuse hypokinesis, mild left atrial enlargement, moderate to severe mitral regurgitation, moderate tricuspid regurgitation, mild aortic regurgitation, and moderately elevated pulmonary pressures of 40-50 mmHg.  Transesophageal echocardiogram on 12/10/15 showed mild left ventricular dilatation, EF 25-30%, mild left atrial dilatation, no thrombus in the left atrial appendage, mild mitral regurgitation, and trivial tricuspid regurgitation.  Wt 92 lbs  She has since stayed at the [facility] and is now residing at home. She is here with her daughter. Several medications have been discontinued and changed since I last saw her. Toprol XL was switched to Lopressor once daily. She is no longer on aspirin, digoxin, nor Xarelto. Her daughter thinks she may have needed a blood transfusion but cannot remember when.  Patient had self-limiting chest pain yesterday. Did not require nitroglycerin.   Review of Systems: As per "subjective", otherwise negative.  No Known  Allergies  Current Outpatient Prescriptions  Medication Sig Dispense Refill  . ALPRAZolam (XANAX) 0.25 MG tablet Take 0.25 mg by mouth 2 (two) times daily.     Marland Kitchen amiodarone (PACERONE) 100 MG tablet Take 100 mg by mouth daily.    . furosemide (LASIX) 40 MG tablet Take 1 tablet (40 mg total) by mouth daily.    . Hypromellose (ARTIFICIAL TEARS OP) Apply 2 drops to eye as needed.    Marland Kitchen levothyroxine (SYNTHROID, LEVOTHROID) 25 MCG tablet Take 25 mcg by mouth daily before breakfast.    . losartan (COZAAR) 25 MG tablet Take 25 mg by mouth daily.    . metoprolol tartrate (LOPRESSOR) 25 MG tablet Take 25 mg by mouth daily.     . mirtazapine (REMERON) 7.5 MG tablet Take 7.5 mg by mouth at bedtime.    . nitroGLYCERIN (NITROSTAT) 0.4 MG SL tablet DISSOLVE 1 TABLET UNDER TONGUE EVERY 5 MINUTES AS DIRECTED AS NEEDED FOR CHEST PAIN 25 tablet 3  . omeprazole (PRILOSEC) 20 MG capsule Take 20 mg by mouth daily.    . potassium chloride SA (K-DUR,KLOR-CON) 20 MEQ tablet Take 20 mEq by mouth daily.    . sertraline (ZOLOFT) 25 MG tablet Take 25 mg by mouth daily.     No current facility-administered medications for this visit.    Past Medical History  Diagnosis Date  . Stroke ([place])   . Coronary artery disease     remote CABG in 1989 and redo in 2007; has residual stable angina  . Hypertension   . Hyperlipidemia   . PAF (paroxysmal atrial fibrillation) ([place])   . High risk medication use     on amiodarone  . Systolic and diastolic CHF, chronic ([place])  EF 45% per echo in 2012  . Anemia   . Depression   . Anxiety   . Macular degeneration   . Chronic anxiety   . Hypothyroidism 05/29/2012    Past Surgical History  Procedure Laterality Date  . Coronary artery bypass graft      1989 with redo 2007 1. Redo median sternotomy, extracorporeal circulation, redo coronary  . Abdominal hysterectomy    . Cardiac surgery      Social History   Social History  . Marital Status: Widowed    Spouse Name: N/A   . Number of Children: N/A  . Years of Education: N/A   Occupational History  . Not on file.   Social History Main Topics  . Smoking status: Former Smoker -- 0.25 packs/day for 2 years    Types: Cigarettes    Start date: 12/12/1945    Quit date: 12/13/1947  . Smokeless tobacco: Never Used  . Alcohol Use: No  . Drug Use: No  . Sexual Activity: No   Other Topics Concern  . Not on file   Social History Narrative     Filed Vitals:   03/12/16 1133  BP: 130/64  Pulse: 64  Height: 5\' 3"  (1.6 m)  Weight: 92 lb (41.731 kg)    PHYSICAL EXAM General: NAD HEENT: Normal. Neck: No JVD, no thyromegaly. Lungs: Clear. CV: Nondisplaced PMI. Regular rate and rhythm, normal S1/S2, no XX123456, 3/6 pansystolic murmur along left sternal border. No pretibial or periankle edema.  Abdomen: Soft, nontender, no distention.  Neurologic: Alert and oriented.   ECG: Most recent ECG reviewed.      ASSESSMENT AND PLAN: 1. CAD with CABG x 2: No longer on ASA. Taking metoprolol tartrate. No longer on Lipitor for unclear reasons. Will switch metoprolol tartrate to succinate given her cardiomyopathy and CHF.  2. Persistent atrial fibrillation: No longer on Xarelto. On amiodarone 100 mg daily. Will check TSH and LFT's given prior h/o hypothyroidism.  3. Chronic systolic heart failure: Euvolemic on Lasix 40 mg. Continue metoprolol (I had started metoprolol succinate, but it was switched by someone else to tartrate). I will switch back to succinate 25 mg daily for reasons mentioned above. Also no longer on digoxin. Taking losartan.  Dispo: f/u 6 months.   Kate Sable, M.D., F.A.C.C.

## 2016-03-13 ENCOUNTER — Telehealth: Payer: Self-pay | Admitting: *Deleted

## 2016-03-13 DIAGNOSIS — E876 Hypokalemia: Secondary | ICD-10-CM | POA: Diagnosis not present

## 2016-03-13 DIAGNOSIS — J449 Chronic obstructive pulmonary disease, unspecified: Secondary | ICD-10-CM | POA: Diagnosis not present

## 2016-03-13 DIAGNOSIS — I5021 Acute systolic (congestive) heart failure: Secondary | ICD-10-CM | POA: Diagnosis not present

## 2016-03-13 DIAGNOSIS — Z87891 Personal history of nicotine dependence: Secondary | ICD-10-CM | POA: Diagnosis not present

## 2016-03-13 DIAGNOSIS — I480 Paroxysmal atrial fibrillation: Secondary | ICD-10-CM | POA: Diagnosis not present

## 2016-03-13 DIAGNOSIS — Z951 Presence of aortocoronary bypass graft: Secondary | ICD-10-CM | POA: Diagnosis not present

## 2016-03-13 DIAGNOSIS — I110 Hypertensive heart disease with heart failure: Secondary | ICD-10-CM | POA: Diagnosis not present

## 2016-03-13 DIAGNOSIS — I25700 Atherosclerosis of coronary artery bypass graft(s), unspecified, with unstable angina pectoris: Secondary | ICD-10-CM | POA: Diagnosis not present

## 2016-03-13 NOTE — Telephone Encounter (Signed)
Notes Recorded by Laurine Blazer, LPN on [id number] at [id number] AM Patient notified via voice mail. Copy to pmd.  Notes Recorded by Herminio Commons, MD on 03/12/2016 at 4:19 PM Normal.

## 2016-03-16 DIAGNOSIS — I5021 Acute systolic (congestive) heart failure: Secondary | ICD-10-CM | POA: Diagnosis not present

## 2016-03-16 DIAGNOSIS — I480 Paroxysmal atrial fibrillation: Secondary | ICD-10-CM | POA: Diagnosis not present

## 2016-03-16 DIAGNOSIS — I25700 Atherosclerosis of coronary artery bypass graft(s), unspecified, with unstable angina pectoris: Secondary | ICD-10-CM | POA: Diagnosis not present

## 2016-03-16 DIAGNOSIS — Z87891 Personal history of nicotine dependence: Secondary | ICD-10-CM | POA: Diagnosis not present

## 2016-03-16 DIAGNOSIS — E876 Hypokalemia: Secondary | ICD-10-CM | POA: Diagnosis not present

## 2016-03-16 DIAGNOSIS — Z951 Presence of aortocoronary bypass graft: Secondary | ICD-10-CM | POA: Diagnosis not present

## 2016-03-16 DIAGNOSIS — J449 Chronic obstructive pulmonary disease, unspecified: Secondary | ICD-10-CM | POA: Diagnosis not present

## 2016-03-16 DIAGNOSIS — I110 Hypertensive heart disease with heart failure: Secondary | ICD-10-CM | POA: Diagnosis not present

## 2016-03-17 DIAGNOSIS — E876 Hypokalemia: Secondary | ICD-10-CM | POA: Diagnosis not present

## 2016-03-17 DIAGNOSIS — I110 Hypertensive heart disease with heart failure: Secondary | ICD-10-CM | POA: Diagnosis not present

## 2016-03-17 DIAGNOSIS — Z951 Presence of aortocoronary bypass graft: Secondary | ICD-10-CM | POA: Diagnosis not present

## 2016-03-17 DIAGNOSIS — I5021 Acute systolic (congestive) heart failure: Secondary | ICD-10-CM | POA: Diagnosis not present

## 2016-03-17 DIAGNOSIS — I25700 Atherosclerosis of coronary artery bypass graft(s), unspecified, with unstable angina pectoris: Secondary | ICD-10-CM | POA: Diagnosis not present

## 2016-03-17 DIAGNOSIS — Z87891 Personal history of nicotine dependence: Secondary | ICD-10-CM | POA: Diagnosis not present

## 2016-03-17 DIAGNOSIS — I480 Paroxysmal atrial fibrillation: Secondary | ICD-10-CM | POA: Diagnosis not present

## 2016-03-17 DIAGNOSIS — J449 Chronic obstructive pulmonary disease, unspecified: Secondary | ICD-10-CM | POA: Diagnosis not present

## 2016-03-18 DIAGNOSIS — J449 Chronic obstructive pulmonary disease, unspecified: Secondary | ICD-10-CM | POA: Diagnosis not present

## 2016-03-18 DIAGNOSIS — Z951 Presence of aortocoronary bypass graft: Secondary | ICD-10-CM | POA: Diagnosis not present

## 2016-03-18 DIAGNOSIS — Z87891 Personal history of nicotine dependence: Secondary | ICD-10-CM | POA: Diagnosis not present

## 2016-03-18 DIAGNOSIS — I25700 Atherosclerosis of coronary artery bypass graft(s), unspecified, with unstable angina pectoris: Secondary | ICD-10-CM | POA: Diagnosis not present

## 2016-03-18 DIAGNOSIS — I5021 Acute systolic (congestive) heart failure: Secondary | ICD-10-CM | POA: Diagnosis not present

## 2016-03-18 DIAGNOSIS — I480 Paroxysmal atrial fibrillation: Secondary | ICD-10-CM | POA: Diagnosis not present

## 2016-03-18 DIAGNOSIS — E876 Hypokalemia: Secondary | ICD-10-CM | POA: Diagnosis not present

## 2016-03-18 DIAGNOSIS — I110 Hypertensive heart disease with heart failure: Secondary | ICD-10-CM | POA: Diagnosis not present

## 2016-03-19 DIAGNOSIS — C44591 Other specified malignant neoplasm of skin of breast: Secondary | ICD-10-CM | POA: Diagnosis not present

## 2016-03-19 DIAGNOSIS — I480 Paroxysmal atrial fibrillation: Secondary | ICD-10-CM | POA: Diagnosis not present

## 2016-03-19 DIAGNOSIS — I25118 Atherosclerotic heart disease of native coronary artery with other forms of angina pectoris: Secondary | ICD-10-CM | POA: Diagnosis not present

## 2016-03-19 DIAGNOSIS — K219 Gastro-esophageal reflux disease without esophagitis: Secondary | ICD-10-CM | POA: Diagnosis not present

## 2016-03-20 DIAGNOSIS — Z951 Presence of aortocoronary bypass graft: Secondary | ICD-10-CM | POA: Diagnosis not present

## 2016-03-20 DIAGNOSIS — I480 Paroxysmal atrial fibrillation: Secondary | ICD-10-CM | POA: Diagnosis not present

## 2016-03-20 DIAGNOSIS — Z87891 Personal history of nicotine dependence: Secondary | ICD-10-CM | POA: Diagnosis not present

## 2016-03-20 DIAGNOSIS — I25700 Atherosclerosis of coronary artery bypass graft(s), unspecified, with unstable angina pectoris: Secondary | ICD-10-CM | POA: Diagnosis not present

## 2016-03-20 DIAGNOSIS — I5021 Acute systolic (congestive) heart failure: Secondary | ICD-10-CM | POA: Diagnosis not present

## 2016-03-20 DIAGNOSIS — I110 Hypertensive heart disease with heart failure: Secondary | ICD-10-CM | POA: Diagnosis not present

## 2016-03-20 DIAGNOSIS — J449 Chronic obstructive pulmonary disease, unspecified: Secondary | ICD-10-CM | POA: Diagnosis not present

## 2016-03-20 DIAGNOSIS — E876 Hypokalemia: Secondary | ICD-10-CM | POA: Diagnosis not present

## 2016-03-24 DIAGNOSIS — I5021 Acute systolic (congestive) heart failure: Secondary | ICD-10-CM | POA: Diagnosis not present

## 2016-03-24 DIAGNOSIS — E876 Hypokalemia: Secondary | ICD-10-CM | POA: Diagnosis not present

## 2016-03-24 DIAGNOSIS — I25700 Atherosclerosis of coronary artery bypass graft(s), unspecified, with unstable angina pectoris: Secondary | ICD-10-CM | POA: Diagnosis not present

## 2016-03-24 DIAGNOSIS — J449 Chronic obstructive pulmonary disease, unspecified: Secondary | ICD-10-CM | POA: Diagnosis not present

## 2016-03-24 DIAGNOSIS — I110 Hypertensive heart disease with heart failure: Secondary | ICD-10-CM | POA: Diagnosis not present

## 2016-03-24 DIAGNOSIS — Z87891 Personal history of nicotine dependence: Secondary | ICD-10-CM | POA: Diagnosis not present

## 2016-03-24 DIAGNOSIS — Z951 Presence of aortocoronary bypass graft: Secondary | ICD-10-CM | POA: Diagnosis not present

## 2016-03-24 DIAGNOSIS — I480 Paroxysmal atrial fibrillation: Secondary | ICD-10-CM | POA: Diagnosis not present

## 2016-03-26 DIAGNOSIS — E876 Hypokalemia: Secondary | ICD-10-CM | POA: Diagnosis not present

## 2016-03-26 DIAGNOSIS — J449 Chronic obstructive pulmonary disease, unspecified: Secondary | ICD-10-CM | POA: Diagnosis not present

## 2016-03-26 DIAGNOSIS — I110 Hypertensive heart disease with heart failure: Secondary | ICD-10-CM | POA: Diagnosis not present

## 2016-03-26 DIAGNOSIS — I480 Paroxysmal atrial fibrillation: Secondary | ICD-10-CM | POA: Diagnosis not present

## 2016-03-26 DIAGNOSIS — Z951 Presence of aortocoronary bypass graft: Secondary | ICD-10-CM | POA: Diagnosis not present

## 2016-03-26 DIAGNOSIS — I25700 Atherosclerosis of coronary artery bypass graft(s), unspecified, with unstable angina pectoris: Secondary | ICD-10-CM | POA: Diagnosis not present

## 2016-03-26 DIAGNOSIS — Z87891 Personal history of nicotine dependence: Secondary | ICD-10-CM | POA: Diagnosis not present

## 2016-03-26 DIAGNOSIS — I5021 Acute systolic (congestive) heart failure: Secondary | ICD-10-CM | POA: Diagnosis not present

## 2016-04-02 DIAGNOSIS — Z951 Presence of aortocoronary bypass graft: Secondary | ICD-10-CM | POA: Diagnosis not present

## 2016-04-02 DIAGNOSIS — I480 Paroxysmal atrial fibrillation: Secondary | ICD-10-CM | POA: Diagnosis not present

## 2016-04-02 DIAGNOSIS — I5021 Acute systolic (congestive) heart failure: Secondary | ICD-10-CM | POA: Diagnosis not present

## 2016-04-02 DIAGNOSIS — Z87891 Personal history of nicotine dependence: Secondary | ICD-10-CM | POA: Diagnosis not present

## 2016-04-02 DIAGNOSIS — I110 Hypertensive heart disease with heart failure: Secondary | ICD-10-CM | POA: Diagnosis not present

## 2016-04-02 DIAGNOSIS — J449 Chronic obstructive pulmonary disease, unspecified: Secondary | ICD-10-CM | POA: Diagnosis not present

## 2016-04-02 DIAGNOSIS — I25700 Atherosclerosis of coronary artery bypass graft(s), unspecified, with unstable angina pectoris: Secondary | ICD-10-CM | POA: Diagnosis not present

## 2016-04-02 DIAGNOSIS — E876 Hypokalemia: Secondary | ICD-10-CM | POA: Diagnosis not present

## 2016-04-08 DIAGNOSIS — E876 Hypokalemia: Secondary | ICD-10-CM | POA: Diagnosis not present

## 2016-04-08 DIAGNOSIS — Z951 Presence of aortocoronary bypass graft: Secondary | ICD-10-CM | POA: Diagnosis not present

## 2016-04-08 DIAGNOSIS — J449 Chronic obstructive pulmonary disease, unspecified: Secondary | ICD-10-CM | POA: Diagnosis not present

## 2016-04-08 DIAGNOSIS — I25700 Atherosclerosis of coronary artery bypass graft(s), unspecified, with unstable angina pectoris: Secondary | ICD-10-CM | POA: Diagnosis not present

## 2016-04-08 DIAGNOSIS — I480 Paroxysmal atrial fibrillation: Secondary | ICD-10-CM | POA: Diagnosis not present

## 2016-04-08 DIAGNOSIS — I5021 Acute systolic (congestive) heart failure: Secondary | ICD-10-CM | POA: Diagnosis not present

## 2016-04-08 DIAGNOSIS — I110 Hypertensive heart disease with heart failure: Secondary | ICD-10-CM | POA: Diagnosis not present

## 2016-04-08 DIAGNOSIS — Z87891 Personal history of nicotine dependence: Secondary | ICD-10-CM | POA: Diagnosis not present

## 2016-04-15 DIAGNOSIS — I110 Hypertensive heart disease with heart failure: Secondary | ICD-10-CM | POA: Diagnosis not present

## 2016-04-15 DIAGNOSIS — Z951 Presence of aortocoronary bypass graft: Secondary | ICD-10-CM | POA: Diagnosis not present

## 2016-04-15 DIAGNOSIS — I25700 Atherosclerosis of coronary artery bypass graft(s), unspecified, with unstable angina pectoris: Secondary | ICD-10-CM | POA: Diagnosis not present

## 2016-04-15 DIAGNOSIS — J449 Chronic obstructive pulmonary disease, unspecified: Secondary | ICD-10-CM | POA: Diagnosis not present

## 2016-04-15 DIAGNOSIS — Z87891 Personal history of nicotine dependence: Secondary | ICD-10-CM | POA: Diagnosis not present

## 2016-04-15 DIAGNOSIS — I480 Paroxysmal atrial fibrillation: Secondary | ICD-10-CM | POA: Diagnosis not present

## 2016-04-15 DIAGNOSIS — E876 Hypokalemia: Secondary | ICD-10-CM | POA: Diagnosis not present

## 2016-04-15 DIAGNOSIS — I5021 Acute systolic (congestive) heart failure: Secondary | ICD-10-CM | POA: Diagnosis not present

## 2016-04-23 DIAGNOSIS — I25700 Atherosclerosis of coronary artery bypass graft(s), unspecified, with unstable angina pectoris: Secondary | ICD-10-CM | POA: Diagnosis not present

## 2016-04-23 DIAGNOSIS — E876 Hypokalemia: Secondary | ICD-10-CM | POA: Diagnosis not present

## 2016-04-23 DIAGNOSIS — J449 Chronic obstructive pulmonary disease, unspecified: Secondary | ICD-10-CM | POA: Diagnosis not present

## 2016-04-23 DIAGNOSIS — Z87891 Personal history of nicotine dependence: Secondary | ICD-10-CM | POA: Diagnosis not present

## 2016-04-23 DIAGNOSIS — I5021 Acute systolic (congestive) heart failure: Secondary | ICD-10-CM | POA: Diagnosis not present

## 2016-04-23 DIAGNOSIS — I480 Paroxysmal atrial fibrillation: Secondary | ICD-10-CM | POA: Diagnosis not present

## 2016-04-23 DIAGNOSIS — Z951 Presence of aortocoronary bypass graft: Secondary | ICD-10-CM | POA: Diagnosis not present

## 2016-04-23 DIAGNOSIS — I110 Hypertensive heart disease with heart failure: Secondary | ICD-10-CM | POA: Diagnosis not present

## 2016-04-27 DIAGNOSIS — I25700 Atherosclerosis of coronary artery bypass graft(s), unspecified, with unstable angina pectoris: Secondary | ICD-10-CM | POA: Diagnosis not present

## 2016-04-27 DIAGNOSIS — J449 Chronic obstructive pulmonary disease, unspecified: Secondary | ICD-10-CM | POA: Diagnosis not present

## 2016-04-27 DIAGNOSIS — I110 Hypertensive heart disease with heart failure: Secondary | ICD-10-CM | POA: Diagnosis not present

## 2016-04-27 DIAGNOSIS — I480 Paroxysmal atrial fibrillation: Secondary | ICD-10-CM | POA: Diagnosis not present

## 2016-04-27 DIAGNOSIS — E876 Hypokalemia: Secondary | ICD-10-CM | POA: Diagnosis not present

## 2016-04-27 DIAGNOSIS — I5021 Acute systolic (congestive) heart failure: Secondary | ICD-10-CM | POA: Diagnosis not present

## 2016-04-27 DIAGNOSIS — Z951 Presence of aortocoronary bypass graft: Secondary | ICD-10-CM | POA: Diagnosis not present

## 2016-04-27 DIAGNOSIS — Z87891 Personal history of nicotine dependence: Secondary | ICD-10-CM | POA: Diagnosis not present

## 2016-05-01 DIAGNOSIS — J449 Chronic obstructive pulmonary disease, unspecified: Secondary | ICD-10-CM | POA: Diagnosis not present

## 2016-05-01 DIAGNOSIS — Z87891 Personal history of nicotine dependence: Secondary | ICD-10-CM | POA: Diagnosis not present

## 2016-05-01 DIAGNOSIS — I25700 Atherosclerosis of coronary artery bypass graft(s), unspecified, with unstable angina pectoris: Secondary | ICD-10-CM | POA: Diagnosis not present

## 2016-05-01 DIAGNOSIS — I110 Hypertensive heart disease with heart failure: Secondary | ICD-10-CM | POA: Diagnosis not present

## 2016-05-01 DIAGNOSIS — I5021 Acute systolic (congestive) heart failure: Secondary | ICD-10-CM | POA: Diagnosis not present

## 2016-05-01 DIAGNOSIS — Z951 Presence of aortocoronary bypass graft: Secondary | ICD-10-CM | POA: Diagnosis not present

## 2016-05-04 DIAGNOSIS — I25700 Atherosclerosis of coronary artery bypass graft(s), unspecified, with unstable angina pectoris: Secondary | ICD-10-CM | POA: Diagnosis not present

## 2016-05-04 DIAGNOSIS — Z87891 Personal history of nicotine dependence: Secondary | ICD-10-CM | POA: Diagnosis not present

## 2016-05-04 DIAGNOSIS — J449 Chronic obstructive pulmonary disease, unspecified: Secondary | ICD-10-CM | POA: Diagnosis not present

## 2016-05-04 DIAGNOSIS — Z951 Presence of aortocoronary bypass graft: Secondary | ICD-10-CM | POA: Diagnosis not present

## 2016-05-04 DIAGNOSIS — I110 Hypertensive heart disease with heart failure: Secondary | ICD-10-CM | POA: Diagnosis not present

## 2016-05-04 DIAGNOSIS — I5021 Acute systolic (congestive) heart failure: Secondary | ICD-10-CM | POA: Diagnosis not present

## 2016-05-07 DIAGNOSIS — I25700 Atherosclerosis of coronary artery bypass graft(s), unspecified, with unstable angina pectoris: Secondary | ICD-10-CM | POA: Diagnosis not present

## 2016-05-07 DIAGNOSIS — Z951 Presence of aortocoronary bypass graft: Secondary | ICD-10-CM | POA: Diagnosis not present

## 2016-05-07 DIAGNOSIS — Z87891 Personal history of nicotine dependence: Secondary | ICD-10-CM | POA: Diagnosis not present

## 2016-05-07 DIAGNOSIS — I110 Hypertensive heart disease with heart failure: Secondary | ICD-10-CM | POA: Diagnosis not present

## 2016-05-07 DIAGNOSIS — J449 Chronic obstructive pulmonary disease, unspecified: Secondary | ICD-10-CM | POA: Diagnosis not present

## 2016-05-07 DIAGNOSIS — I5021 Acute systolic (congestive) heart failure: Secondary | ICD-10-CM | POA: Diagnosis not present

## 2016-05-12 DIAGNOSIS — J449 Chronic obstructive pulmonary disease, unspecified: Secondary | ICD-10-CM | POA: Diagnosis not present

## 2016-05-12 DIAGNOSIS — I25700 Atherosclerosis of coronary artery bypass graft(s), unspecified, with unstable angina pectoris: Secondary | ICD-10-CM | POA: Diagnosis not present

## 2016-05-12 DIAGNOSIS — I110 Hypertensive heart disease with heart failure: Secondary | ICD-10-CM | POA: Diagnosis not present

## 2016-05-12 DIAGNOSIS — Z87891 Personal history of nicotine dependence: Secondary | ICD-10-CM | POA: Diagnosis not present

## 2016-05-12 DIAGNOSIS — Z951 Presence of aortocoronary bypass graft: Secondary | ICD-10-CM | POA: Diagnosis not present

## 2016-05-12 DIAGNOSIS — I5021 Acute systolic (congestive) heart failure: Secondary | ICD-10-CM | POA: Diagnosis not present

## 2016-05-14 DIAGNOSIS — J449 Chronic obstructive pulmonary disease, unspecified: Secondary | ICD-10-CM | POA: Diagnosis not present

## 2016-05-14 DIAGNOSIS — Z951 Presence of aortocoronary bypass graft: Secondary | ICD-10-CM | POA: Diagnosis not present

## 2016-05-14 DIAGNOSIS — I5021 Acute systolic (congestive) heart failure: Secondary | ICD-10-CM | POA: Diagnosis not present

## 2016-05-14 DIAGNOSIS — I25700 Atherosclerosis of coronary artery bypass graft(s), unspecified, with unstable angina pectoris: Secondary | ICD-10-CM | POA: Diagnosis not present

## 2016-05-14 DIAGNOSIS — I110 Hypertensive heart disease with heart failure: Secondary | ICD-10-CM | POA: Diagnosis not present

## 2016-05-14 DIAGNOSIS — Z87891 Personal history of nicotine dependence: Secondary | ICD-10-CM | POA: Diagnosis not present

## 2016-05-17 DIAGNOSIS — E039 Hypothyroidism, unspecified: Secondary | ICD-10-CM | POA: Diagnosis not present

## 2016-05-17 DIAGNOSIS — J449 Chronic obstructive pulmonary disease, unspecified: Secondary | ICD-10-CM | POA: Diagnosis not present

## 2016-05-17 DIAGNOSIS — Z79899 Other long term (current) drug therapy: Secondary | ICD-10-CM | POA: Diagnosis not present

## 2016-05-17 DIAGNOSIS — I4891 Unspecified atrial fibrillation: Secondary | ICD-10-CM | POA: Diagnosis not present

## 2016-05-17 DIAGNOSIS — R0789 Other chest pain: Secondary | ICD-10-CM | POA: Diagnosis not present

## 2016-05-17 DIAGNOSIS — I482 Chronic atrial fibrillation: Secondary | ICD-10-CM | POA: Diagnosis not present

## 2016-05-17 DIAGNOSIS — Z951 Presence of aortocoronary bypass graft: Secondary | ICD-10-CM | POA: Diagnosis not present

## 2016-05-17 DIAGNOSIS — R079 Chest pain, unspecified: Secondary | ICD-10-CM | POA: Diagnosis not present

## 2016-05-17 DIAGNOSIS — I5042 Chronic combined systolic (congestive) and diastolic (congestive) heart failure: Secondary | ICD-10-CM | POA: Diagnosis not present

## 2016-05-17 DIAGNOSIS — D638 Anemia in other chronic diseases classified elsewhere: Secondary | ICD-10-CM | POA: Diagnosis not present

## 2016-05-17 DIAGNOSIS — J441 Chronic obstructive pulmonary disease with (acute) exacerbation: Secondary | ICD-10-CM | POA: Diagnosis not present

## 2016-05-17 DIAGNOSIS — I252 Old myocardial infarction: Secondary | ICD-10-CM | POA: Diagnosis not present

## 2016-05-17 DIAGNOSIS — E440 Moderate protein-calorie malnutrition: Secondary | ICD-10-CM | POA: Diagnosis not present

## 2016-05-17 DIAGNOSIS — I2510 Atherosclerotic heart disease of native coronary artery without angina pectoris: Secondary | ICD-10-CM | POA: Diagnosis not present

## 2016-05-18 DIAGNOSIS — I2510 Atherosclerotic heart disease of native coronary artery without angina pectoris: Secondary | ICD-10-CM | POA: Diagnosis not present

## 2016-05-18 DIAGNOSIS — I482 Chronic atrial fibrillation: Secondary | ICD-10-CM | POA: Diagnosis not present

## 2016-05-18 DIAGNOSIS — D638 Anemia in other chronic diseases classified elsewhere: Secondary | ICD-10-CM | POA: Diagnosis not present

## 2016-05-18 DIAGNOSIS — R0789 Other chest pain: Secondary | ICD-10-CM | POA: Diagnosis not present

## 2016-05-18 DIAGNOSIS — J441 Chronic obstructive pulmonary disease with (acute) exacerbation: Secondary | ICD-10-CM | POA: Diagnosis not present

## 2016-05-19 DIAGNOSIS — I25700 Atherosclerosis of coronary artery bypass graft(s), unspecified, with unstable angina pectoris: Secondary | ICD-10-CM | POA: Diagnosis not present

## 2016-05-19 DIAGNOSIS — I5021 Acute systolic (congestive) heart failure: Secondary | ICD-10-CM | POA: Diagnosis not present

## 2016-05-19 DIAGNOSIS — Z87891 Personal history of nicotine dependence: Secondary | ICD-10-CM | POA: Diagnosis not present

## 2016-05-19 DIAGNOSIS — J449 Chronic obstructive pulmonary disease, unspecified: Secondary | ICD-10-CM | POA: Diagnosis not present

## 2016-05-19 DIAGNOSIS — I110 Hypertensive heart disease with heart failure: Secondary | ICD-10-CM | POA: Diagnosis not present

## 2016-05-19 DIAGNOSIS — Z951 Presence of aortocoronary bypass graft: Secondary | ICD-10-CM | POA: Diagnosis not present

## 2016-05-22 DIAGNOSIS — Z87891 Personal history of nicotine dependence: Secondary | ICD-10-CM | POA: Diagnosis not present

## 2016-05-22 DIAGNOSIS — I110 Hypertensive heart disease with heart failure: Secondary | ICD-10-CM | POA: Diagnosis not present

## 2016-05-22 DIAGNOSIS — I25700 Atherosclerosis of coronary artery bypass graft(s), unspecified, with unstable angina pectoris: Secondary | ICD-10-CM | POA: Diagnosis not present

## 2016-05-22 DIAGNOSIS — I5021 Acute systolic (congestive) heart failure: Secondary | ICD-10-CM | POA: Diagnosis not present

## 2016-05-22 DIAGNOSIS — J449 Chronic obstructive pulmonary disease, unspecified: Secondary | ICD-10-CM | POA: Diagnosis not present

## 2016-05-22 DIAGNOSIS — Z951 Presence of aortocoronary bypass graft: Secondary | ICD-10-CM | POA: Diagnosis not present

## 2016-05-26 DIAGNOSIS — Z951 Presence of aortocoronary bypass graft: Secondary | ICD-10-CM | POA: Diagnosis not present

## 2016-05-26 DIAGNOSIS — I25700 Atherosclerosis of coronary artery bypass graft(s), unspecified, with unstable angina pectoris: Secondary | ICD-10-CM | POA: Diagnosis not present

## 2016-05-26 DIAGNOSIS — I5021 Acute systolic (congestive) heart failure: Secondary | ICD-10-CM | POA: Diagnosis not present

## 2016-05-26 DIAGNOSIS — Z87891 Personal history of nicotine dependence: Secondary | ICD-10-CM | POA: Diagnosis not present

## 2016-05-26 DIAGNOSIS — I110 Hypertensive heart disease with heart failure: Secondary | ICD-10-CM | POA: Diagnosis not present

## 2016-05-26 DIAGNOSIS — J449 Chronic obstructive pulmonary disease, unspecified: Secondary | ICD-10-CM | POA: Diagnosis not present

## 2016-05-28 DIAGNOSIS — I5021 Acute systolic (congestive) heart failure: Secondary | ICD-10-CM | POA: Diagnosis not present

## 2016-05-28 DIAGNOSIS — I110 Hypertensive heart disease with heart failure: Secondary | ICD-10-CM | POA: Diagnosis not present

## 2016-05-28 DIAGNOSIS — J449 Chronic obstructive pulmonary disease, unspecified: Secondary | ICD-10-CM | POA: Diagnosis not present

## 2016-05-28 DIAGNOSIS — Z951 Presence of aortocoronary bypass graft: Secondary | ICD-10-CM | POA: Diagnosis not present

## 2016-05-28 DIAGNOSIS — I25700 Atherosclerosis of coronary artery bypass graft(s), unspecified, with unstable angina pectoris: Secondary | ICD-10-CM | POA: Diagnosis not present

## 2016-05-28 DIAGNOSIS — Z87891 Personal history of nicotine dependence: Secondary | ICD-10-CM | POA: Diagnosis not present

## 2016-06-03 DIAGNOSIS — I110 Hypertensive heart disease with heart failure: Secondary | ICD-10-CM | POA: Diagnosis not present

## 2016-06-03 DIAGNOSIS — J449 Chronic obstructive pulmonary disease, unspecified: Secondary | ICD-10-CM | POA: Diagnosis not present

## 2016-06-03 DIAGNOSIS — Z87891 Personal history of nicotine dependence: Secondary | ICD-10-CM | POA: Diagnosis not present

## 2016-06-03 DIAGNOSIS — I5021 Acute systolic (congestive) heart failure: Secondary | ICD-10-CM | POA: Diagnosis not present

## 2016-06-03 DIAGNOSIS — Z951 Presence of aortocoronary bypass graft: Secondary | ICD-10-CM | POA: Diagnosis not present

## 2016-06-03 DIAGNOSIS — I25700 Atherosclerosis of coronary artery bypass graft(s), unspecified, with unstable angina pectoris: Secondary | ICD-10-CM | POA: Diagnosis not present

## 2016-06-10 DIAGNOSIS — J449 Chronic obstructive pulmonary disease, unspecified: Secondary | ICD-10-CM | POA: Diagnosis not present

## 2016-06-10 DIAGNOSIS — I25700 Atherosclerosis of coronary artery bypass graft(s), unspecified, with unstable angina pectoris: Secondary | ICD-10-CM | POA: Diagnosis not present

## 2016-06-10 DIAGNOSIS — Z951 Presence of aortocoronary bypass graft: Secondary | ICD-10-CM | POA: Diagnosis not present

## 2016-06-10 DIAGNOSIS — I110 Hypertensive heart disease with heart failure: Secondary | ICD-10-CM | POA: Diagnosis not present

## 2016-06-10 DIAGNOSIS — I5021 Acute systolic (congestive) heart failure: Secondary | ICD-10-CM | POA: Diagnosis not present

## 2016-06-10 DIAGNOSIS — Z87891 Personal history of nicotine dependence: Secondary | ICD-10-CM | POA: Diagnosis not present

## 2016-06-18 DIAGNOSIS — I480 Paroxysmal atrial fibrillation: Secondary | ICD-10-CM | POA: Diagnosis not present

## 2016-06-18 DIAGNOSIS — Z87891 Personal history of nicotine dependence: Secondary | ICD-10-CM | POA: Diagnosis not present

## 2016-06-18 DIAGNOSIS — I10 Essential (primary) hypertension: Secondary | ICD-10-CM | POA: Diagnosis not present

## 2016-06-18 DIAGNOSIS — I25118 Atherosclerotic heart disease of native coronary artery with other forms of angina pectoris: Secondary | ICD-10-CM | POA: Diagnosis not present

## 2016-06-18 DIAGNOSIS — Z951 Presence of aortocoronary bypass graft: Secondary | ICD-10-CM | POA: Diagnosis not present

## 2016-06-18 DIAGNOSIS — I25700 Atherosclerosis of coronary artery bypass graft(s), unspecified, with unstable angina pectoris: Secondary | ICD-10-CM | POA: Diagnosis not present

## 2016-06-18 DIAGNOSIS — I110 Hypertensive heart disease with heart failure: Secondary | ICD-10-CM | POA: Diagnosis not present

## 2016-06-18 DIAGNOSIS — I5021 Acute systolic (congestive) heart failure: Secondary | ICD-10-CM | POA: Diagnosis not present

## 2016-06-18 DIAGNOSIS — K219 Gastro-esophageal reflux disease without esophagitis: Secondary | ICD-10-CM | POA: Diagnosis not present

## 2016-06-18 DIAGNOSIS — J449 Chronic obstructive pulmonary disease, unspecified: Secondary | ICD-10-CM | POA: Diagnosis not present

## 2016-07-09 DIAGNOSIS — I10 Essential (primary) hypertension: Secondary | ICD-10-CM | POA: Diagnosis not present

## 2016-09-29 DIAGNOSIS — R5383 Other fatigue: Secondary | ICD-10-CM | POA: Diagnosis not present

## 2016-09-29 DIAGNOSIS — K219 Gastro-esophageal reflux disease without esophagitis: Secondary | ICD-10-CM | POA: Diagnosis not present

## 2016-09-29 DIAGNOSIS — Z79899 Other long term (current) drug therapy: Secondary | ICD-10-CM | POA: Diagnosis not present

## 2016-09-29 DIAGNOSIS — I10 Essential (primary) hypertension: Secondary | ICD-10-CM | POA: Diagnosis not present

## 2016-09-29 DIAGNOSIS — I25118 Atherosclerotic heart disease of native coronary artery with other forms of angina pectoris: Secondary | ICD-10-CM | POA: Diagnosis not present

## 2016-09-29 DIAGNOSIS — I480 Paroxysmal atrial fibrillation: Secondary | ICD-10-CM | POA: Diagnosis not present

## 2016-10-07 ENCOUNTER — Encounter: Payer: Self-pay | Admitting: *Deleted

## 2016-10-08 ENCOUNTER — Ambulatory Visit: Payer: Medicare Other | Admitting: Cardiovascular Disease

## 2016-10-21 ENCOUNTER — Other Ambulatory Visit: Payer: Self-pay | Admitting: *Deleted

## 2016-10-21 MED ORDER — POTASSIUM CHLORIDE CRYS ER 20 MEQ PO TBCR: 20.0000 meq | EXTENDED_RELEASE_TABLET | Freq: Every day | ORAL | 1 refills | Status: DC

## 2016-10-27 ENCOUNTER — Other Ambulatory Visit: Payer: Self-pay | Admitting: *Deleted

## 2016-10-27 MED ORDER — METOPROLOL SUCCINATE ER 25 MG PO TB24: 25.0000 mg | ORAL_TABLET | Freq: Every day | ORAL | 6 refills | Status: DC

## 2016-11-26 ENCOUNTER — Ambulatory Visit (INDEPENDENT_AMBULATORY_CARE_PROVIDER_SITE_OTHER): Payer: Medicare Other | Admitting: Cardiovascular Disease

## 2016-11-26 ENCOUNTER — Encounter: Payer: Self-pay | Admitting: Cardiovascular Disease

## 2016-11-26 VITALS — BP 118/64 | HR 56 | Ht 63.0 in | Wt 102.4 lb

## 2016-11-26 DIAGNOSIS — I519 Heart disease, unspecified: Secondary | ICD-10-CM

## 2016-11-26 DIAGNOSIS — I25768 Atherosclerosis of bypass graft of coronary artery of transplanted heart with other forms of angina pectoris: Secondary | ICD-10-CM

## 2016-11-26 DIAGNOSIS — I5022 Chronic systolic (congestive) heart failure: Secondary | ICD-10-CM

## 2016-11-26 DIAGNOSIS — I4819 Other persistent atrial fibrillation: Secondary | ICD-10-CM

## 2016-11-26 DIAGNOSIS — I481 Persistent atrial fibrillation: Secondary | ICD-10-CM

## 2016-11-26 NOTE — Progress Notes (Signed)
SUBJECTIVE: The patient presents for routine follow-up. She has a history of ischemic heart disease status post CABG in 1989 with a redo CABG in 2000. She has a history of paroxysmal atrial fibrillation and had not been on anticoagulation in the past because of falls risk. She previously developed hypothyroidism while on amiodarone.  Transthoracic ehocardiogram performed at [facility] on 12/04/15 showed severely reduced left ventricular systolic function, EF 99991111, anteroseptal and apical septal akinesis with severe diffuse hypokinesis, mild left atrial enlargement, moderate to severe mitral regurgitation, moderate tricuspid regurgitation, mild aortic regurgitation, and moderately elevated pulmonary pressures of 40-50 mmHg.  Transesophageal echocardiogram on 12/10/15 showed mild left ventricular dilatation, EF 25-30%, mild left atrial dilatation, no thrombus in the left atrial appendage, mild mitral regurgitation, and trivial tricuspid regurgitation.  She is feeling well overall. She has occasional chest pains for which she takes nitroglycerin. She denies exertional dyspnea and leg swelling. She does all her own housework and mops and vacuums.  Review of Systems: As per "subjective", otherwise negative.  No Known Allergies  Current Outpatient Prescriptions  Medication Sig Dispense Refill  . acetaminophen (TYLENOL) 325 MG tablet Take 650 mg by mouth as needed.    . ALPRAZolam (XANAX) 0.5 MG tablet Take 0.5 mg by mouth 2 (two) times daily as needed for anxiety or sleep.    Marland Kitchen amiodarone (PACERONE) 100 MG tablet Take 1 tablet (100 mg total) by mouth daily. 30 tablet 6  . anastrozole (ARIMIDEX) 1 MG tablet Take 1 mg by mouth daily.    Marland Kitchen aspirin EC 81 MG tablet Take 81 mg by mouth daily.    . Biotin 300 MCG TABS Take 1 tablet by mouth daily.    . Cholecalciferol (VITAMIN D3) 1000 units CAPS Take 1 capsule by mouth daily.    . Ferrous Gluconate (IRON 27 PO) Take 1 tablet by mouth daily.     . furosemide (LASIX) 40 MG tablet Take 1 tablet (40 mg total) by mouth daily.    . Hypromellose (ARTIFICIAL TEARS OP) Apply 2 drops to eye as needed.    Marland Kitchen levothyroxine (SYNTHROID, LEVOTHROID) 25 MCG tablet Take 25 mcg by mouth daily before breakfast.    . loratadine (CLARITIN) 10 MG tablet Take 10 mg by mouth daily as needed for allergies.    Marland Kitchen losartan (COZAAR) 25 MG tablet Take 25 mg by mouth daily.    . metoprolol succinate (TOPROL XL) 25 MG 24 hr tablet Take 1 tablet (25 mg total) by mouth daily. 30 tablet 6  . mirtazapine (REMERON) 7.5 MG tablet Take 7.5 mg by mouth at bedtime.    . nitroGLYCERIN (NITROSTAT) 0.4 MG SL tablet DISSOLVE 1 TABLET UNDER TONGUE EVERY 5 MINUTES AS DIRECTED AS NEEDED FOR CHEST PAIN 25 tablet 3  . potassium chloride SA (K-DUR,KLOR-CON) 20 MEQ tablet Take 1 tablet (20 mEq total) by mouth daily. 90 tablet 1  . ranitidine (ZANTAC) 300 MG tablet Take 300 mg by mouth 2 (two) times daily.    . sertraline (ZOLOFT) 25 MG tablet Take 25 mg by mouth daily.    Marland Kitchen tetrahydrozoline-zinc (VISINE-AC) 0.05-0.25 % ophthalmic solution 2 drops as needed.    . vitamin B-12 (CYANOCOBALAMIN) 1000 MCG tablet Take 1,000 mcg by mouth every other day.     No current facility-administered medications for this visit.     Past Medical History:  Diagnosis Date  . Anemia   . Anxiety   . Chronic anxiety   . Coronary artery  disease    remote CABG in 1989 and redo in 2007; has residual stable angina  . Depression   . High risk medication use    on amiodarone  . Hyperlipidemia   . Hypertension   . Hypothyroidism 05/29/2012  . Macular degeneration   . PAF (paroxysmal atrial fibrillation) ([place])   . Stroke ([place])   . Systolic and diastolic CHF, chronic ([facility])    EF 45% per echo in 2012    Past Surgical History:  Procedure Laterality Date  . ABDOMINAL HYSTERECTOMY    . CARDIAC SURGERY    . CORONARY ARTERY BYPASS GRAFT     1989 with redo 2007 1. Redo median sternotomy, extracorporeal  circulation, redo coronary    Social History   Social History  . Marital status: Widowed    Spouse name: N/A  . Number of children: N/A  . Years of education: N/A   Occupational History  . Not on file.   Social History Main Topics  . Smoking status: Former Smoker    Packs/day: 0.25    Years: 2.00    Types: Cigarettes    Start date: 12/12/1945    Quit date: 12/13/1947  . Smokeless tobacco: Never Used  . Alcohol use No  . Drug use: No  . Sexual activity: No   Other Topics Concern  . Not on file   Social History Narrative  . No narrative on file     Vitals:   11/26/16 1416  BP: 118/64  Pulse: (!) 56  SpO2: 96%  Weight: 102 lb 6.4 oz (46.4 kg)  Height: 5\' 3"  (1.6 m)    PHYSICAL EXAM General: NAD HEENT: Normal. Neck: No JVD, no thyromegaly. Lungs: Faint intermittent inspiratory wheezes. No rales. CV: Nondisplaced PMI. Regular rate and rhythm, normal S1/S2, no XX123456, 3/6 pansystolic murmur along left sternal border. No pretibial or periankle edema.  Abdomen: Soft, nontender, no distention.  Neurologic: Alert and oriented.  Psych: Normal affect. Skin: Normal. Musculoskeletal: No gross deformities.    ECG: Most recent ECG reviewed.      ASSESSMENT AND PLAN: 1. CAD with CABG x 2: Stable. Continue ASA, losartan, and Toprol-XL.  2. Persistent atrial fibrillation: No longer on Xarelto. On amiodarone 100 mg daily. TSH and LFT's normal in 03/2016.  3. Chronic systolic heart failure: Euvolemic on Lasix 40 mg daily. Continue Toprol-XL and losartan.  Dispo: f/u 6 months.   Kate Sable, M.D., F.A.C.C.

## 2016-11-26 NOTE — Patient Instructions (Signed)

## 2016-12-15 ENCOUNTER — Other Ambulatory Visit: Payer: Self-pay | Admitting: Cardiovascular Disease

## 2016-12-16 ENCOUNTER — Other Ambulatory Visit: Payer: Self-pay | Admitting: *Deleted

## 2016-12-16 MED ORDER — FUROSEMIDE 40 MG PO TABS: 40.0000 mg | ORAL_TABLET | Freq: Every day | ORAL | 3 refills | Status: DC

## 2017-01-14 DIAGNOSIS — I480 Paroxysmal atrial fibrillation: Secondary | ICD-10-CM | POA: Diagnosis not present

## 2017-01-14 DIAGNOSIS — I10 Essential (primary) hypertension: Secondary | ICD-10-CM | POA: Diagnosis not present

## 2017-01-14 DIAGNOSIS — N3001 Acute cystitis with hematuria: Secondary | ICD-10-CM | POA: Diagnosis not present

## 2017-01-14 DIAGNOSIS — K219 Gastro-esophageal reflux disease without esophagitis: Secondary | ICD-10-CM | POA: Diagnosis not present

## 2017-02-03 ENCOUNTER — Other Ambulatory Visit: Payer: Self-pay | Admitting: Cardiovascular Disease

## 2017-02-03 ENCOUNTER — Other Ambulatory Visit: Payer: Self-pay

## 2017-02-03 MED ORDER — AMIODARONE HCL 200 MG PO TABS
100.0000 mg | ORAL_TABLET | Freq: Every day | ORAL | 6 refills | Status: DC
Start: 2017-02-03 — End: 2017-02-05

## 2017-02-05 ENCOUNTER — Other Ambulatory Visit: Payer: Self-pay | Admitting: *Deleted

## 2017-02-05 MED ORDER — AMIODARONE HCL 200 MG PO TABS: 100.0000 mg | ORAL_TABLET | Freq: Every day | ORAL | 3 refills | Status: DC

## 2017-04-14 ENCOUNTER — Other Ambulatory Visit: Payer: Self-pay | Admitting: Cardiovascular Disease

## 2017-04-15 DIAGNOSIS — K219 Gastro-esophageal reflux disease without esophagitis: Secondary | ICD-10-CM | POA: Diagnosis not present

## 2017-04-15 DIAGNOSIS — Z79899 Other long term (current) drug therapy: Secondary | ICD-10-CM | POA: Diagnosis not present

## 2017-04-15 DIAGNOSIS — I480 Paroxysmal atrial fibrillation: Secondary | ICD-10-CM | POA: Diagnosis not present

## 2017-04-15 DIAGNOSIS — N3001 Acute cystitis with hematuria: Secondary | ICD-10-CM | POA: Diagnosis not present

## 2017-04-15 DIAGNOSIS — I10 Essential (primary) hypertension: Secondary | ICD-10-CM | POA: Diagnosis not present

## 2017-04-15 DIAGNOSIS — E038 Other specified hypothyroidism: Secondary | ICD-10-CM | POA: Diagnosis not present

## 2017-04-29 ENCOUNTER — Other Ambulatory Visit: Payer: Self-pay | Admitting: Cardiovascular Disease

## 2017-05-18 DIAGNOSIS — Z862 Personal history of diseases of the blood and blood-forming organs and certain disorders involving the immune mechanism: Secondary | ICD-10-CM | POA: Diagnosis not present

## 2017-05-18 DIAGNOSIS — C50112 Malignant neoplasm of central portion of left female breast: Secondary | ICD-10-CM | POA: Diagnosis not present

## 2017-05-18 DIAGNOSIS — Z8639 Personal history of other endocrine, nutritional and metabolic disease: Secondary | ICD-10-CM | POA: Diagnosis not present

## 2017-05-18 DIAGNOSIS — Z951 Presence of aortocoronary bypass graft: Secondary | ICD-10-CM | POA: Diagnosis not present

## 2017-05-18 DIAGNOSIS — D696 Thrombocytopenia, unspecified: Secondary | ICD-10-CM | POA: Diagnosis not present

## 2017-05-18 DIAGNOSIS — Z9189 Other specified personal risk factors, not elsewhere classified: Secondary | ICD-10-CM | POA: Diagnosis not present

## 2017-05-18 DIAGNOSIS — E559 Vitamin D deficiency, unspecified: Secondary | ICD-10-CM | POA: Diagnosis not present

## 2017-05-18 DIAGNOSIS — Z170 Estrogen receptor positive status [ER+]: Secondary | ICD-10-CM | POA: Diagnosis not present

## 2017-05-18 DIAGNOSIS — D6489 Other specified anemias: Secondary | ICD-10-CM | POA: Diagnosis not present

## 2017-06-03 ENCOUNTER — Ambulatory Visit: Payer: Medicare Other | Admitting: Cardiovascular Disease

## 2017-06-04 ENCOUNTER — Other Ambulatory Visit: Payer: Self-pay | Admitting: *Deleted

## 2017-06-04 MED ORDER — METOPROLOL SUCCINATE ER 25 MG PO TB24: 25.0000 mg | ORAL_TABLET | Freq: Every day | ORAL | 1 refills | Status: DC

## 2017-07-01 ENCOUNTER — Encounter: Payer: Self-pay | Admitting: *Deleted

## 2017-07-01 ENCOUNTER — Ambulatory Visit (INDEPENDENT_AMBULATORY_CARE_PROVIDER_SITE_OTHER): Payer: Medicare Other | Admitting: Cardiovascular Disease

## 2017-07-01 ENCOUNTER — Encounter: Payer: Self-pay | Admitting: Cardiovascular Disease

## 2017-07-01 ENCOUNTER — Telehealth: Payer: Self-pay | Admitting: Cardiovascular Disease

## 2017-07-01 VITALS — BP 112/54 | HR 64 | Ht 63.0 in | Wt 105.0 lb

## 2017-07-01 DIAGNOSIS — I519 Heart disease, unspecified: Secondary | ICD-10-CM | POA: Diagnosis not present

## 2017-07-01 DIAGNOSIS — I481 Persistent atrial fibrillation: Secondary | ICD-10-CM | POA: Diagnosis not present

## 2017-07-01 DIAGNOSIS — Z79899 Other long term (current) drug therapy: Secondary | ICD-10-CM | POA: Diagnosis not present

## 2017-07-01 DIAGNOSIS — I209 Angina pectoris, unspecified: Secondary | ICD-10-CM

## 2017-07-01 DIAGNOSIS — I5022 Chronic systolic (congestive) heart failure: Secondary | ICD-10-CM

## 2017-07-01 DIAGNOSIS — I25768 Atherosclerosis of bypass graft of coronary artery of transplanted heart with other forms of angina pectoris: Secondary | ICD-10-CM

## 2017-07-01 DIAGNOSIS — I4819 Other persistent atrial fibrillation: Secondary | ICD-10-CM

## 2017-07-01 NOTE — Telephone Encounter (Signed)
Pre-cert Verification for the following procedure   Leane Call set for 07-15-17  At [facility]

## 2017-07-01 NOTE — Progress Notes (Signed)
SUBJECTIVE: The patient presents for routine follow-up. She has a history of ischemic heart disease status post CABG in 1989 with a redo CABG in 2000. She has a history of paroxysmal atrial fibrillation and had not been on anticoagulation in the past because of falls risk. She previously developed hypothyroidism while on amiodarone.  Transthoracic ehocardiogram performed at [facility] on2/1/17showed severely reduced left ventricular systolic function, EF 08-14%,[id number] and apical septal akinesis with severe diffuse hypokinesis, mild left atrial enlargement, moderate to severe mitral regurgitation, moderate tricuspid regurgitation, mild aortic regurgitation, and moderately elevated pulmonary pressures of 40-50 mmHg.  Transesophageal echocardiogram on 2/7/17showed mild left ventricular dilatation, EF 25-30%, mild left atrial dilatation, no thrombus in the left atrial appendage, mild mitral regurgitation, and trivial tricuspid regurgitation.  She is being followed for breast cancer at [facility].  She has episodic anginal episodes but has not taken any nitroglycerin. She has some dementia so obtaining a clear history is difficult. She is here with her daughter. She says "overall I think I'm doing fairly well for my age".  ECG performed in the office today which I personally interpreted demonstrated sinus rhythm with LVH and nonspecific ST segment abnormalities and old anteroseptal infarct.   Review of Systems: As per "subjective", otherwise negative.  No Known Allergies  Current Outpatient Prescriptions  Medication Sig Dispense Refill  . acetaminophen (TYLENOL) 325 MG tablet Take 650 mg by mouth as needed.    . ALPRAZolam (XANAX) 0.5 MG tablet Take 0.5 mg by mouth 2 (two) times daily as needed for anxiety or sleep.    Marland Kitchen amiodarone (PACERONE) 200 MG tablet Take 0.5 tablets (100 mg total) by mouth daily. 45 tablet 3  . anastrozole (ARIMIDEX) 1 MG tablet Take 1 mg by  mouth daily.    Marland Kitchen aspirin EC 81 MG tablet Take 81 mg by mouth daily.    . Biotin 300 MCG TABS Take 1 tablet by mouth daily.    . Cholecalciferol (VITAMIN D3) 1000 units CAPS Take 1 capsule by mouth daily.    . Ferrous Gluconate (IRON 27 PO) Take 1 tablet by mouth daily.    . furosemide (LASIX) 40 MG tablet Take 1 tablet (40 mg total) by mouth daily. 90 tablet 3  . Hypromellose (ARTIFICIAL TEARS OP) Apply 2 drops to eye as needed.    Marland Kitchen KLOR-CON M20 20 MEQ tablet TAKE 1 TABLET (20 MEQ TOTAL) BY MOUTH DAILY. 90 tablet 1  . levothyroxine (SYNTHROID, LEVOTHROID) 25 MCG tablet Take 25 mcg by mouth daily before breakfast.    . loratadine (CLARITIN) 10 MG tablet Take 10 mg by mouth daily as needed for allergies.    Marland Kitchen losartan (COZAAR) 25 MG tablet Take 25 mg by mouth daily.    . metoprolol succinate (TOPROL-XL) 25 MG 24 hr tablet Take 1 tablet (25 mg total) by mouth daily. 90 tablet 1  . mirtazapine (REMERON) 7.5 MG tablet Take 7.5 mg by mouth at bedtime.    . nitroGLYCERIN (NITROSTAT) 0.4 MG SL tablet DISSOLVE 1 TABLET UNDER TONGUE EVERY 5 MINUTES AS DIRECTED AS NEEDED FOR CHEST PAIN 25 tablet 3  . ranitidine (ZANTAC) 300 MG tablet Take 300 mg by mouth 2 (two) times daily.    . sertraline (ZOLOFT) 25 MG tablet Take 25 mg by mouth daily.    Marland Kitchen tetrahydrozoline-zinc (VISINE-AC) 0.05-0.25 % ophthalmic solution 2 drops as needed.    . vitamin B-12 (CYANOCOBALAMIN) 1000 MCG tablet Take 1,000 mcg by mouth every other  day.     No current facility-administered medications for this visit.     Past Medical History:  Diagnosis Date  . Anemia   . Anxiety   . Chronic anxiety   . Coronary artery disease    remote CABG in 1989 and redo in 2007; has residual stable angina  . Depression   . High risk medication use    on amiodarone  . Hyperlipidemia   . Hypertension   . Hypothyroidism 05/29/2012  . Macular degeneration   . PAF (paroxysmal atrial fibrillation) ([place])   . Stroke ([place])   . Systolic and  diastolic CHF, chronic (HCC)    EF 45% per echo in 2012    Past Surgical History:  Procedure Laterality Date  . ABDOMINAL HYSTERECTOMY    . CARDIAC SURGERY    . CORONARY ARTERY BYPASS GRAFT     1989 with redo 2007 1. Redo median sternotomy, extracorporeal circulation, redo coronary    Social History   Social History  . Marital status: Widowed    Spouse name: N/A  . Number of children: N/A  . Years of education: N/A   Occupational History  . Not on file.   Social History Main Topics  . Smoking status: Former Smoker    Packs/day: 0.25    Years: 2.00    Types: Cigarettes    Start date: 12/12/1945    Quit date: 12/13/1947  . Smokeless tobacco: Never Used  . Alcohol use No  . Drug use: No  . Sexual activity: No   Other Topics Concern  . Not on file   Social History Narrative  . No narrative on file     Vitals:   07/01/17 1311  BP: (!) 112/54  Pulse: 64  SpO2: 95%  Weight: 105 lb (47.6 kg)  Height: 5\' 3"  (1.6 m)    Wt Readings from Last 3 Encounters:  07/01/17 105 lb (47.6 kg)  11/26/16 102 lb 6.4 oz (46.4 kg)  03/12/16 92 lb (41.7 kg)     PHYSICAL EXAM General: NAD HEENT: Normal. Neck: No JVD, no thyromegaly. Lungs: Clear to auscultation bilaterally with normal respiratory effort. CV: Nondisplaced PMI.  Regular rate and rhythm, normal S1/S2, no K0/X3, 3/6 pansystolic murmur along left sternal border. No pretibial or periankle edema.    Abdomen: Soft, nontender, no distention.  Neurologic: Alert and oriented.  Psych: Normal affect. Skin: Normal. Musculoskeletal: No gross deformities.    ECG: Most recent ECG reviewed.   Labs: Lab Results  Component Value Date/Time   K 3.5 11/16/2015 04:46 PM   BUN 23 (H) 11/16/2015 04:46 PM   CREATININE 1.20 (H) 11/16/2015 04:46 PM   ALT 14 11/16/2015 04:46 PM   TSH 0.18 (L) 03/26/2015 12:32 PM   HGB 12.2 11/16/2015 04:46 PM     Lipids: Lab Results  Component Value Date/Time   LDLCALC 56 03/26/2015  12:32 PM   CHOL 150 03/26/2015 12:32 PM   TRIG 132.0 03/26/2015 12:32 PM   HDL 67.60 03/26/2015 12:32 PM       ASSESSMENT AND PLAN:  1. CAD with CABG x 2 with episodic angina pectoris: As it has been several years since bypass surgery, I will obtain a [place] for ischemic surveillance. Continue ASA, losartan, and Toprol-XL.  2. Persistent atrial fibrillation: No longer on Xarelto. On amiodarone 100 mg daily. TSH and LFT's normal in 03/2016. I will recheck if not done by PCP in last few months.  3. Chronic systolic heart failure: Euvolemic on  Lasix 40 mg daily. Continue Toprol-XL and losartan.     Disposition: Follow up 6 months.   Kate Sable, M.D., F.A.C.C.

## 2017-07-01 NOTE — Patient Instructions (Signed)
Medication Instructions:  Continue all current medications.  Labwork: none  Testing/Procedures:  Your physician has requested that you have a lexiscan myoview. For further information please visit [email or web address]. Please follow instruction sheet, as given.  Office will contact with results via phone or letter.    Follow-Up: Your physician wants you to follow up in: 6 months.  You will receive a reminder letter in the mail one-two months in advance.  If you don't receive a letter, please call our office to schedule the follow up appointment   Any Other Special Instructions Will Be Listed Below (If Applicable).  If you need a refill on your cardiac medications before your next appointment, please call your pharmacy.  

## 2017-07-02 ENCOUNTER — Encounter: Payer: Self-pay | Admitting: *Deleted

## 2017-07-13 ENCOUNTER — Telehealth: Payer: Self-pay | Admitting: *Deleted

## 2017-07-13 DIAGNOSIS — Z79899 Other long term (current) drug therapy: Secondary | ICD-10-CM

## 2017-07-13 NOTE — Telephone Encounter (Signed)
Joyce Copa (niece) notified.  Will get order ready & she will pick up & do next Thursday at [place] office.

## 2017-07-13 NOTE — Telephone Encounter (Signed)
Received labs from pmd - last done 04-15-17.  Was looking for TSH & liver function.  TSH was done, but no liver function.  Will check with MD to see if he wants another TSH along with the liver function since last done in June.  TSH - 4.500  Labs placed in tray on desk for signature.

## 2017-07-13 NOTE — Telephone Encounter (Signed)
Will call Dr. Sherrie Sport to confirm what lab they use so order will be correct.

## 2017-07-13 NOTE — Telephone Encounter (Signed)
Just LFT's please.

## 2017-07-15 ENCOUNTER — Inpatient Hospital Stay (HOSPITAL_COMMUNITY): Admission: RE | Admit: 2017-07-15 | Payer: Medicare Other | Source: Ambulatory Visit

## 2017-07-15 ENCOUNTER — Encounter (HOSPITAL_COMMUNITY): Payer: Medicare Other

## 2017-07-15 ENCOUNTER — Ambulatory Visit (HOSPITAL_COMMUNITY): Payer: Medicare Other

## 2017-07-15 NOTE — Telephone Encounter (Signed)
Call placed to Dr. Amanda Pea office - [facility] is their lab.

## 2017-07-22 DIAGNOSIS — Z131 Encounter for screening for diabetes mellitus: Secondary | ICD-10-CM | POA: Diagnosis not present

## 2017-07-22 DIAGNOSIS — I25118 Atherosclerotic heart disease of native coronary artery with other forms of angina pectoris: Secondary | ICD-10-CM | POA: Diagnosis not present

## 2017-07-22 DIAGNOSIS — I480 Paroxysmal atrial fibrillation: Secondary | ICD-10-CM | POA: Diagnosis not present

## 2017-07-22 DIAGNOSIS — E038 Other specified hypothyroidism: Secondary | ICD-10-CM | POA: Diagnosis not present

## 2017-07-22 DIAGNOSIS — Z0000 Encounter for general adult medical examination without abnormal findings: Secondary | ICD-10-CM | POA: Diagnosis not present

## 2017-07-22 DIAGNOSIS — I10 Essential (primary) hypertension: Secondary | ICD-10-CM | POA: Diagnosis not present

## 2017-07-22 DIAGNOSIS — K219 Gastro-esophageal reflux disease without esophagitis: Secondary | ICD-10-CM | POA: Diagnosis not present

## 2017-08-02 ENCOUNTER — Encounter: Payer: Self-pay | Admitting: *Deleted

## 2017-10-02 ENCOUNTER — Other Ambulatory Visit: Payer: Self-pay | Admitting: Cardiovascular Disease

## 2017-10-21 DIAGNOSIS — K219 Gastro-esophageal reflux disease without esophagitis: Secondary | ICD-10-CM | POA: Diagnosis not present

## 2017-10-21 DIAGNOSIS — E038 Other specified hypothyroidism: Secondary | ICD-10-CM | POA: Diagnosis not present

## 2017-10-21 DIAGNOSIS — I10 Essential (primary) hypertension: Secondary | ICD-10-CM | POA: Diagnosis not present

## 2017-10-21 DIAGNOSIS — I25118 Atherosclerotic heart disease of native coronary artery with other forms of angina pectoris: Secondary | ICD-10-CM | POA: Diagnosis not present

## 2017-11-04 ENCOUNTER — Other Ambulatory Visit: Payer: Self-pay | Admitting: *Deleted

## 2017-11-04 MED ORDER — AMIODARONE HCL 200 MG PO TABS: 200.0000 mg | ORAL_TABLET | Freq: Two times a day (BID) | ORAL | 3 refills | Status: DC

## 2017-11-05 ENCOUNTER — Other Ambulatory Visit: Payer: Self-pay | Admitting: *Deleted

## 2017-11-05 MED ORDER — FUROSEMIDE 40 MG PO TABS: 40.0000 mg | ORAL_TABLET | Freq: Every day | ORAL | 3 refills | Status: DC

## 2017-11-19 ENCOUNTER — Other Ambulatory Visit: Payer: Self-pay | Admitting: *Deleted

## 2017-11-19 MED ORDER — AMIODARONE HCL 200 MG PO TABS: 100.0000 mg | ORAL_TABLET | Freq: Every day | ORAL | 3 refills | Status: DC

## 2017-12-25 ENCOUNTER — Other Ambulatory Visit: Payer: Self-pay | Admitting: Cardiovascular Disease

## 2017-12-28 ENCOUNTER — Other Ambulatory Visit: Payer: Self-pay | Admitting: *Deleted

## 2017-12-28 MED ORDER — POTASSIUM CHLORIDE CRYS ER 20 MEQ PO TBCR: 20.0000 meq | EXTENDED_RELEASE_TABLET | Freq: Every day | ORAL | 3 refills | Status: DC

## 2018-01-13 ENCOUNTER — Ambulatory Visit: Payer: Medicare Other | Admitting: Cardiovascular Disease

## 2018-01-13 ENCOUNTER — Encounter: Payer: Self-pay | Admitting: Cardiovascular Disease

## 2018-01-13 VITALS — BP 102/72 | HR 70 | Ht 63.0 in | Wt 102.0 lb

## 2018-01-13 DIAGNOSIS — I481 Persistent atrial fibrillation: Secondary | ICD-10-CM

## 2018-01-13 DIAGNOSIS — I5022 Chronic systolic (congestive) heart failure: Secondary | ICD-10-CM

## 2018-01-13 DIAGNOSIS — I25708 Atherosclerosis of coronary artery bypass graft(s), unspecified, with other forms of angina pectoris: Secondary | ICD-10-CM | POA: Diagnosis not present

## 2018-01-13 DIAGNOSIS — Z79899 Other long term (current) drug therapy: Secondary | ICD-10-CM

## 2018-01-13 DIAGNOSIS — I4819 Other persistent atrial fibrillation: Secondary | ICD-10-CM

## 2018-01-13 NOTE — Patient Instructions (Signed)

## 2018-01-13 NOTE — Progress Notes (Signed)
SUBJECTIVE: The patient presents for routine follow-up.  She is here with her daughter who always accompanies her.  She turned 89 this year.  She denies chest pain, palpitations, shortness of breath, and leg swelling.  She has not been hospitalized since her last visit with me.  Liver transaminases were normal when checked in September 2018.  She follows up with her PCP every 3 months and is scheduled to see him next on 01/20/18.      Review of Systems: As per "subjective", otherwise negative.  No Known Allergies  Current Outpatient Medications  Medication Sig Dispense Refill  . acetaminophen (TYLENOL) 325 MG tablet Take 650 mg by mouth as needed.    . ALPRAZolam (XANAX) 0.5 MG tablet Take 0.5 mg by mouth 2 (two) times daily as needed for anxiety or sleep.    Marland Kitchen amiodarone (PACERONE) 200 MG tablet Take 0.5 tablets (100 mg total) by mouth daily. 45 tablet 3  . anastrozole (ARIMIDEX) 1 MG tablet Take 1 mg by mouth daily.    Marland Kitchen aspirin EC 81 MG tablet Take 81 mg by mouth daily.    . Biotin 300 MCG TABS Take 1 tablet by mouth daily.    . Cholecalciferol (VITAMIN D3) 1000 units CAPS Take 1 capsule by mouth daily.    . Ferrous Gluconate (IRON 27 PO) Take 1 tablet by mouth daily.    . furosemide (LASIX) 40 MG tablet Take 1 tablet (40 mg total) by mouth daily. 90 tablet 3  . Hypromellose (ARTIFICIAL TEARS OP) Apply 2 drops to eye as needed.    Marland Kitchen levothyroxine (SYNTHROID, LEVOTHROID) 25 MCG tablet Take 25 mcg by mouth daily before breakfast.    . loratadine (CLARITIN) 10 MG tablet Take 10 mg by mouth daily as needed for allergies.    Marland Kitchen losartan (COZAAR) 25 MG tablet Take 25 mg by mouth daily.    . metoprolol succinate (TOPROL-XL) 25 MG 24 hr tablet TAKE 1 TABLET BY MOUTH EVERY DAY 90 tablet 1  . mirtazapine (REMERON) 7.5 MG tablet Take 7.5 mg by mouth at bedtime.    . nitroGLYCERIN (NITROSTAT) 0.4 MG SL tablet DISSOLVE 1 TABLET UNDER TONGUE EVERY 5 MINUTES AS DIRECTED AS NEEDED FOR  CHEST PAIN 25 tablet 3  . potassium chloride SA (KLOR-CON M20) 20 MEQ tablet Take 1 tablet (20 mEq total) by mouth daily. 90 tablet 3  . ranitidine (ZANTAC) 300 MG tablet Take 300 mg by mouth 2 (two) times daily.    . sertraline (ZOLOFT) 25 MG tablet Take 25 mg by mouth daily.    Marland Kitchen tetrahydrozoline-zinc (VISINE-AC) 0.05-0.25 % ophthalmic solution 2 drops as needed.    . vitamin B-12 (CYANOCOBALAMIN) 1000 MCG tablet Take 1,000 mcg by mouth every other day.     No current facility-administered medications for this visit.     Past Medical History:  Diagnosis Date  . Anemia   . Anxiety   . Chronic anxiety   . Coronary artery disease    remote CABG in 1989 and redo in 2007; has residual stable angina  . Depression   . High risk medication use    on amiodarone  . Hyperlipidemia   . Hypertension   . Hypothyroidism 05/29/2012  . Macular degeneration   . PAF (paroxysmal atrial fibrillation) ([place])   . Stroke ([place])   . Systolic and diastolic CHF, chronic (HCC)    EF 45% per echo in 2012    Past Surgical History:  Procedure Laterality  Date  . ABDOMINAL HYSTERECTOMY    . CARDIAC SURGERY    . CORONARY ARTERY BYPASS GRAFT     1989 with redo 2007 1. Redo median sternotomy, extracorporeal circulation, redo coronary    Social History   Socioeconomic History  . Marital status: Widowed    Spouse name: Not on file  . Number of children: Not on file  . Years of education: Not on file  . Highest education level: Not on file  Social Needs  . Financial resource strain: Not on file  . Food insecurity - worry: Not on file  . Food insecurity - inability: Not on file  . Transportation needs - medical: Not on file  . Transportation needs - non-medical: Not on file  Occupational History  . Not on file  Tobacco Use  . Smoking status: Former Smoker    Packs/day: 0.25    Years: 2.00    Pack years: 0.50    Types: Cigarettes    Start date: 12/12/1945    Last attempt to quit: 12/13/1947     Years since quitting: 70.1  . Smokeless tobacco: Never Used  Substance and Sexual Activity  . Alcohol use: No    Alcohol/week: 0.0 oz  . Drug use: No  . Sexual activity: No  Other Topics Concern  . Not on file  Social History Narrative  . Not on file     Vitals:   01/13/18 1316  BP: 102/72  Pulse: 70  SpO2: 90%  Weight: 102 lb (46.3 kg)  Height: 5\' 3"  (1.6 m)    Wt Readings from Last 3 Encounters:  01/13/18 102 lb (46.3 kg)  07/01/17 105 lb (47.6 kg)  11/26/16 102 lb 6.4 oz (46.4 kg)     PHYSICAL EXAM General: NAD HEENT: Normal. Neck: No JVD, no thyromegaly. Lungs: Clear to auscultation bilaterally with normal respiratory effort. CV: Regular rate and rhythm, normal S1/S2, no R4/B6, 2/6 pansystolic murmur along left sternal border. No pretibial or periankle edema.   Abdomen: Soft, nontender, no distention.  Neurologic: Alert and oriented.  Psych: Normal affect. Skin: Normal. Musculoskeletal: No gross deformities.    ECG: Most recent ECG reviewed.   Labs: Lab Results  Component Value Date/Time   K 3.5 11/16/2015 04:46 PM   BUN 23 (H) 11/16/2015 04:46 PM   CREATININE 1.20 (H) 11/16/2015 04:46 PM   ALT 14 11/16/2015 04:46 PM   TSH 0.18 (L) 03/26/2015 12:32 PM   HGB 12.2 11/16/2015 04:46 PM     Lipids: Lab Results  Component Value Date/Time   LDLCALC 56 03/26/2015 12:32 PM   CHOL 150 03/26/2015 12:32 PM   TRIG 132.0 03/26/2015 12:32 PM   HDL 67.60 03/26/2015 12:32 PM       ASSESSMENT AND PLAN:  1. CAD with CABG x 2 with episodic angina pectoris:  Symptomatically stable. Continue ASA, losartan, and Toprol-XL.  2. Persistent atrial fibrillation: No longer on Xarelto. On amiodarone 100 mg daily.  LFTs normal in September 2018.  3. Chronic systolic heart failure: Euvolemic on Lasix 40 mg daily. Continue Toprol-XL andlosartan.  4. On amiodarone therapy: I will check to see if TSH was checked in December 2018 when she last saw her PCP.  Liver  transaminases were normal in September 2018.   Disposition: Follow up 6 months   Kate Sable, M.D., F.A.C.C.

## 2018-01-20 DIAGNOSIS — K219 Gastro-esophageal reflux disease without esophagitis: Secondary | ICD-10-CM | POA: Diagnosis not present

## 2018-01-20 DIAGNOSIS — E038 Other specified hypothyroidism: Secondary | ICD-10-CM | POA: Diagnosis not present

## 2018-01-20 DIAGNOSIS — I10 Essential (primary) hypertension: Secondary | ICD-10-CM | POA: Diagnosis not present

## 2018-01-20 DIAGNOSIS — I25118 Atherosclerotic heart disease of native coronary artery with other forms of angina pectoris: Secondary | ICD-10-CM | POA: Diagnosis not present

## 2018-01-20 DIAGNOSIS — Z0000 Encounter for general adult medical examination without abnormal findings: Secondary | ICD-10-CM | POA: Diagnosis not present

## 2018-01-20 DIAGNOSIS — Z1389 Encounter for screening for other disorder: Secondary | ICD-10-CM | POA: Diagnosis not present

## 2018-02-03 DIAGNOSIS — M810 Age-related osteoporosis without current pathological fracture: Secondary | ICD-10-CM | POA: Diagnosis not present

## 2018-02-03 DIAGNOSIS — Z780 Asymptomatic menopausal state: Secondary | ICD-10-CM | POA: Diagnosis not present

## 2018-02-07 DIAGNOSIS — M810 Age-related osteoporosis without current pathological fracture: Secondary | ICD-10-CM | POA: Diagnosis not present

## 2018-04-21 DIAGNOSIS — K219 Gastro-esophageal reflux disease without esophagitis: Secondary | ICD-10-CM | POA: Diagnosis not present

## 2018-04-21 DIAGNOSIS — E038 Other specified hypothyroidism: Secondary | ICD-10-CM | POA: Diagnosis not present

## 2018-04-21 DIAGNOSIS — I25118 Atherosclerotic heart disease of native coronary artery with other forms of angina pectoris: Secondary | ICD-10-CM | POA: Diagnosis not present

## 2018-04-21 DIAGNOSIS — I10 Essential (primary) hypertension: Secondary | ICD-10-CM | POA: Diagnosis not present

## 2018-04-21 DIAGNOSIS — Z0000 Encounter for general adult medical examination without abnormal findings: Secondary | ICD-10-CM | POA: Diagnosis not present

## 2018-05-12 DIAGNOSIS — M810 Age-related osteoporosis without current pathological fracture: Secondary | ICD-10-CM | POA: Diagnosis not present

## 2018-05-26 ENCOUNTER — Ambulatory Visit (INDEPENDENT_AMBULATORY_CARE_PROVIDER_SITE_OTHER): Payer: Medicare Other | Admitting: Internal Medicine

## 2018-06-25 ENCOUNTER — Other Ambulatory Visit: Payer: Self-pay | Admitting: Cardiovascular Disease

## 2018-08-04 ENCOUNTER — Ambulatory Visit: Payer: Medicare Other | Admitting: Cardiovascular Disease

## 2018-08-18 DIAGNOSIS — K219 Gastro-esophageal reflux disease without esophagitis: Secondary | ICD-10-CM | POA: Diagnosis not present

## 2018-08-18 DIAGNOSIS — I10 Essential (primary) hypertension: Secondary | ICD-10-CM | POA: Diagnosis not present

## 2018-08-18 DIAGNOSIS — N3020 Other chronic cystitis without hematuria: Secondary | ICD-10-CM | POA: Diagnosis not present

## 2018-08-18 DIAGNOSIS — E038 Other specified hypothyroidism: Secondary | ICD-10-CM | POA: Diagnosis not present

## 2018-08-18 DIAGNOSIS — I25118 Atherosclerotic heart disease of native coronary artery with other forms of angina pectoris: Secondary | ICD-10-CM | POA: Diagnosis not present

## 2018-08-18 DIAGNOSIS — Z0000 Encounter for general adult medical examination without abnormal findings: Secondary | ICD-10-CM | POA: Diagnosis not present

## 2018-08-29 ENCOUNTER — Ambulatory Visit: Payer: Medicare Other | Admitting: Cardiovascular Disease

## 2018-09-22 ENCOUNTER — Encounter

## 2018-09-22 ENCOUNTER — Ambulatory Visit: Payer: Medicare Other | Admitting: Cardiovascular Disease

## 2018-11-17 ENCOUNTER — Other Ambulatory Visit: Payer: Self-pay | Admitting: Cardiovascular Disease

## 2018-11-17 DIAGNOSIS — I25118 Atherosclerotic heart disease of native coronary artery with other forms of angina pectoris: Secondary | ICD-10-CM | POA: Diagnosis not present

## 2018-11-17 DIAGNOSIS — E038 Other specified hypothyroidism: Secondary | ICD-10-CM | POA: Diagnosis not present

## 2018-11-17 DIAGNOSIS — I10 Essential (primary) hypertension: Secondary | ICD-10-CM | POA: Diagnosis not present

## 2018-11-17 DIAGNOSIS — K219 Gastro-esophageal reflux disease without esophagitis: Secondary | ICD-10-CM | POA: Diagnosis not present

## 2018-12-06 ENCOUNTER — Other Ambulatory Visit: Payer: Self-pay | Admitting: Cardiovascular Disease

## 2018-12-15 ENCOUNTER — Ambulatory Visit: Payer: Medicare Other | Admitting: Cardiovascular Disease

## 2018-12-15 ENCOUNTER — Encounter: Payer: Self-pay | Admitting: Cardiovascular Disease

## 2018-12-15 VITALS — BP 169/99 | HR 82 | Ht 63.0 in | Wt 90.4 lb

## 2018-12-15 DIAGNOSIS — I5022 Chronic systolic (congestive) heart failure: Secondary | ICD-10-CM | POA: Diagnosis not present

## 2018-12-15 DIAGNOSIS — I25708 Atherosclerosis of coronary artery bypass graft(s), unspecified, with other forms of angina pectoris: Secondary | ICD-10-CM

## 2018-12-15 DIAGNOSIS — I10 Essential (primary) hypertension: Secondary | ICD-10-CM

## 2018-12-15 DIAGNOSIS — I4819 Other persistent atrial fibrillation: Secondary | ICD-10-CM

## 2018-12-15 DIAGNOSIS — Z79899 Other long term (current) drug therapy: Secondary | ICD-10-CM

## 2018-12-15 NOTE — Patient Instructions (Signed)
Medication Instructions:  Continue all current medications.  Labwork:   Testing/Procedures:   Follow-Up: Your physician wants you to follow up in:  1 year.  You will receive a reminder letter in the mail one-two months in advance.  If you don't receive a letter, please call our office to schedule the follow up appointment   Any Other Special Instructions Will Be Listed Below (If Applicable).  If you need a refill on your cardiac medications before your next appointment, please call your pharmacy.

## 2018-12-15 NOTE — Progress Notes (Signed)
SUBJECTIVE: The patient presents for routine follow-up.  She turned 85 years old 3 days ago.  She is here with her daughter who always accompanies her.  She has a history of CABG in 1989 with redo in 2007.  ECG performed in the office today which I ordered and personally interpreted demonstrates normal sinus rhythm with nonspecific IVCD, LVH and repolarization abnormalities.  The patient denies any symptoms of chest pain, palpitations, shortness of breath, lightheadedness, dizziness, leg swelling, orthopnea, PND, and syncope.  Her only complaint relates to poor vision in her right eye due to macular degeneration.   Review of Systems: As per "subjective", otherwise negative.  No Known Allergies  Current Outpatient Medications  Medication Sig Dispense Refill  . acetaminophen (TYLENOL) 325 MG tablet Take 650 mg by mouth as needed.    . ALPRAZolam (XANAX) 0.5 MG tablet Take 0.5 mg by mouth 2 (two) times daily as needed for anxiety or sleep.    Marland Kitchen amiodarone (PACERONE) 200 MG tablet TAKE 0.5 TABLETS (100 MG TOTAL) BY MOUTH DAILY. 45 tablet 2  . anastrozole (ARIMIDEX) 1 MG tablet Take 1 mg by mouth daily.    Marland Kitchen aspirin EC 81 MG tablet Take 81 mg by mouth daily.    . Biotin 300 MCG TABS Take 1 tablet by mouth daily.    . Cholecalciferol (VITAMIN D3) 1000 units CAPS Take 1 capsule by mouth daily.    . Ferrous Gluconate (IRON 27 PO) Take 1 tablet by mouth daily.    . furosemide (LASIX) 40 MG tablet TAKE 1 TABLET BY MOUTH EVERY DAY 90 tablet 1  . Hypromellose (ARTIFICIAL TEARS OP) Apply 2 drops to eye as needed.    Marland Kitchen KLOR-CON M20 20 MEQ tablet TAKE 1 TABLET BY MOUTH EVERY DAY 90 tablet 2  . levothyroxine (SYNTHROID, LEVOTHROID) 25 MCG tablet Take 25 mcg by mouth daily before breakfast.    . loratadine (CLARITIN) 10 MG tablet Take 10 mg by mouth daily as needed for allergies.    Marland Kitchen losartan (COZAAR) 25 MG tablet Take 25 mg by mouth daily.    . metoprolol succinate (TOPROL-XL) 25 MG 24 hr  tablet TAKE 1 TABLET BY MOUTH EVERY DAY 90 tablet 2  . mirtazapine (REMERON) 7.5 MG tablet Take 7.5 mg by mouth at bedtime.    . nitroGLYCERIN (NITROSTAT) 0.4 MG SL tablet DISSOLVE 1 TABLET UNDER TONGUE EVERY 5 MINUTES AS DIRECTED AS NEEDED FOR CHEST PAIN 25 tablet 3  . ranitidine (ZANTAC) 300 MG tablet Take 300 mg by mouth 2 (two) times daily.    . sertraline (ZOLOFT) 25 MG tablet Take 25 mg by mouth daily.    Marland Kitchen tetrahydrozoline-zinc (VISINE-AC) 0.05-0.25 % ophthalmic solution 2 drops as needed.    . vitamin B-12 (CYANOCOBALAMIN) 1000 MCG tablet Take 1,000 mcg by mouth every other day.     No current facility-administered medications for this visit.     Past Medical History:  Diagnosis Date  . Anemia   . Anxiety   . Chronic anxiety   . Coronary artery disease    remote CABG in 1989 and redo in 2007; has residual stable angina  . Depression   . High risk medication use    on amiodarone  . Hyperlipidemia   . Hypertension   . Hypothyroidism 05/29/2012  . Macular degeneration   . PAF (paroxysmal atrial fibrillation) ([place])   . Stroke ([place])   . Systolic and diastolic CHF, chronic (HCC)    EF  45% per echo in 2012    Past Surgical History:  Procedure Laterality Date  . ABDOMINAL HYSTERECTOMY    . CARDIAC SURGERY    . CORONARY ARTERY BYPASS GRAFT     1989 with redo 2007 1. Redo median sternotomy, extracorporeal circulation, redo coronary    Social History   Socioeconomic History  . Marital status: Widowed    Spouse name: Not on file  . Number of children: Not on file  . Years of education: Not on file  . Highest education level: Not on file  Occupational History  . Not on file  Social Needs  . Financial resource strain: Not on file  . Food insecurity:    Worry: Not on file    Inability: Not on file  . Transportation needs:    Medical: Not on file    Non-medical: Not on file  Tobacco Use  . Smoking status: Former Smoker    Packs/day: 0.25    Years: 2.00    Pack  years: 0.50    Types: Cigarettes    Start date: 12/12/1945    Last attempt to quit: 12/13/1947    Years since quitting: 71.0  . Smokeless tobacco: Never Used  Substance and Sexual Activity  . Alcohol use: No    Alcohol/week: 0.0 standard drinks  . Drug use: No  . Sexual activity: Never  Lifestyle  . Physical activity:    Days per week: Not on file    Minutes per session: Not on file  . Stress: Not on file  Relationships  . Social connections:    Talks on phone: Not on file    Gets together: Not on file    Attends religious service: Not on file    Active member of club or organization: Not on file    Attends meetings of clubs or organizations: Not on file    Relationship status: Not on file  . Intimate partner violence:    Fear of current or ex partner: Not on file    Emotionally abused: Not on file    Physically abused: Not on file    Forced sexual activity: Not on file  Other Topics Concern  . Not on file  Social History Narrative  . Not on file     Vitals:   12/15/18 1313  Weight: 90 lb 6.4 oz (41 kg)  Height: 5\' 3"  (1.6 m)    Wt Readings from Last 3 Encounters:  12/15/18 90 lb 6.4 oz (41 kg)  01/13/18 102 lb (46.3 kg)  07/01/17 105 lb (47.6 kg)     PHYSICAL EXAM General: NAD HEENT: Normal. Neck: No JVD, no thyromegaly. Lungs: Clear to auscultation bilaterally with normal respiratory effort. CV: Regular rate and rhythm, normal S1/S2, no Y1/O1, 2/6 pansystolic murmur along left sternal border. No pretibial or periankle edema.  No carotid bruit.   Abdomen: Soft, nontender, no distention.  Neurologic: Alert and oriented.  Psych: Normal affect. Skin: Normal. Musculoskeletal: No gross deformities.    ECG: Reviewed above under Subjective   Labs: Lab Results  Component Value Date/Time   K 3.5 11/16/2015 04:46 PM   BUN 23 (H) 11/16/2015 04:46 PM   CREATININE 1.20 (H) 11/16/2015 04:46 PM   ALT 14 11/16/2015 04:46 PM   TSH 0.18 (L) 03/26/2015 12:32 PM    HGB 12.2 11/16/2015 04:46 PM     Lipids: Lab Results  Component Value Date/Time   LDLCALC 56 03/26/2015 12:32 PM   CHOL 150 03/26/2015 12:32 PM  TRIG 132.0 03/26/2015 12:32 PM   HDL 67.60 03/26/2015 12:32 PM       ASSESSMENT AND PLAN:  1.  Coronary artery disease: Symptoms are stable.  Continue aspirin, losartan, and Toprol-XL.  Not on statin therapy.  2. Persistent atrial fibrillation: No longer on Xarelto due to falls risk. On amiodarone 100 mg daily.  LFTs normal in September 2018.  I will obtain a copy of most recent TSH and LFTs from PCP.  3. Chronic systolic heart failure: Euvolemic on Lasix 40 mg daily. Continue Toprol-XL andlosartan.  4. On amiodarone therapy:  I will obtain a copy of most recent TSH and LFTs from PCP.  Liver transaminases were normal in September 2018.  5.  Hypertension: Blood pressure was checked immediately after ECG was performed and leads were removed which caused some discomfort.  Her daughter says it is usually normal or low normal.  No changes to therapy.   Disposition: Follow up 1 year   Kate Sable, M.D., F.A.C.C.

## 2018-12-16 ENCOUNTER — Encounter: Payer: Self-pay | Admitting: *Deleted

## 2019-05-17 ENCOUNTER — Other Ambulatory Visit: Payer: Self-pay | Admitting: Cardiovascular Disease

## 2019-07-08 DIAGNOSIS — I2510 Atherosclerotic heart disease of native coronary artery without angina pectoris: Secondary | ICD-10-CM | POA: Diagnosis not present

## 2019-07-08 DIAGNOSIS — Z87891 Personal history of nicotine dependence: Secondary | ICD-10-CM | POA: Diagnosis not present

## 2019-07-08 DIAGNOSIS — U071 COVID-19: Secondary | ICD-10-CM | POA: Diagnosis not present

## 2019-07-08 DIAGNOSIS — I6789 Other cerebrovascular disease: Secondary | ICD-10-CM | POA: Diagnosis not present

## 2019-07-08 DIAGNOSIS — J449 Chronic obstructive pulmonary disease, unspecified: Secondary | ICD-10-CM | POA: Diagnosis not present

## 2019-07-08 DIAGNOSIS — J188 Other pneumonia, unspecified organism: Secondary | ICD-10-CM | POA: Diagnosis not present

## 2019-07-08 DIAGNOSIS — D649 Anemia, unspecified: Secondary | ICD-10-CM | POA: Diagnosis not present

## 2019-07-08 DIAGNOSIS — Z7401 Bed confinement status: Secondary | ICD-10-CM | POA: Diagnosis not present

## 2019-07-08 DIAGNOSIS — H547 Unspecified visual loss: Secondary | ICD-10-CM | POA: Diagnosis not present

## 2019-07-08 DIAGNOSIS — I959 Hypotension, unspecified: Secondary | ICD-10-CM | POA: Diagnosis not present

## 2019-07-08 DIAGNOSIS — J302 Other seasonal allergic rhinitis: Secondary | ICD-10-CM | POA: Diagnosis not present

## 2019-07-08 DIAGNOSIS — Z951 Presence of aortocoronary bypass graft: Secondary | ICD-10-CM | POA: Diagnosis not present

## 2019-07-08 DIAGNOSIS — R404 Transient alteration of awareness: Secondary | ICD-10-CM | POA: Diagnosis not present

## 2019-07-08 DIAGNOSIS — I252 Old myocardial infarction: Secondary | ICD-10-CM | POA: Diagnosis not present

## 2019-07-08 DIAGNOSIS — R1311 Dysphagia, oral phase: Secondary | ICD-10-CM | POA: Diagnosis not present

## 2019-07-08 DIAGNOSIS — Z66 Do not resuscitate: Secondary | ICD-10-CM | POA: Diagnosis not present

## 2019-07-08 DIAGNOSIS — E1165 Type 2 diabetes mellitus with hyperglycemia: Secondary | ICD-10-CM | POA: Diagnosis not present

## 2019-07-08 DIAGNOSIS — J440 Chronic obstructive pulmonary disease with acute lower respiratory infection: Secondary | ICD-10-CM | POA: Diagnosis not present

## 2019-07-08 DIAGNOSIS — I249 Acute ischemic heart disease, unspecified: Secondary | ICD-10-CM | POA: Diagnosis not present

## 2019-07-08 DIAGNOSIS — E440 Moderate protein-calorie malnutrition: Secondary | ICD-10-CM | POA: Diagnosis not present

## 2019-07-08 DIAGNOSIS — R262 Difficulty in walking, not elsewhere classified: Secondary | ICD-10-CM | POA: Diagnosis not present

## 2019-07-08 DIAGNOSIS — R52 Pain, unspecified: Secondary | ICD-10-CM | POA: Diagnosis not present

## 2019-07-08 DIAGNOSIS — E860 Dehydration: Secondary | ICD-10-CM | POA: Diagnosis not present

## 2019-07-08 DIAGNOSIS — M6281 Muscle weakness (generalized): Secondary | ICD-10-CM | POA: Diagnosis not present

## 2019-07-08 DIAGNOSIS — E039 Hypothyroidism, unspecified: Secondary | ICD-10-CM | POA: Diagnosis not present

## 2019-07-08 DIAGNOSIS — R918 Other nonspecific abnormal finding of lung field: Secondary | ICD-10-CM | POA: Diagnosis not present

## 2019-07-08 DIAGNOSIS — G9341 Metabolic encephalopathy: Secondary | ICD-10-CM | POA: Diagnosis not present

## 2019-07-08 DIAGNOSIS — N189 Chronic kidney disease, unspecified: Secondary | ICD-10-CM | POA: Diagnosis not present

## 2019-07-08 DIAGNOSIS — D638 Anemia in other chronic diseases classified elsewhere: Secondary | ICD-10-CM | POA: Diagnosis not present

## 2019-07-08 DIAGNOSIS — I482 Chronic atrial fibrillation, unspecified: Secondary | ICD-10-CM | POA: Diagnosis not present

## 2019-07-08 DIAGNOSIS — I5042 Chronic combined systolic (congestive) and diastolic (congestive) heart failure: Secondary | ICD-10-CM | POA: Diagnosis not present

## 2019-07-08 DIAGNOSIS — H9190 Unspecified hearing loss, unspecified ear: Secondary | ICD-10-CM | POA: Diagnosis not present

## 2019-07-08 DIAGNOSIS — R410 Disorientation, unspecified: Secondary | ICD-10-CM | POA: Diagnosis not present

## 2019-07-08 DIAGNOSIS — J9601 Acute respiratory failure with hypoxia: Secondary | ICD-10-CM | POA: Diagnosis not present

## 2019-07-08 DIAGNOSIS — I69991 Dysphagia following unspecified cerebrovascular disease: Secondary | ICD-10-CM | POA: Diagnosis not present

## 2019-07-08 DIAGNOSIS — J189 Pneumonia, unspecified organism: Secondary | ICD-10-CM | POA: Diagnosis not present

## 2019-07-08 DIAGNOSIS — R0902 Hypoxemia: Secondary | ICD-10-CM | POA: Diagnosis not present

## 2019-07-08 DIAGNOSIS — Z741 Need for assistance with personal care: Secondary | ICD-10-CM | POA: Diagnosis not present

## 2019-07-17 DIAGNOSIS — N189 Chronic kidney disease, unspecified: Secondary | ICD-10-CM | POA: Diagnosis not present

## 2019-07-17 DIAGNOSIS — R410 Disorientation, unspecified: Secondary | ICD-10-CM | POA: Diagnosis not present

## 2019-07-17 DIAGNOSIS — R0902 Hypoxemia: Secondary | ICD-10-CM | POA: Diagnosis not present

## 2019-07-17 DIAGNOSIS — I6789 Other cerebrovascular disease: Secondary | ICD-10-CM | POA: Diagnosis not present

## 2019-07-17 DIAGNOSIS — I2510 Atherosclerotic heart disease of native coronary artery without angina pectoris: Secondary | ICD-10-CM | POA: Diagnosis not present

## 2019-07-17 DIAGNOSIS — H547 Unspecified visual loss: Secondary | ICD-10-CM | POA: Diagnosis not present

## 2019-07-17 DIAGNOSIS — I5042 Chronic combined systolic (congestive) and diastolic (congestive) heart failure: Secondary | ICD-10-CM | POA: Diagnosis not present

## 2019-07-17 DIAGNOSIS — J449 Chronic obstructive pulmonary disease, unspecified: Secondary | ICD-10-CM | POA: Diagnosis not present

## 2019-07-17 DIAGNOSIS — I4891 Unspecified atrial fibrillation: Secondary | ICD-10-CM | POA: Diagnosis not present

## 2019-07-17 DIAGNOSIS — J9601 Acute respiratory failure with hypoxia: Secondary | ICD-10-CM | POA: Diagnosis not present

## 2019-07-17 DIAGNOSIS — I69991 Dysphagia following unspecified cerebrovascular disease: Secondary | ICD-10-CM | POA: Diagnosis not present

## 2019-07-17 DIAGNOSIS — Z7401 Bed confinement status: Secondary | ICD-10-CM | POA: Diagnosis not present

## 2019-07-17 DIAGNOSIS — R262 Difficulty in walking, not elsewhere classified: Secondary | ICD-10-CM | POA: Diagnosis not present

## 2019-07-17 DIAGNOSIS — J189 Pneumonia, unspecified organism: Secondary | ICD-10-CM | POA: Diagnosis not present

## 2019-07-17 DIAGNOSIS — D649 Anemia, unspecified: Secondary | ICD-10-CM | POA: Diagnosis not present

## 2019-07-17 DIAGNOSIS — Z741 Need for assistance with personal care: Secondary | ICD-10-CM | POA: Diagnosis not present

## 2019-07-17 DIAGNOSIS — J302 Other seasonal allergic rhinitis: Secondary | ICD-10-CM | POA: Diagnosis not present

## 2019-07-17 DIAGNOSIS — K922 Gastrointestinal hemorrhage, unspecified: Secondary | ICD-10-CM | POA: Diagnosis not present

## 2019-07-17 DIAGNOSIS — R1311 Dysphagia, oral phase: Secondary | ICD-10-CM | POA: Diagnosis not present

## 2019-07-17 DIAGNOSIS — I482 Chronic atrial fibrillation, unspecified: Secondary | ICD-10-CM | POA: Diagnosis not present

## 2019-07-17 DIAGNOSIS — J188 Other pneumonia, unspecified organism: Secondary | ICD-10-CM | POA: Diagnosis not present

## 2019-07-17 DIAGNOSIS — E46 Unspecified protein-calorie malnutrition: Secondary | ICD-10-CM | POA: Diagnosis not present

## 2019-07-17 DIAGNOSIS — I959 Hypotension, unspecified: Secondary | ICD-10-CM | POA: Diagnosis not present

## 2019-07-17 DIAGNOSIS — R64 Cachexia: Secondary | ICD-10-CM | POA: Diagnosis not present

## 2019-07-17 DIAGNOSIS — H9190 Unspecified hearing loss, unspecified ear: Secondary | ICD-10-CM | POA: Diagnosis not present

## 2019-07-17 DIAGNOSIS — I5040 Unspecified combined systolic (congestive) and diastolic (congestive) heart failure: Secondary | ICD-10-CM | POA: Diagnosis not present

## 2019-07-17 DIAGNOSIS — I249 Acute ischemic heart disease, unspecified: Secondary | ICD-10-CM | POA: Diagnosis not present

## 2019-07-17 DIAGNOSIS — I252 Old myocardial infarction: Secondary | ICD-10-CM | POA: Diagnosis not present

## 2019-07-17 DIAGNOSIS — E039 Hypothyroidism, unspecified: Secondary | ICD-10-CM | POA: Diagnosis not present

## 2019-07-17 DIAGNOSIS — E440 Moderate protein-calorie malnutrition: Secondary | ICD-10-CM | POA: Diagnosis not present

## 2019-07-17 DIAGNOSIS — M6281 Muscle weakness (generalized): Secondary | ICD-10-CM | POA: Diagnosis not present

## 2019-07-18 DIAGNOSIS — K922 Gastrointestinal hemorrhage, unspecified: Secondary | ICD-10-CM | POA: Diagnosis not present

## 2019-07-18 DIAGNOSIS — I5040 Unspecified combined systolic (congestive) and diastolic (congestive) heart failure: Secondary | ICD-10-CM | POA: Diagnosis not present

## 2019-07-18 DIAGNOSIS — D649 Anemia, unspecified: Secondary | ICD-10-CM | POA: Diagnosis not present

## 2019-07-18 DIAGNOSIS — J189 Pneumonia, unspecified organism: Secondary | ICD-10-CM | POA: Diagnosis not present

## 2019-07-20 DIAGNOSIS — R64 Cachexia: Secondary | ICD-10-CM | POA: Diagnosis not present

## 2019-07-20 DIAGNOSIS — I4891 Unspecified atrial fibrillation: Secondary | ICD-10-CM | POA: Diagnosis not present

## 2019-07-20 DIAGNOSIS — E46 Unspecified protein-calorie malnutrition: Secondary | ICD-10-CM | POA: Diagnosis not present

## 2019-07-20 DIAGNOSIS — J189 Pneumonia, unspecified organism: Secondary | ICD-10-CM | POA: Diagnosis not present

## 2019-07-28 DIAGNOSIS — R64 Cachexia: Secondary | ICD-10-CM | POA: Diagnosis not present

## 2019-08-03 DIAGNOSIS — J189 Pneumonia, unspecified organism: Secondary | ICD-10-CM | POA: Diagnosis not present

## 2019-08-03 DIAGNOSIS — R1312 Dysphagia, oropharyngeal phase: Secondary | ICD-10-CM | POA: Diagnosis not present

## 2019-08-03 DIAGNOSIS — I6789 Other cerebrovascular disease: Secondary | ICD-10-CM | POA: Diagnosis not present

## 2019-08-03 DIAGNOSIS — M6281 Muscle weakness (generalized): Secondary | ICD-10-CM | POA: Diagnosis not present

## 2019-08-03 DIAGNOSIS — J188 Other pneumonia, unspecified organism: Secondary | ICD-10-CM | POA: Diagnosis not present

## 2019-08-03 DIAGNOSIS — R1311 Dysphagia, oral phase: Secondary | ICD-10-CM | POA: Diagnosis not present

## 2019-08-03 DIAGNOSIS — R262 Difficulty in walking, not elsewhere classified: Secondary | ICD-10-CM | POA: Diagnosis not present

## 2019-08-03 DIAGNOSIS — Z741 Need for assistance with personal care: Secondary | ICD-10-CM | POA: Diagnosis not present

## 2019-08-04 DIAGNOSIS — Z741 Need for assistance with personal care: Secondary | ICD-10-CM | POA: Diagnosis not present

## 2019-08-04 DIAGNOSIS — R262 Difficulty in walking, not elsewhere classified: Secondary | ICD-10-CM | POA: Diagnosis not present

## 2019-08-04 DIAGNOSIS — J188 Other pneumonia, unspecified organism: Secondary | ICD-10-CM | POA: Diagnosis not present

## 2019-08-04 DIAGNOSIS — R1312 Dysphagia, oropharyngeal phase: Secondary | ICD-10-CM | POA: Diagnosis not present

## 2019-08-04 DIAGNOSIS — M6281 Muscle weakness (generalized): Secondary | ICD-10-CM | POA: Diagnosis not present

## 2019-08-04 DIAGNOSIS — J189 Pneumonia, unspecified organism: Secondary | ICD-10-CM | POA: Diagnosis not present

## 2019-08-04 DIAGNOSIS — I6789 Other cerebrovascular disease: Secondary | ICD-10-CM | POA: Diagnosis not present

## 2019-08-04 DIAGNOSIS — R1311 Dysphagia, oral phase: Secondary | ICD-10-CM | POA: Diagnosis not present

## 2019-08-05 DIAGNOSIS — I6789 Other cerebrovascular disease: Secondary | ICD-10-CM | POA: Diagnosis not present

## 2019-08-05 DIAGNOSIS — Z741 Need for assistance with personal care: Secondary | ICD-10-CM | POA: Diagnosis not present

## 2019-08-05 DIAGNOSIS — J189 Pneumonia, unspecified organism: Secondary | ICD-10-CM | POA: Diagnosis not present

## 2019-08-05 DIAGNOSIS — M6281 Muscle weakness (generalized): Secondary | ICD-10-CM | POA: Diagnosis not present

## 2019-08-05 DIAGNOSIS — R1311 Dysphagia, oral phase: Secondary | ICD-10-CM | POA: Diagnosis not present

## 2019-08-05 DIAGNOSIS — R262 Difficulty in walking, not elsewhere classified: Secondary | ICD-10-CM | POA: Diagnosis not present

## 2019-08-05 DIAGNOSIS — R1312 Dysphagia, oropharyngeal phase: Secondary | ICD-10-CM | POA: Diagnosis not present

## 2019-08-05 DIAGNOSIS — J188 Other pneumonia, unspecified organism: Secondary | ICD-10-CM | POA: Diagnosis not present

## 2019-08-07 DIAGNOSIS — I6789 Other cerebrovascular disease: Secondary | ICD-10-CM | POA: Diagnosis not present

## 2019-08-07 DIAGNOSIS — J188 Other pneumonia, unspecified organism: Secondary | ICD-10-CM | POA: Diagnosis not present

## 2019-08-07 DIAGNOSIS — R262 Difficulty in walking, not elsewhere classified: Secondary | ICD-10-CM | POA: Diagnosis not present

## 2019-08-07 DIAGNOSIS — R1311 Dysphagia, oral phase: Secondary | ICD-10-CM | POA: Diagnosis not present

## 2019-08-07 DIAGNOSIS — J189 Pneumonia, unspecified organism: Secondary | ICD-10-CM | POA: Diagnosis not present

## 2019-08-07 DIAGNOSIS — M6281 Muscle weakness (generalized): Secondary | ICD-10-CM | POA: Diagnosis not present

## 2019-08-07 DIAGNOSIS — R1312 Dysphagia, oropharyngeal phase: Secondary | ICD-10-CM | POA: Diagnosis not present

## 2019-08-07 DIAGNOSIS — Z741 Need for assistance with personal care: Secondary | ICD-10-CM | POA: Diagnosis not present

## 2019-08-08 DIAGNOSIS — R262 Difficulty in walking, not elsewhere classified: Secondary | ICD-10-CM | POA: Diagnosis not present

## 2019-08-08 DIAGNOSIS — I6789 Other cerebrovascular disease: Secondary | ICD-10-CM | POA: Diagnosis not present

## 2019-08-08 DIAGNOSIS — J188 Other pneumonia, unspecified organism: Secondary | ICD-10-CM | POA: Diagnosis not present

## 2019-08-08 DIAGNOSIS — J189 Pneumonia, unspecified organism: Secondary | ICD-10-CM | POA: Diagnosis not present

## 2019-08-08 DIAGNOSIS — M6281 Muscle weakness (generalized): Secondary | ICD-10-CM | POA: Diagnosis not present

## 2019-08-08 DIAGNOSIS — R1312 Dysphagia, oropharyngeal phase: Secondary | ICD-10-CM | POA: Diagnosis not present

## 2019-08-08 DIAGNOSIS — R1311 Dysphagia, oral phase: Secondary | ICD-10-CM | POA: Diagnosis not present

## 2019-08-08 DIAGNOSIS — Z741 Need for assistance with personal care: Secondary | ICD-10-CM | POA: Diagnosis not present

## 2019-08-09 DIAGNOSIS — I6789 Other cerebrovascular disease: Secondary | ICD-10-CM | POA: Diagnosis not present

## 2019-08-09 DIAGNOSIS — J188 Other pneumonia, unspecified organism: Secondary | ICD-10-CM | POA: Diagnosis not present

## 2019-08-09 DIAGNOSIS — R1312 Dysphagia, oropharyngeal phase: Secondary | ICD-10-CM | POA: Diagnosis not present

## 2019-08-09 DIAGNOSIS — M6281 Muscle weakness (generalized): Secondary | ICD-10-CM | POA: Diagnosis not present

## 2019-08-09 DIAGNOSIS — R1311 Dysphagia, oral phase: Secondary | ICD-10-CM | POA: Diagnosis not present

## 2019-08-09 DIAGNOSIS — Z741 Need for assistance with personal care: Secondary | ICD-10-CM | POA: Diagnosis not present

## 2019-08-09 DIAGNOSIS — J189 Pneumonia, unspecified organism: Secondary | ICD-10-CM | POA: Diagnosis not present

## 2019-08-09 DIAGNOSIS — R262 Difficulty in walking, not elsewhere classified: Secondary | ICD-10-CM | POA: Diagnosis not present

## 2019-08-10 DIAGNOSIS — R262 Difficulty in walking, not elsewhere classified: Secondary | ICD-10-CM | POA: Diagnosis not present

## 2019-08-10 DIAGNOSIS — J188 Other pneumonia, unspecified organism: Secondary | ICD-10-CM | POA: Diagnosis not present

## 2019-08-10 DIAGNOSIS — J189 Pneumonia, unspecified organism: Secondary | ICD-10-CM | POA: Diagnosis not present

## 2019-08-10 DIAGNOSIS — M6281 Muscle weakness (generalized): Secondary | ICD-10-CM | POA: Diagnosis not present

## 2019-08-10 DIAGNOSIS — I6789 Other cerebrovascular disease: Secondary | ICD-10-CM | POA: Diagnosis not present

## 2019-08-10 DIAGNOSIS — R1312 Dysphagia, oropharyngeal phase: Secondary | ICD-10-CM | POA: Diagnosis not present

## 2019-08-10 DIAGNOSIS — R1311 Dysphagia, oral phase: Secondary | ICD-10-CM | POA: Diagnosis not present

## 2019-08-10 DIAGNOSIS — Z741 Need for assistance with personal care: Secondary | ICD-10-CM | POA: Diagnosis not present

## 2019-08-11 DIAGNOSIS — R262 Difficulty in walking, not elsewhere classified: Secondary | ICD-10-CM | POA: Diagnosis not present

## 2019-08-11 DIAGNOSIS — J189 Pneumonia, unspecified organism: Secondary | ICD-10-CM | POA: Diagnosis not present

## 2019-08-11 DIAGNOSIS — Z741 Need for assistance with personal care: Secondary | ICD-10-CM | POA: Diagnosis not present

## 2019-08-11 DIAGNOSIS — R1312 Dysphagia, oropharyngeal phase: Secondary | ICD-10-CM | POA: Diagnosis not present

## 2019-08-11 DIAGNOSIS — J188 Other pneumonia, unspecified organism: Secondary | ICD-10-CM | POA: Diagnosis not present

## 2019-08-11 DIAGNOSIS — R1311 Dysphagia, oral phase: Secondary | ICD-10-CM | POA: Diagnosis not present

## 2019-08-11 DIAGNOSIS — M6281 Muscle weakness (generalized): Secondary | ICD-10-CM | POA: Diagnosis not present

## 2019-08-11 DIAGNOSIS — I6789 Other cerebrovascular disease: Secondary | ICD-10-CM | POA: Diagnosis not present

## 2019-08-12 DIAGNOSIS — M6281 Muscle weakness (generalized): Secondary | ICD-10-CM | POA: Diagnosis not present

## 2019-08-12 DIAGNOSIS — J189 Pneumonia, unspecified organism: Secondary | ICD-10-CM | POA: Diagnosis not present

## 2019-08-12 DIAGNOSIS — I6789 Other cerebrovascular disease: Secondary | ICD-10-CM | POA: Diagnosis not present

## 2019-08-12 DIAGNOSIS — Z741 Need for assistance with personal care: Secondary | ICD-10-CM | POA: Diagnosis not present

## 2019-08-12 DIAGNOSIS — R1312 Dysphagia, oropharyngeal phase: Secondary | ICD-10-CM | POA: Diagnosis not present

## 2019-08-12 DIAGNOSIS — R1311 Dysphagia, oral phase: Secondary | ICD-10-CM | POA: Diagnosis not present

## 2019-08-12 DIAGNOSIS — J188 Other pneumonia, unspecified organism: Secondary | ICD-10-CM | POA: Diagnosis not present

## 2019-08-12 DIAGNOSIS — R262 Difficulty in walking, not elsewhere classified: Secondary | ICD-10-CM | POA: Diagnosis not present

## 2019-08-13 DIAGNOSIS — I6789 Other cerebrovascular disease: Secondary | ICD-10-CM | POA: Diagnosis not present

## 2019-08-13 DIAGNOSIS — M6281 Muscle weakness (generalized): Secondary | ICD-10-CM | POA: Diagnosis not present

## 2019-08-13 DIAGNOSIS — R262 Difficulty in walking, not elsewhere classified: Secondary | ICD-10-CM | POA: Diagnosis not present

## 2019-08-13 DIAGNOSIS — Z741 Need for assistance with personal care: Secondary | ICD-10-CM | POA: Diagnosis not present

## 2019-08-13 DIAGNOSIS — R1312 Dysphagia, oropharyngeal phase: Secondary | ICD-10-CM | POA: Diagnosis not present

## 2019-08-13 DIAGNOSIS — J189 Pneumonia, unspecified organism: Secondary | ICD-10-CM | POA: Diagnosis not present

## 2019-08-13 DIAGNOSIS — R1311 Dysphagia, oral phase: Secondary | ICD-10-CM | POA: Diagnosis not present

## 2019-08-13 DIAGNOSIS — J188 Other pneumonia, unspecified organism: Secondary | ICD-10-CM | POA: Diagnosis not present

## 2019-08-14 DIAGNOSIS — I6789 Other cerebrovascular disease: Secondary | ICD-10-CM | POA: Diagnosis not present

## 2019-08-14 DIAGNOSIS — R262 Difficulty in walking, not elsewhere classified: Secondary | ICD-10-CM | POA: Diagnosis not present

## 2019-08-14 DIAGNOSIS — R1312 Dysphagia, oropharyngeal phase: Secondary | ICD-10-CM | POA: Diagnosis not present

## 2019-08-14 DIAGNOSIS — R1311 Dysphagia, oral phase: Secondary | ICD-10-CM | POA: Diagnosis not present

## 2019-08-14 DIAGNOSIS — Z741 Need for assistance with personal care: Secondary | ICD-10-CM | POA: Diagnosis not present

## 2019-08-14 DIAGNOSIS — M6281 Muscle weakness (generalized): Secondary | ICD-10-CM | POA: Diagnosis not present

## 2019-08-14 DIAGNOSIS — J188 Other pneumonia, unspecified organism: Secondary | ICD-10-CM | POA: Diagnosis not present

## 2019-08-14 DIAGNOSIS — J189 Pneumonia, unspecified organism: Secondary | ICD-10-CM | POA: Diagnosis not present

## 2019-08-15 DIAGNOSIS — I6789 Other cerebrovascular disease: Secondary | ICD-10-CM | POA: Diagnosis not present

## 2019-08-15 DIAGNOSIS — J189 Pneumonia, unspecified organism: Secondary | ICD-10-CM | POA: Diagnosis not present

## 2019-08-15 DIAGNOSIS — R1312 Dysphagia, oropharyngeal phase: Secondary | ICD-10-CM | POA: Diagnosis not present

## 2019-08-15 DIAGNOSIS — J188 Other pneumonia, unspecified organism: Secondary | ICD-10-CM | POA: Diagnosis not present

## 2019-08-15 DIAGNOSIS — R262 Difficulty in walking, not elsewhere classified: Secondary | ICD-10-CM | POA: Diagnosis not present

## 2019-08-15 DIAGNOSIS — R1311 Dysphagia, oral phase: Secondary | ICD-10-CM | POA: Diagnosis not present

## 2019-08-15 DIAGNOSIS — Z741 Need for assistance with personal care: Secondary | ICD-10-CM | POA: Diagnosis not present

## 2019-08-15 DIAGNOSIS — M6281 Muscle weakness (generalized): Secondary | ICD-10-CM | POA: Diagnosis not present

## 2019-08-16 DIAGNOSIS — R262 Difficulty in walking, not elsewhere classified: Secondary | ICD-10-CM | POA: Diagnosis not present

## 2019-08-16 DIAGNOSIS — J189 Pneumonia, unspecified organism: Secondary | ICD-10-CM | POA: Diagnosis not present

## 2019-08-16 DIAGNOSIS — Z741 Need for assistance with personal care: Secondary | ICD-10-CM | POA: Diagnosis not present

## 2019-08-16 DIAGNOSIS — R1312 Dysphagia, oropharyngeal phase: Secondary | ICD-10-CM | POA: Diagnosis not present

## 2019-08-16 DIAGNOSIS — M6281 Muscle weakness (generalized): Secondary | ICD-10-CM | POA: Diagnosis not present

## 2019-08-16 DIAGNOSIS — J188 Other pneumonia, unspecified organism: Secondary | ICD-10-CM | POA: Diagnosis not present

## 2019-08-16 DIAGNOSIS — I6789 Other cerebrovascular disease: Secondary | ICD-10-CM | POA: Diagnosis not present

## 2019-08-16 DIAGNOSIS — R1311 Dysphagia, oral phase: Secondary | ICD-10-CM | POA: Diagnosis not present

## 2019-08-17 DIAGNOSIS — R1311 Dysphagia, oral phase: Secondary | ICD-10-CM | POA: Diagnosis not present

## 2019-08-17 DIAGNOSIS — M6281 Muscle weakness (generalized): Secondary | ICD-10-CM | POA: Diagnosis not present

## 2019-08-17 DIAGNOSIS — Z741 Need for assistance with personal care: Secondary | ICD-10-CM | POA: Diagnosis not present

## 2019-08-17 DIAGNOSIS — R262 Difficulty in walking, not elsewhere classified: Secondary | ICD-10-CM | POA: Diagnosis not present

## 2019-08-17 DIAGNOSIS — I6789 Other cerebrovascular disease: Secondary | ICD-10-CM | POA: Diagnosis not present

## 2019-08-17 DIAGNOSIS — J188 Other pneumonia, unspecified organism: Secondary | ICD-10-CM | POA: Diagnosis not present

## 2019-08-17 DIAGNOSIS — J189 Pneumonia, unspecified organism: Secondary | ICD-10-CM | POA: Diagnosis not present

## 2019-08-17 DIAGNOSIS — R1312 Dysphagia, oropharyngeal phase: Secondary | ICD-10-CM | POA: Diagnosis not present

## 2019-08-18 DIAGNOSIS — J188 Other pneumonia, unspecified organism: Secondary | ICD-10-CM | POA: Diagnosis not present

## 2019-08-18 DIAGNOSIS — R1311 Dysphagia, oral phase: Secondary | ICD-10-CM | POA: Diagnosis not present

## 2019-08-18 DIAGNOSIS — I6789 Other cerebrovascular disease: Secondary | ICD-10-CM | POA: Diagnosis not present

## 2019-08-18 DIAGNOSIS — J189 Pneumonia, unspecified organism: Secondary | ICD-10-CM | POA: Diagnosis not present

## 2019-08-18 DIAGNOSIS — M6281 Muscle weakness (generalized): Secondary | ICD-10-CM | POA: Diagnosis not present

## 2019-08-18 DIAGNOSIS — Z741 Need for assistance with personal care: Secondary | ICD-10-CM | POA: Diagnosis not present

## 2019-08-18 DIAGNOSIS — R262 Difficulty in walking, not elsewhere classified: Secondary | ICD-10-CM | POA: Diagnosis not present

## 2019-08-20 DIAGNOSIS — M6281 Muscle weakness (generalized): Secondary | ICD-10-CM | POA: Diagnosis not present

## 2019-08-20 DIAGNOSIS — Z741 Need for assistance with personal care: Secondary | ICD-10-CM | POA: Diagnosis not present

## 2019-08-20 DIAGNOSIS — R1311 Dysphagia, oral phase: Secondary | ICD-10-CM | POA: Diagnosis not present

## 2019-08-20 DIAGNOSIS — J189 Pneumonia, unspecified organism: Secondary | ICD-10-CM | POA: Diagnosis not present

## 2019-08-20 DIAGNOSIS — J188 Other pneumonia, unspecified organism: Secondary | ICD-10-CM | POA: Diagnosis not present

## 2019-08-20 DIAGNOSIS — H109 Unspecified conjunctivitis: Secondary | ICD-10-CM | POA: Diagnosis not present

## 2019-08-20 DIAGNOSIS — R262 Difficulty in walking, not elsewhere classified: Secondary | ICD-10-CM | POA: Diagnosis not present

## 2019-08-20 DIAGNOSIS — I6789 Other cerebrovascular disease: Secondary | ICD-10-CM | POA: Diagnosis not present

## 2019-08-21 DIAGNOSIS — I6789 Other cerebrovascular disease: Secondary | ICD-10-CM | POA: Diagnosis not present

## 2019-08-21 DIAGNOSIS — M6281 Muscle weakness (generalized): Secondary | ICD-10-CM | POA: Diagnosis not present

## 2019-08-21 DIAGNOSIS — J188 Other pneumonia, unspecified organism: Secondary | ICD-10-CM | POA: Diagnosis not present

## 2019-08-21 DIAGNOSIS — R262 Difficulty in walking, not elsewhere classified: Secondary | ICD-10-CM | POA: Diagnosis not present

## 2019-08-21 DIAGNOSIS — R1311 Dysphagia, oral phase: Secondary | ICD-10-CM | POA: Diagnosis not present

## 2019-08-21 DIAGNOSIS — Z741 Need for assistance with personal care: Secondary | ICD-10-CM | POA: Diagnosis not present

## 2019-08-21 DIAGNOSIS — J189 Pneumonia, unspecified organism: Secondary | ICD-10-CM | POA: Diagnosis not present

## 2019-08-22 DIAGNOSIS — Z741 Need for assistance with personal care: Secondary | ICD-10-CM | POA: Diagnosis not present

## 2019-08-22 DIAGNOSIS — M6281 Muscle weakness (generalized): Secondary | ICD-10-CM | POA: Diagnosis not present

## 2019-08-22 DIAGNOSIS — J189 Pneumonia, unspecified organism: Secondary | ICD-10-CM | POA: Diagnosis not present

## 2019-08-22 DIAGNOSIS — J188 Other pneumonia, unspecified organism: Secondary | ICD-10-CM | POA: Diagnosis not present

## 2019-08-22 DIAGNOSIS — R1311 Dysphagia, oral phase: Secondary | ICD-10-CM | POA: Diagnosis not present

## 2019-08-22 DIAGNOSIS — I6789 Other cerebrovascular disease: Secondary | ICD-10-CM | POA: Diagnosis not present

## 2019-08-22 DIAGNOSIS — R262 Difficulty in walking, not elsewhere classified: Secondary | ICD-10-CM | POA: Diagnosis not present

## 2019-08-23 DIAGNOSIS — J189 Pneumonia, unspecified organism: Secondary | ICD-10-CM | POA: Diagnosis not present

## 2019-08-23 DIAGNOSIS — R1311 Dysphagia, oral phase: Secondary | ICD-10-CM | POA: Diagnosis not present

## 2019-08-23 DIAGNOSIS — Z741 Need for assistance with personal care: Secondary | ICD-10-CM | POA: Diagnosis not present

## 2019-08-23 DIAGNOSIS — R262 Difficulty in walking, not elsewhere classified: Secondary | ICD-10-CM | POA: Diagnosis not present

## 2019-08-23 DIAGNOSIS — J188 Other pneumonia, unspecified organism: Secondary | ICD-10-CM | POA: Diagnosis not present

## 2019-08-23 DIAGNOSIS — I6789 Other cerebrovascular disease: Secondary | ICD-10-CM | POA: Diagnosis not present

## 2019-08-23 DIAGNOSIS — M6281 Muscle weakness (generalized): Secondary | ICD-10-CM | POA: Diagnosis not present

## 2019-08-24 DIAGNOSIS — J189 Pneumonia, unspecified organism: Secondary | ICD-10-CM | POA: Diagnosis not present

## 2019-08-24 DIAGNOSIS — I6789 Other cerebrovascular disease: Secondary | ICD-10-CM | POA: Diagnosis not present

## 2019-08-24 DIAGNOSIS — R262 Difficulty in walking, not elsewhere classified: Secondary | ICD-10-CM | POA: Diagnosis not present

## 2019-08-24 DIAGNOSIS — R1311 Dysphagia, oral phase: Secondary | ICD-10-CM | POA: Diagnosis not present

## 2019-08-24 DIAGNOSIS — J188 Other pneumonia, unspecified organism: Secondary | ICD-10-CM | POA: Diagnosis not present

## 2019-08-24 DIAGNOSIS — Z741 Need for assistance with personal care: Secondary | ICD-10-CM | POA: Diagnosis not present

## 2019-08-24 DIAGNOSIS — M6281 Muscle weakness (generalized): Secondary | ICD-10-CM | POA: Diagnosis not present

## 2019-08-25 DIAGNOSIS — E46 Unspecified protein-calorie malnutrition: Secondary | ICD-10-CM | POA: Diagnosis not present

## 2019-08-25 DIAGNOSIS — R262 Difficulty in walking, not elsewhere classified: Secondary | ICD-10-CM | POA: Diagnosis not present

## 2019-08-25 DIAGNOSIS — J189 Pneumonia, unspecified organism: Secondary | ICD-10-CM | POA: Diagnosis not present

## 2019-08-25 DIAGNOSIS — M6281 Muscle weakness (generalized): Secondary | ICD-10-CM | POA: Diagnosis not present

## 2019-08-25 DIAGNOSIS — Z741 Need for assistance with personal care: Secondary | ICD-10-CM | POA: Diagnosis not present

## 2019-08-25 DIAGNOSIS — R1311 Dysphagia, oral phase: Secondary | ICD-10-CM | POA: Diagnosis not present

## 2019-08-25 DIAGNOSIS — I6789 Other cerebrovascular disease: Secondary | ICD-10-CM | POA: Diagnosis not present

## 2019-08-25 DIAGNOSIS — D649 Anemia, unspecified: Secondary | ICD-10-CM | POA: Diagnosis not present

## 2019-08-25 DIAGNOSIS — R64 Cachexia: Secondary | ICD-10-CM | POA: Diagnosis not present

## 2019-08-25 DIAGNOSIS — I5040 Unspecified combined systolic (congestive) and diastolic (congestive) heart failure: Secondary | ICD-10-CM | POA: Diagnosis not present

## 2019-08-25 DIAGNOSIS — J188 Other pneumonia, unspecified organism: Secondary | ICD-10-CM | POA: Diagnosis not present

## 2019-08-26 DIAGNOSIS — E039 Hypothyroidism, unspecified: Secondary | ICD-10-CM | POA: Diagnosis not present

## 2019-08-26 DIAGNOSIS — D649 Anemia, unspecified: Secondary | ICD-10-CM | POA: Diagnosis not present

## 2019-08-26 DIAGNOSIS — E559 Vitamin D deficiency, unspecified: Secondary | ICD-10-CM | POA: Diagnosis not present

## 2019-08-28 DIAGNOSIS — I6789 Other cerebrovascular disease: Secondary | ICD-10-CM | POA: Diagnosis not present

## 2019-08-28 DIAGNOSIS — Z741 Need for assistance with personal care: Secondary | ICD-10-CM | POA: Diagnosis not present

## 2019-08-28 DIAGNOSIS — R262 Difficulty in walking, not elsewhere classified: Secondary | ICD-10-CM | POA: Diagnosis not present

## 2019-08-28 DIAGNOSIS — R1311 Dysphagia, oral phase: Secondary | ICD-10-CM | POA: Diagnosis not present

## 2019-08-28 DIAGNOSIS — J188 Other pneumonia, unspecified organism: Secondary | ICD-10-CM | POA: Diagnosis not present

## 2019-08-28 DIAGNOSIS — M6281 Muscle weakness (generalized): Secondary | ICD-10-CM | POA: Diagnosis not present

## 2019-08-28 DIAGNOSIS — J189 Pneumonia, unspecified organism: Secondary | ICD-10-CM | POA: Diagnosis not present

## 2019-08-29 DIAGNOSIS — J189 Pneumonia, unspecified organism: Secondary | ICD-10-CM | POA: Diagnosis not present

## 2019-08-29 DIAGNOSIS — M6281 Muscle weakness (generalized): Secondary | ICD-10-CM | POA: Diagnosis not present

## 2019-08-29 DIAGNOSIS — R1311 Dysphagia, oral phase: Secondary | ICD-10-CM | POA: Diagnosis not present

## 2019-08-29 DIAGNOSIS — I6789 Other cerebrovascular disease: Secondary | ICD-10-CM | POA: Diagnosis not present

## 2019-08-29 DIAGNOSIS — Z741 Need for assistance with personal care: Secondary | ICD-10-CM | POA: Diagnosis not present

## 2019-08-29 DIAGNOSIS — R262 Difficulty in walking, not elsewhere classified: Secondary | ICD-10-CM | POA: Diagnosis not present

## 2019-08-29 DIAGNOSIS — J188 Other pneumonia, unspecified organism: Secondary | ICD-10-CM | POA: Diagnosis not present

## 2019-08-30 DIAGNOSIS — R262 Difficulty in walking, not elsewhere classified: Secondary | ICD-10-CM | POA: Diagnosis not present

## 2019-08-30 DIAGNOSIS — J189 Pneumonia, unspecified organism: Secondary | ICD-10-CM | POA: Diagnosis not present

## 2019-08-30 DIAGNOSIS — I6789 Other cerebrovascular disease: Secondary | ICD-10-CM | POA: Diagnosis not present

## 2019-08-30 DIAGNOSIS — J188 Other pneumonia, unspecified organism: Secondary | ICD-10-CM | POA: Diagnosis not present

## 2019-08-30 DIAGNOSIS — M6281 Muscle weakness (generalized): Secondary | ICD-10-CM | POA: Diagnosis not present

## 2019-08-30 DIAGNOSIS — Z741 Need for assistance with personal care: Secondary | ICD-10-CM | POA: Diagnosis not present

## 2019-08-30 DIAGNOSIS — R1311 Dysphagia, oral phase: Secondary | ICD-10-CM | POA: Diagnosis not present

## 2019-08-31 DIAGNOSIS — R1311 Dysphagia, oral phase: Secondary | ICD-10-CM | POA: Diagnosis not present

## 2019-08-31 DIAGNOSIS — J188 Other pneumonia, unspecified organism: Secondary | ICD-10-CM | POA: Diagnosis not present

## 2019-08-31 DIAGNOSIS — M6281 Muscle weakness (generalized): Secondary | ICD-10-CM | POA: Diagnosis not present

## 2019-08-31 DIAGNOSIS — J189 Pneumonia, unspecified organism: Secondary | ICD-10-CM | POA: Diagnosis not present

## 2019-08-31 DIAGNOSIS — Z741 Need for assistance with personal care: Secondary | ICD-10-CM | POA: Diagnosis not present

## 2019-08-31 DIAGNOSIS — R262 Difficulty in walking, not elsewhere classified: Secondary | ICD-10-CM | POA: Diagnosis not present

## 2019-08-31 DIAGNOSIS — I6789 Other cerebrovascular disease: Secondary | ICD-10-CM | POA: Diagnosis not present

## 2019-09-01 DIAGNOSIS — Z741 Need for assistance with personal care: Secondary | ICD-10-CM | POA: Diagnosis not present

## 2019-09-01 DIAGNOSIS — R1311 Dysphagia, oral phase: Secondary | ICD-10-CM | POA: Diagnosis not present

## 2019-09-01 DIAGNOSIS — J188 Other pneumonia, unspecified organism: Secondary | ICD-10-CM | POA: Diagnosis not present

## 2019-09-01 DIAGNOSIS — R262 Difficulty in walking, not elsewhere classified: Secondary | ICD-10-CM | POA: Diagnosis not present

## 2019-09-01 DIAGNOSIS — I6789 Other cerebrovascular disease: Secondary | ICD-10-CM | POA: Diagnosis not present

## 2019-09-01 DIAGNOSIS — J189 Pneumonia, unspecified organism: Secondary | ICD-10-CM | POA: Diagnosis not present

## 2019-09-01 DIAGNOSIS — M6281 Muscle weakness (generalized): Secondary | ICD-10-CM | POA: Diagnosis not present

## 2019-09-03 DIAGNOSIS — M6281 Muscle weakness (generalized): Secondary | ICD-10-CM | POA: Diagnosis not present

## 2019-09-03 DIAGNOSIS — Z741 Need for assistance with personal care: Secondary | ICD-10-CM | POA: Diagnosis not present

## 2019-09-03 DIAGNOSIS — R262 Difficulty in walking, not elsewhere classified: Secondary | ICD-10-CM | POA: Diagnosis not present

## 2019-09-03 DIAGNOSIS — J189 Pneumonia, unspecified organism: Secondary | ICD-10-CM | POA: Diagnosis not present

## 2019-09-03 DIAGNOSIS — I6789 Other cerebrovascular disease: Secondary | ICD-10-CM | POA: Diagnosis not present

## 2019-09-03 DIAGNOSIS — J188 Other pneumonia, unspecified organism: Secondary | ICD-10-CM | POA: Diagnosis not present

## 2019-09-03 DIAGNOSIS — R1311 Dysphagia, oral phase: Secondary | ICD-10-CM | POA: Diagnosis not present

## 2019-09-04 DIAGNOSIS — M6281 Muscle weakness (generalized): Secondary | ICD-10-CM | POA: Diagnosis not present

## 2019-09-04 DIAGNOSIS — J188 Other pneumonia, unspecified organism: Secondary | ICD-10-CM | POA: Diagnosis not present

## 2019-09-04 DIAGNOSIS — Z741 Need for assistance with personal care: Secondary | ICD-10-CM | POA: Diagnosis not present

## 2019-09-04 DIAGNOSIS — J189 Pneumonia, unspecified organism: Secondary | ICD-10-CM | POA: Diagnosis not present

## 2019-09-04 DIAGNOSIS — R1311 Dysphagia, oral phase: Secondary | ICD-10-CM | POA: Diagnosis not present

## 2019-09-04 DIAGNOSIS — R262 Difficulty in walking, not elsewhere classified: Secondary | ICD-10-CM | POA: Diagnosis not present

## 2019-09-04 DIAGNOSIS — I6789 Other cerebrovascular disease: Secondary | ICD-10-CM | POA: Diagnosis not present

## 2019-09-05 DIAGNOSIS — J189 Pneumonia, unspecified organism: Secondary | ICD-10-CM | POA: Diagnosis not present

## 2019-09-05 DIAGNOSIS — I6789 Other cerebrovascular disease: Secondary | ICD-10-CM | POA: Diagnosis not present

## 2019-09-05 DIAGNOSIS — R262 Difficulty in walking, not elsewhere classified: Secondary | ICD-10-CM | POA: Diagnosis not present

## 2019-09-05 DIAGNOSIS — R1311 Dysphagia, oral phase: Secondary | ICD-10-CM | POA: Diagnosis not present

## 2019-09-05 DIAGNOSIS — J188 Other pneumonia, unspecified organism: Secondary | ICD-10-CM | POA: Diagnosis not present

## 2019-09-05 DIAGNOSIS — M6281 Muscle weakness (generalized): Secondary | ICD-10-CM | POA: Diagnosis not present

## 2019-09-05 DIAGNOSIS — Z741 Need for assistance with personal care: Secondary | ICD-10-CM | POA: Diagnosis not present

## 2019-09-06 DIAGNOSIS — R262 Difficulty in walking, not elsewhere classified: Secondary | ICD-10-CM | POA: Diagnosis not present

## 2019-09-06 DIAGNOSIS — J189 Pneumonia, unspecified organism: Secondary | ICD-10-CM | POA: Diagnosis not present

## 2019-09-06 DIAGNOSIS — R1311 Dysphagia, oral phase: Secondary | ICD-10-CM | POA: Diagnosis not present

## 2019-09-06 DIAGNOSIS — I6789 Other cerebrovascular disease: Secondary | ICD-10-CM | POA: Diagnosis not present

## 2019-09-06 DIAGNOSIS — J188 Other pneumonia, unspecified organism: Secondary | ICD-10-CM | POA: Diagnosis not present

## 2019-09-06 DIAGNOSIS — M6281 Muscle weakness (generalized): Secondary | ICD-10-CM | POA: Diagnosis not present

## 2019-09-06 DIAGNOSIS — Z1159 Encounter for screening for other viral diseases: Secondary | ICD-10-CM | POA: Diagnosis not present

## 2019-09-06 DIAGNOSIS — Z741 Need for assistance with personal care: Secondary | ICD-10-CM | POA: Diagnosis not present

## 2019-09-11 DIAGNOSIS — Z1159 Encounter for screening for other viral diseases: Secondary | ICD-10-CM | POA: Diagnosis not present

## 2019-09-21 DIAGNOSIS — E46 Unspecified protein-calorie malnutrition: Secondary | ICD-10-CM | POA: Diagnosis not present

## 2019-09-21 DIAGNOSIS — E860 Dehydration: Secondary | ICD-10-CM | POA: Diagnosis not present

## 2019-09-21 DIAGNOSIS — R64 Cachexia: Secondary | ICD-10-CM | POA: Diagnosis not present

## 2019-09-21 DIAGNOSIS — I4891 Unspecified atrial fibrillation: Secondary | ICD-10-CM | POA: Diagnosis not present

## 2019-10-02 DIAGNOSIS — Z1159 Encounter for screening for other viral diseases: Secondary | ICD-10-CM | POA: Diagnosis not present

## 2019-10-18 DIAGNOSIS — Z1159 Encounter for screening for other viral diseases: Secondary | ICD-10-CM | POA: Diagnosis not present

## 2019-11-02 DIAGNOSIS — J189 Pneumonia, unspecified organism: Secondary | ICD-10-CM | POA: Diagnosis not present

## 2019-11-02 DIAGNOSIS — D649 Anemia, unspecified: Secondary | ICD-10-CM | POA: Diagnosis not present

## 2019-11-02 DIAGNOSIS — E46 Unspecified protein-calorie malnutrition: Secondary | ICD-10-CM | POA: Diagnosis not present

## 2019-11-02 DIAGNOSIS — E875 Hyperkalemia: Secondary | ICD-10-CM | POA: Diagnosis not present

## 2019-11-08 DIAGNOSIS — E46 Unspecified protein-calorie malnutrition: Secondary | ICD-10-CM | POA: Diagnosis not present

## 2019-11-08 DIAGNOSIS — D649 Anemia, unspecified: Secondary | ICD-10-CM | POA: Diagnosis not present

## 2019-11-08 DIAGNOSIS — I4891 Unspecified atrial fibrillation: Secondary | ICD-10-CM | POA: Diagnosis not present

## 2019-11-15 DIAGNOSIS — R319 Hematuria, unspecified: Secondary | ICD-10-CM | POA: Diagnosis not present

## 2019-11-15 DIAGNOSIS — N390 Urinary tract infection, site not specified: Secondary | ICD-10-CM | POA: Diagnosis not present

## 2019-11-15 DIAGNOSIS — R918 Other nonspecific abnormal finding of lung field: Secondary | ICD-10-CM | POA: Diagnosis not present

## 2019-11-15 DIAGNOSIS — J188 Other pneumonia, unspecified organism: Secondary | ICD-10-CM | POA: Diagnosis not present

## 2019-11-15 DIAGNOSIS — J189 Pneumonia, unspecified organism: Secondary | ICD-10-CM | POA: Diagnosis not present

## 2019-11-16 DIAGNOSIS — J189 Pneumonia, unspecified organism: Secondary | ICD-10-CM | POA: Diagnosis not present

## 2019-11-17 ENCOUNTER — Emergency Department (HOSPITAL_COMMUNITY): Payer: Medicare Other

## 2019-11-17 ENCOUNTER — Encounter (HOSPITAL_COMMUNITY): Payer: Self-pay | Admitting: Emergency Medicine

## 2019-11-17 ENCOUNTER — Other Ambulatory Visit: Payer: Self-pay

## 2019-11-17 ENCOUNTER — Inpatient Hospital Stay (HOSPITAL_COMMUNITY)
Admission: EM | Admit: 2019-11-17 | Discharge: 2019-11-20 | DRG: 871 | Disposition: A | Discharge status: Hospice | Payer: Medicare Other | Source: Skilled Nursing Facility | Attending: Internal Medicine | Admitting: Internal Medicine

## 2019-11-17 DIAGNOSIS — I480 Paroxysmal atrial fibrillation: Secondary | ICD-10-CM | POA: Diagnosis not present

## 2019-11-17 DIAGNOSIS — Z951 Presence of aortocoronary bypass graft: Secondary | ICD-10-CM | POA: Diagnosis not present

## 2019-11-17 DIAGNOSIS — I255 Ischemic cardiomyopathy: Secondary | ICD-10-CM | POA: Diagnosis present

## 2019-11-17 DIAGNOSIS — Z8673 Personal history of transient ischemic attack (TIA), and cerebral infarction without residual deficits: Secondary | ICD-10-CM

## 2019-11-17 DIAGNOSIS — W19XXXA Unspecified fall, initial encounter: Secondary | ICD-10-CM

## 2019-11-17 DIAGNOSIS — J690 Pneumonitis due to inhalation of food and vomit: Secondary | ICD-10-CM | POA: Diagnosis present

## 2019-11-17 DIAGNOSIS — F329 Major depressive disorder, single episode, unspecified: Secondary | ICD-10-CM | POA: Diagnosis present

## 2019-11-17 DIAGNOSIS — J189 Pneumonia, unspecified organism: Secondary | ICD-10-CM | POA: Diagnosis present

## 2019-11-17 DIAGNOSIS — I130 Hypertensive heart and chronic kidney disease with heart failure and stage 1 through stage 4 chronic kidney disease, or unspecified chronic kidney disease: Secondary | ICD-10-CM | POA: Diagnosis present

## 2019-11-17 DIAGNOSIS — N179 Acute kidney failure, unspecified: Secondary | ICD-10-CM | POA: Diagnosis not present

## 2019-11-17 DIAGNOSIS — R791 Abnormal coagulation profile: Secondary | ICD-10-CM | POA: Diagnosis present

## 2019-11-17 DIAGNOSIS — A419 Sepsis, unspecified organism: Secondary | ICD-10-CM | POA: Diagnosis not present

## 2019-11-17 DIAGNOSIS — F419 Anxiety disorder, unspecified: Secondary | ICD-10-CM | POA: Diagnosis present

## 2019-11-17 DIAGNOSIS — F0390 Unspecified dementia without behavioral disturbance: Secondary | ICD-10-CM | POA: Diagnosis present

## 2019-11-17 DIAGNOSIS — I10 Essential (primary) hypertension: Secondary | ICD-10-CM | POA: Diagnosis not present

## 2019-11-17 DIAGNOSIS — R7401 Elevation of levels of liver transaminase levels: Secondary | ICD-10-CM | POA: Diagnosis present

## 2019-11-17 DIAGNOSIS — T462X1A Poisoning by other antidysrhythmic drugs, accidental (unintentional), initial encounter: Secondary | ICD-10-CM

## 2019-11-17 DIAGNOSIS — R52 Pain, unspecified: Secondary | ICD-10-CM | POA: Diagnosis not present

## 2019-11-17 DIAGNOSIS — Z66 Do not resuscitate: Secondary | ICD-10-CM | POA: Diagnosis present

## 2019-11-17 DIAGNOSIS — I5042 Chronic combined systolic (congestive) and diastolic (congestive) heart failure: Secondary | ICD-10-CM | POA: Diagnosis present

## 2019-11-17 DIAGNOSIS — K922 Gastrointestinal hemorrhage, unspecified: Secondary | ICD-10-CM | POA: Diagnosis present

## 2019-11-17 DIAGNOSIS — G9341 Metabolic encephalopathy: Secondary | ICD-10-CM | POA: Diagnosis present

## 2019-11-17 DIAGNOSIS — N1831 Chronic kidney disease, stage 3a: Secondary | ICD-10-CM | POA: Diagnosis not present

## 2019-11-17 DIAGNOSIS — R0902 Hypoxemia: Secondary | ICD-10-CM | POA: Diagnosis not present

## 2019-11-17 DIAGNOSIS — Z7189 Other specified counseling: Secondary | ICD-10-CM

## 2019-11-17 DIAGNOSIS — Z7982 Long term (current) use of aspirin: Secondary | ICD-10-CM

## 2019-11-17 DIAGNOSIS — Z515 Encounter for palliative care: Secondary | ICD-10-CM | POA: Diagnosis not present

## 2019-11-17 DIAGNOSIS — S299XXA Unspecified injury of thorax, initial encounter: Secondary | ICD-10-CM | POA: Diagnosis not present

## 2019-11-17 DIAGNOSIS — N189 Chronic kidney disease, unspecified: Secondary | ICD-10-CM | POA: Diagnosis present

## 2019-11-17 DIAGNOSIS — I340 Nonrheumatic mitral (valve) insufficiency: Secondary | ICD-10-CM | POA: Diagnosis not present

## 2019-11-17 DIAGNOSIS — J9610 Chronic respiratory failure, unspecified whether with hypoxia or hypercapnia: Secondary | ICD-10-CM | POA: Diagnosis not present

## 2019-11-17 DIAGNOSIS — S199XXA Unspecified injury of neck, initial encounter: Secondary | ICD-10-CM | POA: Diagnosis not present

## 2019-11-17 DIAGNOSIS — H3530 Unspecified macular degeneration: Secondary | ICD-10-CM | POA: Diagnosis present

## 2019-11-17 DIAGNOSIS — E785 Hyperlipidemia, unspecified: Secondary | ICD-10-CM | POA: Diagnosis present

## 2019-11-17 DIAGNOSIS — S3991XA Unspecified injury of abdomen, initial encounter: Secondary | ICD-10-CM | POA: Diagnosis not present

## 2019-11-17 DIAGNOSIS — D62 Acute posthemorrhagic anemia: Secondary | ICD-10-CM | POA: Diagnosis present

## 2019-11-17 DIAGNOSIS — I959 Hypotension, unspecified: Secondary | ICD-10-CM | POA: Diagnosis not present

## 2019-11-17 DIAGNOSIS — Z8249 Family history of ischemic heart disease and other diseases of the circulatory system: Secondary | ICD-10-CM

## 2019-11-17 DIAGNOSIS — I361 Nonrheumatic tricuspid (valve) insufficiency: Secondary | ICD-10-CM | POA: Diagnosis not present

## 2019-11-17 DIAGNOSIS — Z03818 Encounter for observation for suspected exposure to other biological agents ruled out: Secondary | ICD-10-CM | POA: Diagnosis not present

## 2019-11-17 DIAGNOSIS — Z79890 Hormone replacement therapy (postmenopausal): Secondary | ICD-10-CM

## 2019-11-17 DIAGNOSIS — Z20822 Contact with and (suspected) exposure to covid-19: Secondary | ICD-10-CM | POA: Diagnosis present

## 2019-11-17 DIAGNOSIS — Z9181 History of falling: Secondary | ICD-10-CM

## 2019-11-17 DIAGNOSIS — T462X5A Adverse effect of other antidysrhythmic drugs, initial encounter: Secondary | ICD-10-CM | POA: Diagnosis present

## 2019-11-17 DIAGNOSIS — S3993XA Unspecified injury of pelvis, initial encounter: Secondary | ICD-10-CM | POA: Diagnosis not present

## 2019-11-17 DIAGNOSIS — H547 Unspecified visual loss: Secondary | ICD-10-CM | POA: Diagnosis present

## 2019-11-17 DIAGNOSIS — Z87891 Personal history of nicotine dependence: Secondary | ICD-10-CM

## 2019-11-17 DIAGNOSIS — K921 Melena: Secondary | ICD-10-CM | POA: Diagnosis present

## 2019-11-17 DIAGNOSIS — E032 Hypothyroidism due to medicaments and other exogenous substances: Secondary | ICD-10-CM | POA: Diagnosis not present

## 2019-11-17 DIAGNOSIS — Z743 Need for continuous supervision: Secondary | ICD-10-CM | POA: Diagnosis not present

## 2019-11-17 DIAGNOSIS — R404 Transient alteration of awareness: Secondary | ICD-10-CM | POA: Diagnosis not present

## 2019-11-17 DIAGNOSIS — R6520 Severe sepsis without septic shock: Secondary | ICD-10-CM | POA: Diagnosis not present

## 2019-11-17 DIAGNOSIS — Z7401 Bed confinement status: Secondary | ICD-10-CM

## 2019-11-17 DIAGNOSIS — D649 Anemia, unspecified: Secondary | ICD-10-CM | POA: Diagnosis not present

## 2019-11-17 DIAGNOSIS — Z79811 Long term (current) use of aromatase inhibitors: Secondary | ICD-10-CM

## 2019-11-17 DIAGNOSIS — E872 Acidosis, unspecified: Secondary | ICD-10-CM

## 2019-11-17 DIAGNOSIS — S0990XA Unspecified injury of head, initial encounter: Secondary | ICD-10-CM | POA: Diagnosis not present

## 2019-11-17 DIAGNOSIS — Z7901 Long term (current) use of anticoagulants: Secondary | ICD-10-CM

## 2019-11-17 LAB — COMPREHENSIVE METABOLIC PANEL
ALT: 62 U/L — ABNORMAL HIGH (ref 0–44)
AST: 44 U/L — ABNORMAL HIGH (ref 15–41)
Albumin: 2.4 g/dL — ABNORMAL LOW (ref 3.5–5.0)
Alkaline Phosphatase: 64 U/L (ref 38–126)
Anion gap: 14 (ref 5–15)
BUN: 91 mg/dL — ABNORMAL HIGH (ref 8–23)
CO2: 18 mmol/L — ABNORMAL LOW (ref 22–32)
Calcium: 7.8 mg/dL — ABNORMAL LOW (ref 8.9–10.3)
Chloride: 111 mmol/L (ref 98–111)
Creatinine, Ser: 2.03 mg/dL — ABNORMAL HIGH (ref 0.44–1.00)
GFR calc Af Amer: 24 mL/min — ABNORMAL LOW (ref >60)
GFR calc non Af Amer: 21 mL/min — ABNORMAL LOW (ref >60)
Glucose, Bld: 154 mg/dL — ABNORMAL HIGH (ref 70–99)
Potassium: 3.4 mmol/L — ABNORMAL LOW (ref 3.5–5.1)
Sodium: 143 mmol/L (ref 135–145)
Total Bilirubin: 0.8 mg/dL (ref 0.3–1.2)
Total Protein: 4.9 g/dL — ABNORMAL LOW (ref 6.5–8.1)

## 2019-11-17 LAB — CBC
HCT: 13.2 % — ABNORMAL LOW (ref 36.0–46.0)
Hemoglobin: 3.4 g/dL — CL (ref 12.0–15.0)
MCH: 19.9 pg — ABNORMAL LOW (ref 26.0–34.0)
MCHC: 25.8 g/dL — ABNORMAL LOW (ref 30.0–36.0)
MCV: 77.2 fL — ABNORMAL LOW (ref 80.0–100.0)
Platelets: 353 10*3/uL (ref 150–400)
RBC: 1.71 MIL/uL — ABNORMAL LOW (ref 3.87–5.11)
RDW: 19.7 % — ABNORMAL HIGH (ref 11.5–15.5)
WBC: 18.8 10*3/uL — ABNORMAL HIGH (ref 4.0–10.5)
nRBC: 1.8 % — ABNORMAL HIGH (ref 0.0–0.2)

## 2019-11-17 LAB — APTT: aPTT: 32 seconds (ref 24–36)

## 2019-11-17 LAB — RESPIRATORY PANEL BY RT PCR (FLU A&B, COVID)
Influenza A by PCR: NEGATIVE
Influenza B by PCR: NEGATIVE
SARS Coronavirus 2 by RT PCR: NEGATIVE

## 2019-11-17 LAB — PROTIME-INR
INR: 2.2 — ABNORMAL HIGH (ref 0.8–1.2)
Prothrombin Time: 24 seconds — ABNORMAL HIGH (ref 11.4–15.2)

## 2019-11-17 LAB — ABO/RH: ABO/RH(D): A POS

## 2019-11-17 LAB — POC OCCULT BLOOD, ED: Fecal Occult Bld: POSITIVE — AB

## 2019-11-17 LAB — LACTIC ACID, PLASMA: Lactic Acid, Venous: 6.4 mmol/L (ref 0.5–1.9)

## 2019-11-17 LAB — PREPARE RBC (CROSSMATCH)

## 2019-11-17 MED ORDER — SODIUM CHLORIDE 0.9 % IV BOLUS (SEPSIS)
250.0000 mL | Freq: Once | INTRAVENOUS | Status: AC
  Administered 2019-11-17: 250 mL via INTRAVENOUS

## 2019-11-17 MED ORDER — SODIUM CHLORIDE 0.9 % IV SOLN: 10.0000 mL/h | Freq: Once | INTRAVENOUS | Status: DC

## 2019-11-17 MED ORDER — SODIUM CHLORIDE 0.9 % IV SOLN
80.0000 mg | Freq: Once | INTRAVENOUS | Status: AC
  Administered 2019-11-17: 80 mg via INTRAVENOUS
  Filled 2019-11-17: qty 80

## 2019-11-17 MED ORDER — AMIODARONE HCL 200 MG PO TABS
100.0000 mg | ORAL_TABLET | Freq: Every day | ORAL | Status: DC
  Filled 2019-11-17: qty 1

## 2019-11-17 MED ORDER — SODIUM CHLORIDE 0.9 % IV SOLN
2.0000 g | Freq: Once | INTRAVENOUS | Status: AC
  Administered 2019-11-17: 2 g via INTRAVENOUS
  Filled 2019-11-17: qty 2

## 2019-11-17 MED ORDER — VANCOMYCIN HCL 500 MG/100ML IV SOLN
500.0000 mg | INTRAVENOUS | Status: DC
  Filled 2019-11-17: qty 100

## 2019-11-17 MED ORDER — SODIUM CHLORIDE 0.9 % IV SOLN
8.0000 mg/h | INTRAVENOUS | Status: DC
  Administered 2019-11-17 – 2019-11-18 (×2): 8 mg/h via INTRAVENOUS
  Filled 2019-11-17 (×8): qty 80

## 2019-11-17 MED ORDER — EMPTY CONTAINERS FLEXIBLE MISC
900.0000 mg | Freq: Once | Status: AC
  Administered 2019-11-17: 900 mg via INTRAVENOUS
  Filled 2019-11-17: qty 90

## 2019-11-17 MED ORDER — ACETAMINOPHEN 325 MG PO TABS: 650.0000 mg | ORAL_TABLET | Freq: Four times a day (QID) | ORAL | Status: DC | PRN

## 2019-11-17 MED ORDER — ONDANSETRON HCL 4 MG/2ML IJ SOLN: 4.0000 mg | Freq: Four times a day (QID) | INTRAMUSCULAR | Status: DC | PRN

## 2019-11-17 MED ORDER — SODIUM CHLORIDE 0.9 % IV BOLUS
1000.0000 mL | Freq: Once | INTRAVENOUS | Status: AC
  Administered 2019-11-17: 1000 mL via INTRAVENOUS

## 2019-11-17 MED ORDER — VANCOMYCIN HCL IN DEXTROSE 1-5 GM/200ML-% IV SOLN
1000.0000 mg | Freq: Once | INTRAVENOUS | Status: AC
  Administered 2019-11-18: 1000 mg via INTRAVENOUS
  Filled 2019-11-17: qty 200

## 2019-11-17 MED ORDER — METRONIDAZOLE IN NACL 5-0.79 MG/ML-% IV SOLN
500.0000 mg | Freq: Once | INTRAVENOUS | Status: AC
  Administered 2019-11-17: 500 mg via INTRAVENOUS
  Filled 2019-11-17: qty 100

## 2019-11-17 MED ORDER — SODIUM CHLORIDE 0.9 % IV SOLN: INTRAVENOUS | Status: DC

## 2019-11-17 MED ORDER — ACETAMINOPHEN 650 MG RE SUPP: 650.0000 mg | Freq: Four times a day (QID) | RECTAL | Status: DC | PRN

## 2019-11-17 MED ORDER — LEVOTHYROXINE SODIUM 50 MCG PO TABS: 50.0000 ug | ORAL_TABLET | Freq: Every day | ORAL | Status: DC

## 2019-11-17 MED ORDER — ANASTROZOLE 1 MG PO TABS
1.0000 mg | ORAL_TABLET | Freq: Every day | ORAL | Status: DC
  Filled 2019-11-17 (×3): qty 1

## 2019-11-17 MED ORDER — IOHEXOL 300 MG/ML  SOLN
75.0000 mL | Freq: Once | INTRAMUSCULAR | Status: AC | PRN
  Administered 2019-11-17: 75 mL via INTRAVENOUS

## 2019-11-17 MED ORDER — ONDANSETRON HCL 4 MG PO TABS: 4.0000 mg | ORAL_TABLET | Freq: Four times a day (QID) | ORAL | Status: DC | PRN

## 2019-11-17 MED ORDER — LOSARTAN POTASSIUM 25 MG PO TABS: 25.0000 mg | ORAL_TABLET | Freq: Every day | ORAL | Status: DC

## 2019-11-17 MED ORDER — SODIUM CHLORIDE 0.9 % IV SOLN: 2.0000 g | INTRAVENOUS | Status: DC

## 2019-11-17 NOTE — Consult Note (Addendum)
Referring Provider: ED Physician Primary Care Physician:  Bonnita Nasuti, MD Primary Gastroenterologist:  Dr. Gala Romney (previously unassigned)  Date of Admission: 11/17/19 Date of Consultation: 11/17/19  Reason for Consultation: Severe anemia.  HPI:  [name] is a 86 y.o. female with a past medical history of anemia, anxiety, hypertension, CVA, chronic systolic and diastolic CHF with an EF of 45%.  She presented to the emergency department by EMS from the [facility] after an unwitnessed fall found on the floor "halfway into the bed."  Initial complaints of left arm and bilateral leg pain but noted dementia.  Also with back pain.  She is on oxygen at baseline, paroxysmal A. fib on Eliquis.  Unsure of head injury or loss of consciousness.  Minimally verbal, not conversive due to baseline dementia.  Does not follow commands.  Labs today with fecal occult blood positive, CBC with critical hemoglobin of 3.4, platelets normal.  CMP with mildly elevated AST/ALT of 44/62.  Apparent acute kidney injury with a creatinine of 2.0 (0.94 baseline).  INR elevated at 2.2.  Lactic acid significantly elevated at 6.4.   Called code sepsis patient.  With noted hypotension and blood pressure of 80/30, tachypneic but not tachycardic.  No obvious infectious source and afebrile.  Due to chest x-ray query infectious etiology versus edema.  She was given antibiotics and a liter bolus.  3 units of blood have been ordered.  Per patient niece/POA the patient was made a DNR.  Decision was made to reverse anticoagulation with Andexxa 900 mg  Leg x-ray with no fracture or dislocation.  CT imaging without acute trauma.  Today she is not coherently responsive, unable to answer questions.  Past Medical History:  Diagnosis Date  . Anemia   . Anxiety   . Chronic anxiety   . Coronary artery disease    remote CABG in 1989 and redo in 2007; has residual stable angina  . Depression   . High risk medication use    on  amiodarone  . Hyperlipidemia   . Hypertension   . Hypothyroidism 05/29/2012  . Macular degeneration   . PAF (paroxysmal atrial fibrillation) ([place])   . Stroke ([place])   . Systolic and diastolic CHF, chronic ([facility])    EF 45% per echo in 2012    Past Surgical History:  Procedure Laterality Date  . ABDOMINAL HYSTERECTOMY    . CARDIAC SURGERY    . CORONARY ARTERY BYPASS GRAFT     1989 with redo 2007 1. Redo median sternotomy, extracorporeal circulation, redo coronary    Prior to Admission medications   Medication Sig Start Date End Date Taking? Authorizing Provider  acetaminophen (TYLENOL) 325 MG tablet Take 650 mg by mouth as needed.   Yes [provider]  ALPRAZolam (XANAX) 0.25 MG tablet Take 0.25 mg by mouth 2 (two) times daily. 10/24/19  Yes [provider]  amiodarone (PACERONE) 200 MG tablet TAKE 0.5 TABLETS (100 MG TOTAL) BY MOUTH DAILY. 12/07/18  Yes Herminio Commons, MD  anastrozole (ARIMIDEX) 1 MG tablet Take 1 mg by mouth daily.   Yes [provider]  aspirin EC 81 MG tablet Take 81 mg by mouth daily.   Yes [provider]  Biotin 5000 MCG TABS Take 1 tablet by mouth daily.   Yes [provider]  Calcium Carbonate-Vit D-Min (CALTRATE 600+D PLUS PO) Take 1 tablet by mouth 2 (two) times daily.   Yes [provider]  Cholecalciferol (VITAMIN D3) 1000 units CAPS  Take 1 capsule by mouth daily.   Yes [provider]  famotidine (PEPCID) 40 MG tablet Take 40 mg by mouth 2 (two) times daily. 11/17/18  Yes [provider]  Ferrous Gluconate (IRON 27 PO) Take 1 tablet by mouth daily.   Yes [provider]  furosemide (LASIX) 40 MG tablet TAKE 1 TABLET BY MOUTH EVERY DAY 05/17/19  Yes Herminio Commons, MD  Hypromellose (ARTIFICIAL TEARS OP) Apply 2 drops to eye as needed.   Yes [provider]  KLOR-CON M20 20 MEQ tablet TAKE 1 TABLET BY MOUTH EVERY DAY 12/07/18  Yes Herminio Commons, MD   levothyroxine (SYNTHROID, LEVOTHROID) 50 MCG tablet Take 50 mcg by mouth daily. 11/17/18  Yes [provider]  loratadine (CLARITIN) 10 MG tablet Take 10 mg by mouth daily as needed for allergies.   Yes [provider]  losartan (COZAAR) 25 MG tablet Take 25 mg by mouth daily.   Yes [provider]  metoprolol succinate (TOPROL-XL) 25 MG 24 hr tablet TAKE 1 TABLET BY MOUTH EVERY DAY 12/07/18  Yes Herminio Commons, MD  mirtazapine (REMERON) 7.5 MG tablet Take 7.5 mg by mouth at bedtime.   Yes [provider]  nitroGLYCERIN (NITROSTAT) 0.4 MG SL tablet DISSOLVE 1 TABLET UNDER TONGUE EVERY 5 MINUTES AS DIRECTED AS NEEDED FOR CHEST PAIN Patient taking differently: Place 0.4 mg under the tongue every 5 (five) minutes as needed for chest pain.  12/15/16  Yes Herminio Commons, MD  sertraline (ZOLOFT) 25 MG tablet Take 25 mg by mouth daily.   Yes [provider]  tetrahydrozoline-zinc (VISINE-AC) 0.05-0.25 % ophthalmic solution 2 drops as needed.   Yes [provider]  vitamin B-12 (CYANOCOBALAMIN) 1000 MCG tablet Take 1,000 mcg by mouth every other day.   Yes [provider]    Current Facility-Administered Medications  Medication Dose Route Frequency Provider Last Rate Last Admin  . 0.9 %  sodium chloride infusion  10 mL/hr Intravenous Once Petrucelli, Samantha R, PA-C      . 0.9 %  sodium chloride infusion  10 mL/hr Intravenous Once Petrucelli, Samantha R, PA-C      . ceFEPIme (MAXIPIME) 2 g in sodium chloride 0.9 % 100 mL IVPB  2 g Intravenous Once Petrucelli, Samantha R, PA-C 200 mL/hr at 11/17/19 1625 2 g at 11/17/19 1625  . [START ON 11/18/2019] ceFEPIme (MAXIPIME) 2 g in sodium chloride 0.9 % 100 mL IVPB  2 g Intravenous Q24H Coffee, Donna Christen, [facility]      . coag fact Xa recombinant (ANDEXXA) low dose infusion 900 mg  900 mg Intravenous Once Petrucelli, Samantha R, PA-C      . metroNIDAZOLE (FLAGYL) IVPB 500 mg  500 mg Intravenous Once  Petrucelli, Samantha R, PA-C      . pantoprazole (PROTONIX) 80 mg in sodium chloride 0.9 % 250 mL (0.32 mg/mL) infusion  8 mg/hr Intravenous Continuous Petrucelli, Samantha R, PA-C 25 mL/hr at 11/17/19 1618 8 mg/hr at 11/17/19 1618  . sodium chloride 0.9 % bolus 250 mL  250 mL Intravenous Once Petrucelli, Samantha R, PA-C      . vancomycin (VANCOCIN) IVPB 1000 mg/200 mL premix  1,000 mg Intravenous Once Petrucelli, Samantha R, PA-C      . [START ON 11/19/2019] vancomycin (VANCOREADY) IVPB 500 mg/100 mL  500 mg Intravenous Q48H Coffee, Donna Christen, [facility]       Current Outpatient Medications  Medication Sig Dispense Refill  . acetaminophen (TYLENOL) 325 MG tablet  Take 650 mg by mouth as needed.    . ALPRAZolam (XANAX) 0.25 MG tablet Take 0.25 mg by mouth 2 (two) times daily.    Marland Kitchen amiodarone (PACERONE) 200 MG tablet TAKE 0.5 TABLETS (100 MG TOTAL) BY MOUTH DAILY. 45 tablet 2  . anastrozole (ARIMIDEX) 1 MG tablet Take 1 mg by mouth daily.    Marland Kitchen aspirin EC 81 MG tablet Take 81 mg by mouth daily.    . Biotin 5000 MCG TABS Take 1 tablet by mouth daily.    . Calcium Carbonate-Vit D-Min (CALTRATE 600+D PLUS PO) Take 1 tablet by mouth 2 (two) times daily.    . Cholecalciferol (VITAMIN D3) 1000 units CAPS Take 1 capsule by mouth daily.    . famotidine (PEPCID) 40 MG tablet Take 40 mg by mouth 2 (two) times daily.    . Ferrous Gluconate (IRON 27 PO) Take 1 tablet by mouth daily.    . furosemide (LASIX) 40 MG tablet TAKE 1 TABLET BY MOUTH EVERY DAY 90 tablet 1  . Hypromellose (ARTIFICIAL TEARS OP) Apply 2 drops to eye as needed.    Marland Kitchen KLOR-CON M20 20 MEQ tablet TAKE 1 TABLET BY MOUTH EVERY DAY 90 tablet 2  . levothyroxine (SYNTHROID, LEVOTHROID) 50 MCG tablet Take 50 mcg by mouth daily.    Marland Kitchen loratadine (CLARITIN) 10 MG tablet Take 10 mg by mouth daily as needed for allergies.    Marland Kitchen losartan (COZAAR) 25 MG tablet Take 25 mg by mouth daily.    . metoprolol succinate (TOPROL-XL) 25 MG 24 hr tablet TAKE 1 TABLET BY  MOUTH EVERY DAY 90 tablet 2  . mirtazapine (REMERON) 7.5 MG tablet Take 7.5 mg by mouth at bedtime.    . nitroGLYCERIN (NITROSTAT) 0.4 MG SL tablet DISSOLVE 1 TABLET UNDER TONGUE EVERY 5 MINUTES AS DIRECTED AS NEEDED FOR CHEST PAIN (Patient taking differently: Place 0.4 mg under the tongue every 5 (five) minutes as needed for chest pain. ) 25 tablet 3  . sertraline (ZOLOFT) 25 MG tablet Take 25 mg by mouth daily.    Marland Kitchen tetrahydrozoline-zinc (VISINE-AC) 0.05-0.25 % ophthalmic solution 2 drops as needed.    . vitamin B-12 (CYANOCOBALAMIN) 1000 MCG tablet Take 1,000 mcg by mouth every other day.      Allergies as of 11/17/2019  . (No Known Allergies)    Family History  Problem Relation Age of Onset  . Heart disease Mother   . Aneurysm Mother     Social History   Socioeconomic History  . Marital status: Widowed    Spouse name: Not on file  . Number of children: Not on file  . Years of education: Not on file  . Highest education level: Not on file  Occupational History  . Not on file  Tobacco Use  . Smoking status: Former Smoker    Packs/day: 0.25    Years: 2.00    Pack years: 0.50    Types: Cigarettes    Start date: 12/12/1945    Quit date: 12/13/1947    Years since quitting: 71.9  . Smokeless tobacco: Never Used  Substance and Sexual Activity  . Alcohol use: No    Alcohol/week: 0.0 standard drinks  . Drug use: No  . Sexual activity: Never  Other Topics Concern  . Not on file  Social History Narrative  . Not on file   Social Determinants of Health   Financial Resource Strain:   . Difficulty of Paying Living Expenses: Not on file  Food Insecurity:   .  Worried About Charity fundraiser in the Last Year: Not on file  . Ran Out of Food in the Last Year: Not on file  Transportation Needs:   . Lack of Transportation (Medical): Not on file  . Lack of Transportation (Non-Medical): Not on file  Physical Activity:   . Days of Exercise per Week: Not on file  . Minutes of  Exercise per Session: Not on file  Stress:   . Feeling of Stress : Not on file  Social Connections:   . Frequency of Communication with Friends and Family: Not on file  . Frequency of Social Gatherings with Friends and Family: Not on file  . Attends Religious Services: Not on file  . Active Member of Clubs or Organizations: Not on file  . Attends Archivist Meetings: Not on file  . Marital Status: Not on file  Intimate Partner Violence:   . Fear of Current or Ex-Partner: Not on file  . Emotionally Abused: Not on file  . Physically Abused: Not on file  . Sexually Abused: Not on file    Review of Systems: Unable to complete due to clinical condition  Physical Exam: Vital signs in last 24 hours: Temp:  [96 F (35.6 C)-97.5 F (36.4 C)] 96 F (35.6 C) (01/15 1627) Pulse Rate:  [31-67] 65 (01/15 1627) Resp:  [18-30] 30 (01/15 1627) BP: (73-107)/(23-90) 92/32 (01/15 1627) SpO2:  [71 %-99 %] 97 % (01/15 1627) Weight:  [40.8 kg] 40.8 kg (01/15 1223)    Somewhat difficult exam due to patient pulling at provider during exam. General:   Alert,  Well-developed, in NAD. Appears pale Head:  Normocephalic and atraumatic. Eyes:  Sclera clear, no icterus. Conjunctiva pale. Neck:  Supple; no masses or thyromegaly. Lungs:  Clear throughout to auscultation. No wheezes, crackles, or rhonchi. No acute distress. Heart:  Regular rate and rhythm; no murmurs, clicks, rubs,  or gallops. Abdomen:  Soft, not apparently ttender and nondistended. No masses, hepatosplenomegaly or hernias noted. Normal bowel sounds, without guarding, and without rebound.   Rectal:  Deferred until time of colonoscopy.   Msk:  Symmetrical without gross deformities. Extremities:  Without clubbing or edema. Neurologic:  Alert but not oriented consistent with history of dementia. Skin:  Intact without significant lesions or rashes. Psych:  Alert and cooperative. Normal mood and affect.  Intake/Output from previous  day: No intake/output data recorded. Intake/Output this shift: Total I/O In: 315 [Blood:315] Out: -   Lab Results: Recent Labs    11/17/19 1447  WBC 18.8*  HGB 3.4*  HCT 13.2*  PLT 353   BMET Recent Labs    11/17/19 1447  NA 143  K 3.4*  CL 111  CO2 18*  GLUCOSE 154*  BUN 91*  CREATININE 2.03*  CALCIUM 7.8*   LFT Recent Labs    11/17/19 1447  PROT 4.9*  ALBUMIN 2.4*  AST 44*  ALT 62*  ALKPHOS 64  BILITOT 0.8   PT/INR Recent Labs    11/17/19 1447  LABPROT 24.0*  INR 2.2*   Hepatitis Panel No results for input(s): HEPBSAG, HCVAB, HEPAIGM, HEPBIGM in the last 72 hours. C-Diff No results for input(s): CDIFFTOX in the last 72 hours.  Studies/Results: CT HEAD WO CONTRAST  Result Date: 11/17/2019 CLINICAL DATA:  Unwitnessed fall. Found down. History of dementia and stroke. EXAM: CT HEAD WITHOUT CONTRAST CT CERVICAL SPINE WITHOUT CONTRAST TECHNIQUE: Multidetector CT imaging of the head and cervical spine was performed following the standard protocol without  intravenous contrast. Multiplanar CT image reconstructions of the cervical spine were also generated. COMPARISON:  CT head 07/08/2019. FINDINGS: CT HEAD FINDINGS Brain: There is no evidence of acute intracranial hemorrhage, mass lesion, brain edema or extra-axial fluid collection. Stable mild atrophy with mild prominence of the ventricles and subarachnoid spaces for age. Mild chronic small vessel ischemic changes in the periventricular white matter. There is no CT evidence of acute cortical infarction. Vascular: Prominent intracranial vascular calcifications. No hyperdense vessel identified. Skull: Negative for fracture or focal lesion. Sinuses/Orbits: Chronic mucosal thickening in the sphenoid sinus. The additional paranasal sinuses, mastoid air cells and middle ears are clear. No significant orbital findings. Other: None. CT CERVICAL SPINE FINDINGS Alignment: There is a degenerative anterolisthesis at the C2-3,  C3-4 and C7-T1 levels. Skull base and vertebrae: No evidence of acute fracture or traumatic subluxation. Soft tissues and spinal canal: No prevertebral fluid or swelling. No visible canal hematoma. Disc levels: There is multilevel cervical spondylosis, most advanced at the C4-5, C5-6 and C6-7 levels where there are posterior osteophytes contributing to mild foraminal narrowing bilaterally. Asymmetric facet hypertrophy is noted on the left at C3-4, contributing to moderate left foraminal narrowing. Upper chest: Biapical pleuroparenchymal scarring. Right thyroid nodularity is noted, measuring less than 1.5 cm in diameter, unlikely to be clinically significant given the patient's age. No followup imaging recommended (ref: J Am Coll Radiol. 2015 Feb;12(2): 143-50). Other: None. IMPRESSION: 1. No acute intracranial or calvarial findings. Stable mild atrophy and chronic small vessel ischemic changes. 2. No evidence of acute cervical spine fracture, traumatic subluxation or static signs of instability. 3. Multilevel cervical spondylosis as described. Electronically Signed   By: Richardean Sale M.D.   On: 11/17/2019 16:30   CT CHEST W CONTRAST  Result Date: 11/17/2019 CLINICAL DATA:  86 year old female with history of abdominal trauma from an unwitnessed fall. EXAM: CT CHEST, ABDOMEN, AND PELVIS WITH CONTRAST TECHNIQUE: Multidetector CT imaging of the chest, abdomen and pelvis was performed following the standard protocol during bolus administration of intravenous contrast. CONTRAST:  34mL OMNIPAQUE IOHEXOL 300 MG/ML  SOLN COMPARISON:  No priors. FINDINGS: CT CHEST FINDINGS Cardiovascular: Heart size is mildly enlarged. There is no significant pericardial fluid, thickening or pericardial calcification. There is aortic atherosclerosis, as well as atherosclerosis of the great vessels of the mediastinum and the coronary arteries, including calcified atherosclerotic plaque in the left main, left anterior descending, left  circumflex and right coronary arteries. Status post median sternotomy for CABG including LIMA to the LAD. Mediastinum/Nodes: No posttraumatic hematoma identified in the mediastinum. No pathologically enlarged mediastinal or hilar lymph nodes. Esophagus is unremarkable in appearance. No axillary lymphadenopathy. Lungs/Pleura: Small bilateral pleural effusions (right greater than left). Mild ground-glass attenuation and interlobular septal thickening in the lungs bilaterally, suggesting a background of mild interstitial pulmonary edema. 8 x 4 mm (mean diameter 6 mm) pulmonary nodule in the superior segment of the left lower lobe (axial image 55 of series 4). No other larger more suspicious appearing pulmonary nodules or masses are noted. Musculoskeletal: No acute displaced fractures or aggressive appearing lytic or blastic lesions are noted in the visualized portions of the skeleton. Status post median sternotomy. CT ABDOMEN PELVIS FINDINGS Hepatobiliary: No signs of significant acute traumatic injury to the liver. No suspicious cystic or solid hepatic lesions. Status post cholecystectomy. No intrahepatic biliary ductal dilatation. Common bile duct is dilated measuring 8 mm in the porta hepatis, within normal limits for this post cholecystectomy patient. Pancreas: No signs of significant acute traumatic  injury to the pancreas. No pancreatic mass. No pancreatic ductal dilatation. No pancreatic or peripancreatic fluid collections or inflammatory changes. Spleen: No signs of acute traumatic injury to the spleen. Adrenals/Urinary Tract: No signs of acute traumatic injury to either kidney or adrenal gland. Severe atrophy of the left kidney. Right kidney is normal in appearance. Thickening of the adrenal glands bilaterally, likely reflective of adrenal hyperplasia. No hydroureteronephrosis. Urinary bladder is nearly completely decompressed, but appears intact and is otherwise unremarkable in appearance. Stomach/Bowel: No  evidence of significant acute traumatic injury to the hollow viscera. Normal appearance of the stomach. No pathologic dilatation of small bowel or colon. Large amount of stool in the rectal vault which may indicate constipation. A few scattered colonic diverticulae are noted, most evident in the sigmoid colon, without surrounding inflammatory changes to suggest an acute diverticulitis at this time. The appendix is not confidently identified and may be surgically absent. Regardless, there are no inflammatory changes noted adjacent to the cecum to suggest the presence of an acute appendicitis at this time. Vascular/Lymphatic: No evidence of significant acute traumatic injury to the abdominal aorta or the major arteries or veins of the abdomen or pelvis. Extensive aortic atherosclerosis with fusiform ectasia of the infrarenal abdominal aorta which measures up to 2.3 x 2.0 cm. No lymphadenopathy noted in the abdomen or pelvis. Reproductive: Status post hysterectomy. Ovaries are not confidently identified may be surgically absent or atrophic. Other: No high attenuation fluid collection within the peritoneal cavity or retroperitoneum to suggest significant posttraumatic hemorrhage. No significant volume of ascites. No pneumoperitoneum. Musculoskeletal: There are no acute displaced fractures or aggressive appearing lytic or blastic lesions noted in the visualized portions of the skeleton. IMPRESSION: 1. No evidence of significant acute traumatic injury to the chest, abdomen or pelvis. 2. Cardiomegaly with small bilateral pleural effusions and probable interstitial pulmonary edema; imaging findings suggestive of congestive heart failure. 3. Aortic atherosclerosis, in addition to left main and 3 vessel coronary artery disease. There is also fusiform ectasia of the infrarenal abdominal aorta which measures up to 2.3 x 2.0 cm in diameter. 4. Small pulmonary nodule with a mean diameter of 6 mm in the superior segment of the  left lower lobe. This is nonspecific, but is subpleural in location and statistically likely a subpleural lymph node. Repeat noncontrast chest CT could be considered in 1 year to re-evaluate this finding, depending on the patient's clinical situation (this is considered optional given the patient's advanced age and high likelihood of a benign lesion). 5. Large volume of stool in the rectal vault which may suggest constipation or fecal impaction. 6. Mild colonic diverticulosis without evidence of acute diverticulitis at this time. 7. Additional incidental findings, as above. Electronically Signed   By: Vinnie Langton M.D.   On: 11/17/2019 16:34   CT CERVICAL SPINE WO CONTRAST  Result Date: 11/17/2019 CLINICAL DATA:  Unwitnessed fall. Found down. History of dementia and stroke. EXAM: CT HEAD WITHOUT CONTRAST CT CERVICAL SPINE WITHOUT CONTRAST TECHNIQUE: Multidetector CT imaging of the head and cervical spine was performed following the standard protocol without intravenous contrast. Multiplanar CT image reconstructions of the cervical spine were also generated. COMPARISON:  CT head 07/08/2019. FINDINGS: CT HEAD FINDINGS Brain: There is no evidence of acute intracranial hemorrhage, mass lesion, brain edema or extra-axial fluid collection. Stable mild atrophy with mild prominence of the ventricles and subarachnoid spaces for age. Mild chronic small vessel ischemic changes in the periventricular white matter. There is no CT evidence of acute  cortical infarction. Vascular: Prominent intracranial vascular calcifications. No hyperdense vessel identified. Skull: Negative for fracture or focal lesion. Sinuses/Orbits: Chronic mucosal thickening in the sphenoid sinus. The additional paranasal sinuses, mastoid air cells and middle ears are clear. No significant orbital findings. Other: None. CT CERVICAL SPINE FINDINGS Alignment: There is a degenerative anterolisthesis at the C2-3, C3-4 and C7-T1 levels. Skull base and  vertebrae: No evidence of acute fracture or traumatic subluxation. Soft tissues and spinal canal: No prevertebral fluid or swelling. No visible canal hematoma. Disc levels: There is multilevel cervical spondylosis, most advanced at the C4-5, C5-6 and C6-7 levels where there are posterior osteophytes contributing to mild foraminal narrowing bilaterally. Asymmetric facet hypertrophy is noted on the left at C3-4, contributing to moderate left foraminal narrowing. Upper chest: Biapical pleuroparenchymal scarring. Right thyroid nodularity is noted, measuring less than 1.5 cm in diameter, unlikely to be clinically significant given the patient's age. No followup imaging recommended (ref: J Am Coll Radiol. 2015 Feb;12(2): 143-50). Other: None. IMPRESSION: 1. No acute intracranial or calvarial findings. Stable mild atrophy and chronic small vessel ischemic changes. 2. No evidence of acute cervical spine fracture, traumatic subluxation or static signs of instability. 3. Multilevel cervical spondylosis as described. Electronically Signed   By: Richardean Sale M.D.   On: 11/17/2019 16:30   CT ABDOMEN PELVIS W CONTRAST  Result Date: 11/17/2019 CLINICAL DATA:  86 year old female with history of abdominal trauma from an unwitnessed fall. EXAM: CT CHEST, ABDOMEN, AND PELVIS WITH CONTRAST TECHNIQUE: Multidetector CT imaging of the chest, abdomen and pelvis was performed following the standard protocol during bolus administration of intravenous contrast. CONTRAST:  2mL OMNIPAQUE IOHEXOL 300 MG/ML  SOLN COMPARISON:  No priors. FINDINGS: CT CHEST FINDINGS Cardiovascular: Heart size is mildly enlarged. There is no significant pericardial fluid, thickening or pericardial calcification. There is aortic atherosclerosis, as well as atherosclerosis of the great vessels of the mediastinum and the coronary arteries, including calcified atherosclerotic plaque in the left main, left anterior descending, left circumflex and right coronary  arteries. Status post median sternotomy for CABG including LIMA to the LAD. Mediastinum/Nodes: No posttraumatic hematoma identified in the mediastinum. No pathologically enlarged mediastinal or hilar lymph nodes. Esophagus is unremarkable in appearance. No axillary lymphadenopathy. Lungs/Pleura: Small bilateral pleural effusions (right greater than left). Mild ground-glass attenuation and interlobular septal thickening in the lungs bilaterally, suggesting a background of mild interstitial pulmonary edema. 8 x 4 mm (mean diameter 6 mm) pulmonary nodule in the superior segment of the left lower lobe (axial image 55 of series 4). No other larger more suspicious appearing pulmonary nodules or masses are noted. Musculoskeletal: No acute displaced fractures or aggressive appearing lytic or blastic lesions are noted in the visualized portions of the skeleton. Status post median sternotomy. CT ABDOMEN PELVIS FINDINGS Hepatobiliary: No signs of significant acute traumatic injury to the liver. No suspicious cystic or solid hepatic lesions. Status post cholecystectomy. No intrahepatic biliary ductal dilatation. Common bile duct is dilated measuring 8 mm in the porta hepatis, within normal limits for this post cholecystectomy patient. Pancreas: No signs of significant acute traumatic injury to the pancreas. No pancreatic mass. No pancreatic ductal dilatation. No pancreatic or peripancreatic fluid collections or inflammatory changes. Spleen: No signs of acute traumatic injury to the spleen. Adrenals/Urinary Tract: No signs of acute traumatic injury to either kidney or adrenal gland. Severe atrophy of the left kidney. Right kidney is normal in appearance. Thickening of the adrenal glands bilaterally, likely reflective of adrenal hyperplasia. No hydroureteronephrosis. Urinary  bladder is nearly completely decompressed, but appears intact and is otherwise unremarkable in appearance. Stomach/Bowel: No evidence of significant acute  traumatic injury to the hollow viscera. Normal appearance of the stomach. No pathologic dilatation of small bowel or colon. Large amount of stool in the rectal vault which may indicate constipation. A few scattered colonic diverticulae are noted, most evident in the sigmoid colon, without surrounding inflammatory changes to suggest an acute diverticulitis at this time. The appendix is not confidently identified and may be surgically absent. Regardless, there are no inflammatory changes noted adjacent to the cecum to suggest the presence of an acute appendicitis at this time. Vascular/Lymphatic: No evidence of significant acute traumatic injury to the abdominal aorta or the major arteries or veins of the abdomen or pelvis. Extensive aortic atherosclerosis with fusiform ectasia of the infrarenal abdominal aorta which measures up to 2.3 x 2.0 cm. No lymphadenopathy noted in the abdomen or pelvis. Reproductive: Status post hysterectomy. Ovaries are not confidently identified may be surgically absent or atrophic. Other: No high attenuation fluid collection within the peritoneal cavity or retroperitoneum to suggest significant posttraumatic hemorrhage. No significant volume of ascites. No pneumoperitoneum. Musculoskeletal: There are no acute displaced fractures or aggressive appearing lytic or blastic lesions noted in the visualized portions of the skeleton. IMPRESSION: 1. No evidence of significant acute traumatic injury to the chest, abdomen or pelvis. 2. Cardiomegaly with small bilateral pleural effusions and probable interstitial pulmonary edema; imaging findings suggestive of congestive heart failure. 3. Aortic atherosclerosis, in addition to left main and 3 vessel coronary artery disease. There is also fusiform ectasia of the infrarenal abdominal aorta which measures up to 2.3 x 2.0 cm in diameter. 4. Small pulmonary nodule with a mean diameter of 6 mm in the superior segment of the left lower lobe. This is  nonspecific, but is subpleural in location and statistically likely a subpleural lymph node. Repeat noncontrast chest CT could be considered in 1 year to re-evaluate this finding, depending on the patient's clinical situation (this is considered optional given the patient's advanced age and high likelihood of a benign lesion). 5. Large volume of stool in the rectal vault which may suggest constipation or fecal impaction. 6. Mild colonic diverticulosis without evidence of acute diverticulitis at this time. 7. Additional incidental findings, as above. Electronically Signed   By: Vinnie Langton M.D.   On: 11/17/2019 16:34   DG Pelvis Portable  Result Date: 11/17/2019 CLINICAL DATA:  Un witnessed fall. Found on the floor. Complaining of left arm and bilateral leg pain. Patient has dementia. EXAM: PORTABLE PELVIS 1-2 VIEWS COMPARISON:  None. FINDINGS: No convincing fracture.  No bone lesion. Hip joints, SI joints and symphysis pubis are normally spaced and aligned. Skeletal structures are demineralized. Soft tissues are unremarkable. Visualized colon shows increased colonic stool burden. IMPRESSION: No fracture or dislocation Electronically Signed   By: Lajean Manes M.D.   On: 11/17/2019 14:01   DG Chest Port 1 View  Result Date: 11/17/2019 CLINICAL DATA:  unwitnessed fall. She was found in the floor "halfway under the bed." Patient complaining of pain to left arm and bilateral legs but patient has dementia. Also complaining of back pain. Htn/chf/ex smoker/hx cabg EXAM: PORTABLE CHEST 1 VIEW COMPARISON:  07/11/2019. FINDINGS: Hazy bilateral airspace opacities are noted, similar to the prior radiographs. Pattern is concerning for multifocal pneumonia. The opacities are new from a chest radiograph from 05/17/2016. There changes from cardiac surgery. Right coronary artery stent is noted. Cardiac silhouette top-normal in  size to slightly enlarged. No mediastinal or hilar masses. No convincing pleural effusion.  No  pneumothorax. Skeletal structures are grossly intact. IMPRESSION: 1. Hazy bilateral airspace opacities are noted in a pattern suspicious for multifocal pneumonia. However, the appearance is similar to the most recent prior chest radiographs. Findings could potentially be due to asymmetric pulmonary edema instead of infection. 2. 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 Electronically Signed   By: Lajean Manes M.D.   On: 11/17/2019 14:00     Impression: 86 year old female who presents from the nursing home with a history of moderate dementia.  She had a unwitnessed fall at the nursing home and there is a question of trauma and subsequently she was referred to the emergency department.  No mention of any history of hematochezia or melena by the nursing home or the power of attorney.  Patient unable to answer questions due to dementia status.  There is a question of trauma and multiple CTs were completed which were essentially normal.  No obvious retroperitoneal bleed or other sign of traumatic bleed on abdominal CT.  However, the patient's hemoglobin was severely decreased around 3.2.  She appeared pale consistent with her hemogram.  Chest x-ray was hazy and unsure if pulmonary edema from congestive heart failure history versus infectious etiology.  Her lactic acid was significantly elevated at 6.4 and subsequently she was called a code sepsis.  Hypotension improved with fluid bolus.  The patient is on anticoagulation with Eliquis for atrial fibrillation which is in the process of being reversed.  3 units of PRBCs are ordered and one was transfusing when I saw the patient in the ER room.  Overall anemia likely multifactorial in nature.  No overt bleeding to pinpoint a site of acute bleed.  There is a possibility of longstanding chronic bleed on anticoagulation, but this is yet to definitively declare itself.  She will need to be monitored closely for any signs of gastrointestinal bleeding that could point [place] toward  possible need for endoscopic evaluation.  In her current status, however, she is not a candidate for endoscopy given her tenuous clinical situation.  Plan: 1. Monitor for any signs of GI bleed 2. Follow hgb closely 3. Transfuse as necessary 4. Agree with prophylactic PPI for now given anemia of unknown source 5. Supportive measures   Thank you for allowing [place] to participate in the care of [name]  Walden Field, DNP, AGNP-C Adult & Gerontological Nurse Practitioner [facility]    LOS: 0 days     11/17/2019, 4:44 PM

## 2019-11-17 NOTE — Progress Notes (Signed)
Pharmacy Antibiotic Note  [name] is a 86 y.o. female admitted on 11/17/2019 with sepsis.  Pharmacy has been consulted for cefepime and vancomycin dosing.  Plan: Vancomycin 500mg  IV every 48h hours.  Goal trough 15-20 mcg/mL. cefepime 2gm iv q24h  Height: 5\' 2"  (157.5 cm) Weight: 90 lb (40.8 kg) IBW/kg (Calculated) : 50.1  Temp (24hrs), Avg:96.8 F (36 C), Min:96 F (35.6 C), Max:97.5 F (36.4 C)  Recent Labs  Lab 11/17/19 1447  WBC 18.8*  CREATININE 2.03*  LATICACIDVEN 6.4*    Estimated Creatinine Clearance: 11.6 mL/min (A) (by C-G formula based on SCr of 2.03 mg/dL (H)).    No Known Allergies  Antimicrobials this admission: 1/15 vancomycin>> 1/15 cefepime>> 1/15 metronidazole x 1   Microbiology results: 1/15 BCx: sent  1/15 UCx: sent    Thank you for allowing pharmacy to be a part of this patient's care.  Donna Christen Tarance Balan 11/17/2019 4:44 PM

## 2019-11-17 NOTE — Progress Notes (Signed)
Unable to obtain second lactate for Code Sepsis protocol due to life saving blood transfusions with difficult IV access.

## 2019-11-17 NOTE — ED Notes (Signed)
Pt difficult iv stick, one set of blood cultures obtained,

## 2019-11-17 NOTE — ED Triage Notes (Signed)
Patient from [facility] per EMS was unwitnessed fall. She was found in the floor "halfway under the bed." Patient complaining of pain to left arm and bilateral legs but patient has dementia. Also complaining of back pain.

## 2019-11-17 NOTE — H&P (Signed)
History and Physical  [name] [id number] DOB: 1926/04/14 DOA: 11/17/2019  Referring physician: Kennith Maes, PA-C, ED physician PCP: Bonnita Nasuti, MD  Outpatient Specialists:   Patient Coming From: [facility]  Chief Complaint: Found on the floor, altered mental status  HPI: [name] is a 86 y.o. female with a history of coronary artery disease with CABG in 12 with revision in 2007, atrial fibrillation on amiodarone, hypothyroidism, history of stroke, systolic and diastolic heart failure with EF of 45% in 2012, dementia.  Patient is resident of the [facility] and was found on the floor after unwitnessed fall fall.  Patient has dementia and was unresponsive upon arrival to the emergency department.  Emergency Department Course: Patient had lactic acidosis of 6.4.  Creatinine 2.03, white blood cell count 18.8, hemoglobin 3.4, INR 2.2.  Patient had her anticoagulation reversed with Kcentra.  Patient typed and crossmatched for 3 units of blood  Review of Systems:  Unable to provide   Past Medical History:  Diagnosis Date  . Anemia   . Anxiety   . Chronic anxiety   . Coronary artery disease    remote CABG in 1989 and redo in 2007; has residual stable angina  . Depression   . High risk medication use    on amiodarone  . Hyperlipidemia   . Hypertension   . Hypothyroidism 05/29/2012  . Macular degeneration   . PAF (paroxysmal atrial fibrillation) ([place])   . Stroke ([place])   . Systolic and diastolic CHF, chronic (HCC)    EF 45% per echo in 2012   Past Surgical History:  Procedure Laterality Date  . ABDOMINAL HYSTERECTOMY    . CARDIAC SURGERY    . CORONARY ARTERY BYPASS GRAFT     1989 with redo 2007 1. Redo median sternotomy, extracorporeal circulation, redo coronary   Social History:  reports that she quit smoking about 71 years ago. Her smoking use included cigarettes. She started smoking about 73 years ago. She has a 0.50 pack-year smoking  history. She has never used smokeless tobacco. She reports that she does not drink alcohol or use drugs. Patient lives at [facility]  No Known Allergies  Family History  Problem Relation Age of Onset  . Heart disease Mother   . Aneurysm Mother       Prior to Admission medications   Medication Sig Start Date End Date Taking? Authorizing Provider  acetaminophen (TYLENOL) 325 MG tablet Take 650 mg by mouth as needed.   Yes [provider]  ALPRAZolam (XANAX) 0.25 MG tablet Take 0.25 mg by mouth 2 (two) times daily. 10/24/19  Yes [provider]  amiodarone (PACERONE) 200 MG tablet TAKE 0.5 TABLETS (100 MG TOTAL) BY MOUTH DAILY. 12/07/18  Yes Herminio Commons, MD  anastrozole (ARIMIDEX) 1 MG tablet Take 1 mg by mouth daily.   Yes [provider]  aspirin EC 81 MG tablet Take 81 mg by mouth daily.   Yes [provider]  Biotin 5000 MCG TABS Take 1 tablet by mouth daily.   Yes [provider]  Calcium Carbonate-Vit D-Min (CALTRATE 600+D PLUS PO) Take 1 tablet by mouth 2 (two) times daily.   Yes [provider]  Cholecalciferol (VITAMIN D3) 1000 units CAPS Take 1 capsule by mouth daily.   Yes [provider]  famotidine (PEPCID) 40 MG tablet Take 40 mg by mouth 2 (two) times daily. 11/17/18  Yes [provider]  Ferrous Gluconate (IRON 27 PO)  Take 1 tablet by mouth daily.   Yes [provider]  furosemide (LASIX) 40 MG tablet TAKE 1 TABLET BY MOUTH EVERY DAY 05/17/19  Yes Herminio Commons, MD  Hypromellose (ARTIFICIAL TEARS OP) Apply 2 drops to eye as needed.   Yes [provider]  KLOR-CON M20 20 MEQ tablet TAKE 1 TABLET BY MOUTH EVERY DAY 12/07/18  Yes Herminio Commons, MD  levothyroxine (SYNTHROID, LEVOTHROID) 50 MCG tablet Take 50 mcg by mouth daily. 11/17/18  Yes [provider]  loratadine (CLARITIN) 10 MG tablet Take 10 mg by mouth daily as needed for allergies.   Yes [provider]  losartan (COZAAR) 25 MG tablet Take 25 mg by mouth daily.   Yes [provider]  metoprolol succinate (TOPROL-XL) 25 MG 24 hr tablet TAKE 1 TABLET BY MOUTH EVERY DAY 12/07/18  Yes Herminio Commons, MD  mirtazapine (REMERON) 7.5 MG tablet Take 7.5 mg by mouth at bedtime.   Yes [provider]  nitroGLYCERIN (NITROSTAT) 0.4 MG SL tablet DISSOLVE 1 TABLET UNDER TONGUE EVERY 5 MINUTES AS DIRECTED AS NEEDED FOR CHEST PAIN Patient taking differently: Place 0.4 mg under the tongue every 5 (five) minutes as needed for chest pain.  12/15/16  Yes Herminio Commons, MD  sertraline (ZOLOFT) 25 MG tablet Take 25 mg by mouth daily.   Yes [provider]  tetrahydrozoline-zinc (VISINE-AC) 0.05-0.25 % ophthalmic solution 2 drops as needed.   Yes [provider]  vitamin B-12 (CYANOCOBALAMIN) 1000 MCG tablet Take 1,000 mcg by mouth every other day.   Yes [provider]    Physical Exam: BP (!) 105/43   Pulse 66   Temp (!) 96.8 F (36 C) (Temporal)   Resp 15   Ht 5\' 2"  (1.575 m)   Wt 40.8 kg   SpO2 100%   BMI 16.46 kg/m   . General: Elderly female. Awake and alert and oriented x3. No acute cardiopulmonary distress.  Marland Kitchen HEENT: Normocephalic atraumatic.  Right and left ears normal in appearance.  Pupils equal, round, reactive to light. Extraocular muscles are intact. Sclerae anicteric and noninjected.  Moist mucosal membranes. No mucosal lesions.  . Neck: Neck supple without lymphadenopathy. No carotid bruits. No masses palpated.  . Cardiovascular: Regular rate with normal S1-S2 sounds. No murmurs, rubs, gallops auscultated. No JVD.  Marland Kitchen Respiratory: Good respiratory effort with no wheezes, rales, rhonchi. Lungs clear to auscultation bilaterally.  No accessory muscle use. . Abdomen: Soft, nontender, nondistended. Active bowel sounds. No masses or hepatosplenomegaly  . Skin: No rashes, lesions, or ulcerations.  Dry, warm to touch. 2+ dorsalis  pedis and radial pulses. . Musculoskeletal: No calf or leg pain. All major joints not erythematous nontender.  No upper or lower joint deformation.  Good ROM.  No contractures  . Psychiatric: Intact judgment and insight. Pleasant and cooperative. . Neurologic: No focal neurological deficits. Strength is 5/5 and symmetric in upper and lower extremities.  Cranial nerves II through XII are grossly intact.           Labs on Admission: I have personally reviewed following labs and imaging studies  CBC: Recent Labs  Lab 11/17/19 1447  WBC 18.8*  HGB 3.4*  HCT 13.2*  MCV 77.2*  PLT 0000000   Basic Metabolic Panel: Recent Labs  Lab 11/17/19 1447  NA 143  K 3.4*  CL 111  CO2 18*  GLUCOSE 154*  BUN 91*  CREATININE 2.03*  CALCIUM 7.8*   GFR: Estimated  Creatinine Clearance: 11.6 mL/min (A) (by C-G formula based on SCr of 2.03 mg/dL (H)). Liver Function Tests: Recent Labs  Lab 11/17/19 1447  AST 44*  ALT 62*  ALKPHOS 64  BILITOT 0.8  PROT 4.9*  ALBUMIN 2.4*   No results for input(s): LIPASE, AMYLASE in the last 168 hours. No results for input(s): AMMONIA in the last 168 hours. Coagulation Profile: Recent Labs  Lab 11/17/19 1447  INR 2.2*   Cardiac Enzymes: No results for input(s): CKTOTAL, CKMB, CKMBINDEX, TROPONINI in the last 168 hours. BNP (last 3 results) No results for input(s): PROBNP in the last 8760 hours. HbA1C: No results for input(s): HGBA1C in the last 72 hours. CBG: No results for input(s): GLUCAP in the last 168 hours. Lipid Profile: No results for input(s): CHOL, HDL, LDLCALC, TRIG, CHOLHDL, LDLDIRECT in the last 72 hours. Thyroid Function Tests: No results for input(s): TSH, T4TOTAL, FREET4, T3FREE, THYROIDAB in the last 72 hours. Anemia Panel: No results for input(s): VITAMINB12, FOLATE, FERRITIN, TIBC, IRON, RETICCTPCT in the last 72 hours. Urine analysis:    Component Value Date/Time   COLORURINE YELLOW 04/26/2013 1236   APPEARANCEUR CLEAR  04/26/2013 1236   LABSPEC 1.008 04/26/2013 1236   PHURINE 7.5 04/26/2013 1236   GLUCOSEU NEGATIVE 04/26/2013 1236   HGBUR NEGATIVE 04/26/2013 1236   BILIRUBINUR NEGATIVE 04/26/2013 1236   KETONESUR NEGATIVE 04/26/2013 1236   PROTEINUR NEGATIVE 04/26/2013 1236   UROBILINOGEN 0.2 04/26/2013 1236   NITRITE NEGATIVE 04/26/2013 1236   LEUKOCYTESUR TRACE (A) 04/26/2013 1236   Sepsis Labs: @LABRCNTIP (procalcitonin:4,lacticidven:4) ) Recent Results (from the past 240 hour(s))  Blood Culture (routine x 2)     Status: None (Preliminary result)   Collection Time: 11/17/19  3:30 PM   Specimen: Blood  Result Value Ref Range Status   Specimen Description BLOOD RIGHT WRIST  Final   Special Requests   Final    BOTTLES DRAWN AEROBIC AND ANAEROBIC Blood Culture results may not be optimal due to an inadequate volume of blood received in culture bottles Performed at [facility], [address] [place], [place] [address]    Culture PENDING  Incomplete   Report Status PENDING  Incomplete  Respiratory Panel by RT PCR (Flu A&B, Covid) - Nasopharyngeal Swab     Status: None   Collection Time: 11/17/19  8:01 PM   Specimen: Nasopharyngeal Swab  Result Value Ref Range Status   SARS Coronavirus 2 by RT PCR NEGATIVE NEGATIVE Final    Comment: (NOTE) SARS-CoV-2 target nucleic acids are NOT DETECTED. The SARS-CoV-2 RNA is generally detectable in upper respiratoy specimens during the acute phase of infection. The lowest concentration of SARS-CoV-2 viral copies this assay can detect is 131 copies/mL. A negative result does not preclude SARS-Cov-2 infection and should not be used as the sole basis for treatment or other patient management decisions. A negative result may occur with  improper specimen collection/handling, submission of specimen other than nasopharyngeal swab, presence of viral mutation(s) within the areas targeted by this assay, and inadequate number of viral copies (<131 copies/mL). A  negative result must be combined with clinical observations, patient history, and epidemiological information. The expected result is Negative. Fact Sheet for Patients:  PinkCheek.be Fact Sheet for Healthcare Providers:  GravelBags.it This test is not yet ap proved or cleared by the [place] FDA and  has been authorized for detection and/or diagnosis of SARS-CoV-2 by FDA under an Emergency Use Authorization (EUA). This EUA will remain  in effect (meaning this test  can be used) for the duration of the COVID-19 declaration under Section 564(b)(1) of the Act, 21 U.S.C. section 360bbb-3(b)(1), unless the authorization is terminated or revoked sooner.    Influenza A by PCR NEGATIVE NEGATIVE Final   Influenza B by PCR NEGATIVE NEGATIVE Final    Comment: (NOTE) The Xpert Xpress SARS-CoV-2/FLU/RSV assay is intended as an aid in  the diagnosis of influenza from Nasopharyngeal swab specimens and  should not be used as a sole basis for treatment. Nasal washings and  aspirates are unacceptable for Xpert Xpress SARS-CoV-2/FLU/RSV  testing. Fact Sheet for Patients: PinkCheek.be Fact Sheet for Healthcare Providers: GravelBags.it This test is not yet approved or cleared by the [place] FDA and  has been authorized for detection and/or diagnosis of SARS-CoV-2 by  FDA under an Emergency Use Authorization (EUA). This EUA will remain  in effect (meaning this test can be used) for the duration of the  Covid-19 declaration under Section 564(b)(1) of the Act, 21  U.S.C. section 360bbb-3(b)(1), unless the authorization is  terminated or revoked. Performed at [facility], [address], [place], [place] [address]      Radiological Exams on Admission: CT HEAD WO CONTRAST  Result Date: 11/17/2019 CLINICAL DATA:  Unwitnessed fall. Found down. History of dementia and stroke. EXAM:  CT HEAD WITHOUT CONTRAST CT CERVICAL SPINE WITHOUT CONTRAST TECHNIQUE: Multidetector CT imaging of the head and cervical spine was performed following the standard protocol without intravenous contrast. Multiplanar CT image reconstructions of the cervical spine were also generated. COMPARISON:  CT head 07/08/2019. FINDINGS: CT HEAD FINDINGS Brain: There is no evidence of acute intracranial hemorrhage, mass lesion, brain edema or extra-axial fluid collection. Stable mild atrophy with mild prominence of the ventricles and subarachnoid spaces for age. Mild chronic small vessel ischemic changes in the periventricular white matter. There is no CT evidence of acute cortical infarction. Vascular: Prominent intracranial vascular calcifications. No hyperdense vessel identified. Skull: Negative for fracture or focal lesion. Sinuses/Orbits: Chronic mucosal thickening in the sphenoid sinus. The additional paranasal sinuses, mastoid air cells and middle ears are clear. No significant orbital findings. Other: None. CT CERVICAL SPINE FINDINGS Alignment: There is a degenerative anterolisthesis at the C2-3, C3-4 and C7-T1 levels. Skull base and vertebrae: No evidence of acute fracture or traumatic subluxation. Soft tissues and spinal canal: No prevertebral fluid or swelling. No visible canal hematoma. Disc levels: There is multilevel cervical spondylosis, most advanced at the C4-5, C5-6 and C6-7 levels where there are posterior osteophytes contributing to mild foraminal narrowing bilaterally. Asymmetric facet hypertrophy is noted on the left at C3-4, contributing to moderate left foraminal narrowing. Upper chest: Biapical pleuroparenchymal scarring. Right thyroid nodularity is noted, measuring less than 1.5 cm in diameter, unlikely to be clinically significant given the patient's age. No followup imaging recommended (ref: J Am Coll Radiol. 2015 Feb;12(2): 143-50). Other: None. IMPRESSION: 1. No acute intracranial or calvarial  findings. Stable mild atrophy and chronic small vessel ischemic changes. 2. No evidence of acute cervical spine fracture, traumatic subluxation or static signs of instability. 3. Multilevel cervical spondylosis as described. Electronically Signed   By: Richardean Sale M.D.   On: 11/17/2019 16:30   CT CHEST W CONTRAST  Result Date: 11/17/2019 CLINICAL DATA:  86 year old female with history of abdominal trauma from an unwitnessed fall. EXAM: CT CHEST, ABDOMEN, AND PELVIS WITH CONTRAST TECHNIQUE: Multidetector CT imaging of the chest, abdomen and pelvis was performed following the standard protocol during bolus administration of intravenous contrast. CONTRAST:  85mL  OMNIPAQUE IOHEXOL 300 MG/ML  SOLN COMPARISON:  No priors. FINDINGS: CT CHEST FINDINGS Cardiovascular: Heart size is mildly enlarged. There is no significant pericardial fluid, thickening or pericardial calcification. There is aortic atherosclerosis, as well as atherosclerosis of the great vessels of the mediastinum and the coronary arteries, including calcified atherosclerotic plaque in the left main, left anterior descending, left circumflex and right coronary arteries. Status post median sternotomy for CABG including LIMA to the LAD. Mediastinum/Nodes: No posttraumatic hematoma identified in the mediastinum. No pathologically enlarged mediastinal or hilar lymph nodes. Esophagus is unremarkable in appearance. No axillary lymphadenopathy. Lungs/Pleura: Small bilateral pleural effusions (right greater than left). Mild ground-glass attenuation and interlobular septal thickening in the lungs bilaterally, suggesting a background of mild interstitial pulmonary edema. 8 x 4 mm (mean diameter 6 mm) pulmonary nodule in the superior segment of the left lower lobe (axial image 55 of series 4). No other larger more suspicious appearing pulmonary nodules or masses are noted. Musculoskeletal: No acute displaced fractures or aggressive appearing lytic or blastic  lesions are noted in the visualized portions of the skeleton. Status post median sternotomy. CT ABDOMEN PELVIS FINDINGS Hepatobiliary: No signs of significant acute traumatic injury to the liver. No suspicious cystic or solid hepatic lesions. Status post cholecystectomy. No intrahepatic biliary ductal dilatation. Common bile duct is dilated measuring 8 mm in the porta hepatis, within normal limits for this post cholecystectomy patient. Pancreas: No signs of significant acute traumatic injury to the pancreas. No pancreatic mass. No pancreatic ductal dilatation. No pancreatic or peripancreatic fluid collections or inflammatory changes. Spleen: No signs of acute traumatic injury to the spleen. Adrenals/Urinary Tract: No signs of acute traumatic injury to either kidney or adrenal gland. Severe atrophy of the left kidney. Right kidney is normal in appearance. Thickening of the adrenal glands bilaterally, likely reflective of adrenal hyperplasia. No hydroureteronephrosis. Urinary bladder is nearly completely decompressed, but appears intact and is otherwise unremarkable in appearance. Stomach/Bowel: No evidence of significant acute traumatic injury to the hollow viscera. Normal appearance of the stomach. No pathologic dilatation of small bowel or colon. Large amount of stool in the rectal vault which may indicate constipation. A few scattered colonic diverticulae are noted, most evident in the sigmoid colon, without surrounding inflammatory changes to suggest an acute diverticulitis at this time. The appendix is not confidently identified and may be surgically absent. Regardless, there are no inflammatory changes noted adjacent to the cecum to suggest the presence of an acute appendicitis at this time. Vascular/Lymphatic: No evidence of significant acute traumatic injury to the abdominal aorta or the major arteries or veins of the abdomen or pelvis. Extensive aortic atherosclerosis with fusiform ectasia of the infrarenal  abdominal aorta which measures up to 2.3 x 2.0 cm. No lymphadenopathy noted in the abdomen or pelvis. Reproductive: Status post hysterectomy. Ovaries are not confidently identified may be surgically absent or atrophic. Other: No high attenuation fluid collection within the peritoneal cavity or retroperitoneum to suggest significant posttraumatic hemorrhage. No significant volume of ascites. No pneumoperitoneum. Musculoskeletal: There are no acute displaced fractures or aggressive appearing lytic or blastic lesions noted in the visualized portions of the skeleton. IMPRESSION: 1. No evidence of significant acute traumatic injury to the chest, abdomen or pelvis. 2. Cardiomegaly with small bilateral pleural effusions and probable interstitial pulmonary edema; imaging findings suggestive of congestive heart failure. 3. Aortic atherosclerosis, in addition to left main and 3 vessel coronary artery disease. There is also fusiform ectasia of the infrarenal abdominal aorta which measures  up to 2.3 x 2.0 cm in diameter. 4. Small pulmonary nodule with a mean diameter of 6 mm in the superior segment of the left lower lobe. This is nonspecific, but is subpleural in location and statistically likely a subpleural lymph node. Repeat noncontrast chest CT could be considered in 1 year to re-evaluate this finding, depending on the patient's clinical situation (this is considered optional given the patient's advanced age and high likelihood of a benign lesion). 5. Large volume of stool in the rectal vault which may suggest constipation or fecal impaction. 6. Mild colonic diverticulosis without evidence of acute diverticulitis at this time. 7. Additional incidental findings, as above. Electronically Signed   By: Vinnie Langton M.D.   On: 11/17/2019 16:34   CT CERVICAL SPINE WO CONTRAST  Result Date: 11/17/2019 CLINICAL DATA:  Unwitnessed fall. Found down. History of dementia and stroke. EXAM: CT HEAD WITHOUT CONTRAST CT CERVICAL  SPINE WITHOUT CONTRAST TECHNIQUE: Multidetector CT imaging of the head and cervical spine was performed following the standard protocol without intravenous contrast. Multiplanar CT image reconstructions of the cervical spine were also generated. COMPARISON:  CT head 07/08/2019. FINDINGS: CT HEAD FINDINGS Brain: There is no evidence of acute intracranial hemorrhage, mass lesion, brain edema or extra-axial fluid collection. Stable mild atrophy with mild prominence of the ventricles and subarachnoid spaces for age. Mild chronic small vessel ischemic changes in the periventricular white matter. There is no CT evidence of acute cortical infarction. Vascular: Prominent intracranial vascular calcifications. No hyperdense vessel identified. Skull: Negative for fracture or focal lesion. Sinuses/Orbits: Chronic mucosal thickening in the sphenoid sinus. The additional paranasal sinuses, mastoid air cells and middle ears are clear. No significant orbital findings. Other: None. CT CERVICAL SPINE FINDINGS Alignment: There is a degenerative anterolisthesis at the C2-3, C3-4 and C7-T1 levels. Skull base and vertebrae: No evidence of acute fracture or traumatic subluxation. Soft tissues and spinal canal: No prevertebral fluid or swelling. No visible canal hematoma. Disc levels: There is multilevel cervical spondylosis, most advanced at the C4-5, C5-6 and C6-7 levels where there are posterior osteophytes contributing to mild foraminal narrowing bilaterally. Asymmetric facet hypertrophy is noted on the left at C3-4, contributing to moderate left foraminal narrowing. Upper chest: Biapical pleuroparenchymal scarring. Right thyroid nodularity is noted, measuring less than 1.5 cm in diameter, unlikely to be clinically significant given the patient's age. No followup imaging recommended (ref: J Am Coll Radiol. 2015 Feb;12(2): 143-50). Other: None. IMPRESSION: 1. No acute intracranial or calvarial findings. Stable mild atrophy and chronic  small vessel ischemic changes. 2. No evidence of acute cervical spine fracture, traumatic subluxation or static signs of instability. 3. Multilevel cervical spondylosis as described. Electronically Signed   By: Richardean Sale M.D.   On: 11/17/2019 16:30   CT ABDOMEN PELVIS W CONTRAST  Result Date: 11/17/2019 CLINICAL DATA:  86 year old female with history of abdominal trauma from an unwitnessed fall. EXAM: CT CHEST, ABDOMEN, AND PELVIS WITH CONTRAST TECHNIQUE: Multidetector CT imaging of the chest, abdomen and pelvis was performed following the standard protocol during bolus administration of intravenous contrast. CONTRAST:  20mL OMNIPAQUE IOHEXOL 300 MG/ML  SOLN COMPARISON:  No priors. FINDINGS: CT CHEST FINDINGS Cardiovascular: Heart size is mildly enlarged. There is no significant pericardial fluid, thickening or pericardial calcification. There is aortic atherosclerosis, as well as atherosclerosis of the great vessels of the mediastinum and the coronary arteries, including calcified atherosclerotic plaque in the left main, left anterior descending, left circumflex and right coronary arteries. Status post median sternotomy  for CABG including LIMA to the LAD. Mediastinum/Nodes: No posttraumatic hematoma identified in the mediastinum. No pathologically enlarged mediastinal or hilar lymph nodes. Esophagus is unremarkable in appearance. No axillary lymphadenopathy. Lungs/Pleura: Small bilateral pleural effusions (right greater than left). Mild ground-glass attenuation and interlobular septal thickening in the lungs bilaterally, suggesting a background of mild interstitial pulmonary edema. 8 x 4 mm (mean diameter 6 mm) pulmonary nodule in the superior segment of the left lower lobe (axial image 55 of series 4). No other larger more suspicious appearing pulmonary nodules or masses are noted. Musculoskeletal: No acute displaced fractures or aggressive appearing lytic or blastic lesions are noted in the visualized  portions of the skeleton. Status post median sternotomy. CT ABDOMEN PELVIS FINDINGS Hepatobiliary: No signs of significant acute traumatic injury to the liver. No suspicious cystic or solid hepatic lesions. Status post cholecystectomy. No intrahepatic biliary ductal dilatation. Common bile duct is dilated measuring 8 mm in the porta hepatis, within normal limits for this post cholecystectomy patient. Pancreas: No signs of significant acute traumatic injury to the pancreas. No pancreatic mass. No pancreatic ductal dilatation. No pancreatic or peripancreatic fluid collections or inflammatory changes. Spleen: No signs of acute traumatic injury to the spleen. Adrenals/Urinary Tract: No signs of acute traumatic injury to either kidney or adrenal gland. Severe atrophy of the left kidney. Right kidney is normal in appearance. Thickening of the adrenal glands bilaterally, likely reflective of adrenal hyperplasia. No hydroureteronephrosis. Urinary bladder is nearly completely decompressed, but appears intact and is otherwise unremarkable in appearance. Stomach/Bowel: No evidence of significant acute traumatic injury to the hollow viscera. Normal appearance of the stomach. No pathologic dilatation of small bowel or colon. Large amount of stool in the rectal vault which may indicate constipation. A few scattered colonic diverticulae are noted, most evident in the sigmoid colon, without surrounding inflammatory changes to suggest an acute diverticulitis at this time. The appendix is not confidently identified and may be surgically absent. Regardless, there are no inflammatory changes noted adjacent to the cecum to suggest the presence of an acute appendicitis at this time. Vascular/Lymphatic: No evidence of significant acute traumatic injury to the abdominal aorta or the major arteries or veins of the abdomen or pelvis. Extensive aortic atherosclerosis with fusiform ectasia of the infrarenal abdominal aorta which measures up to  2.3 x 2.0 cm. No lymphadenopathy noted in the abdomen or pelvis. Reproductive: Status post hysterectomy. Ovaries are not confidently identified may be surgically absent or atrophic. Other: No high attenuation fluid collection within the peritoneal cavity or retroperitoneum to suggest significant posttraumatic hemorrhage. No significant volume of ascites. No pneumoperitoneum. Musculoskeletal: There are no acute displaced fractures or aggressive appearing lytic or blastic lesions noted in the visualized portions of the skeleton. IMPRESSION: 1. No evidence of significant acute traumatic injury to the chest, abdomen or pelvis. 2. Cardiomegaly with small bilateral pleural effusions and probable interstitial pulmonary edema; imaging findings suggestive of congestive heart failure. 3. Aortic atherosclerosis, in addition to left main and 3 vessel coronary artery disease. There is also fusiform ectasia of the infrarenal abdominal aorta which measures up to 2.3 x 2.0 cm in diameter. 4. Small pulmonary nodule with a mean diameter of 6 mm in the superior segment of the left lower lobe. This is nonspecific, but is subpleural in location and statistically likely a subpleural lymph node. Repeat noncontrast chest CT could be considered in 1 year to re-evaluate this finding, depending on the patient's clinical situation (this is considered optional given the patient's  advanced age and high likelihood of a benign lesion). 5. Large volume of stool in the rectal vault which may suggest constipation or fecal impaction. 6. Mild colonic diverticulosis without evidence of acute diverticulitis at this time. 7. Additional incidental findings, as above. Electronically Signed   By: Vinnie Langton M.D.   On: 11/17/2019 16:34   DG Pelvis Portable  Result Date: 11/17/2019 CLINICAL DATA:  Un witnessed fall. Found on the floor. Complaining of left arm and bilateral leg pain. Patient has dementia. EXAM: PORTABLE PELVIS 1-2 VIEWS COMPARISON:   None. FINDINGS: No convincing fracture.  No bone lesion. Hip joints, SI joints and symphysis pubis are normally spaced and aligned. Skeletal structures are demineralized. Soft tissues are unremarkable. Visualized colon shows increased colonic stool burden. IMPRESSION: No fracture or dislocation Electronically Signed   By: Lajean Manes M.D.   On: 11/17/2019 14:01   DG Chest Port 1 View  Result Date: 11/17/2019 CLINICAL DATA:  unwitnessed fall. She was found in the floor "halfway under the bed." Patient complaining of pain to left arm and bilateral legs but patient has dementia. Also complaining of back pain. Htn/chf/ex smoker/hx cabg EXAM: PORTABLE CHEST 1 VIEW COMPARISON:  07/11/2019. FINDINGS: Hazy bilateral airspace opacities are noted, similar to the prior radiographs. Pattern is concerning for multifocal pneumonia. The opacities are new from a chest radiograph from 05/17/2016. There changes from cardiac surgery. Right coronary artery stent is noted. Cardiac silhouette top-normal in size to slightly enlarged. No mediastinal or hilar masses. No convincing pleural effusion.  No pneumothorax. Skeletal structures are grossly intact. IMPRESSION: 1. Hazy bilateral airspace opacities are noted in a pattern suspicious for multifocal pneumonia. However, the appearance is similar to the most recent prior chest radiographs. Findings could potentially be due to asymmetric pulmonary edema instead of infection. 2. 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 Electronically Signed   By: Lajean Manes M.D.   On: 11/17/2019 14:00    EKG: Independently reviewed.  Sinus rhythm.  LVH.  No acute ST changes.  Assessment/Plan: Principal Problem:   Sepsis ([place]) Active Problems:   Paroxysmal atrial fibrillation ([facility])   Cardiomyopathy, ischemic   Acute renal failure ([facility])   Hypothyroidism due to amiodarone   Acute gastrointestinal bleeding   Lactic acidosis   Dementia without behavioral disturbance ([facility])   Acute blood loss  anemia   Acute metabolic encephalopathy    This patient was discussed with the ED physician, including pertinent vitals, physical exam findings, labs, and imaging.  We also discussed care given by the ED provider.  1. Sepsis a. Broad-spectrum antibiotics b. Admit to stepdown unit c. Blood cultures pending d. Urine culture pending e. Repeat CBC in the morning f. Procalcitonin 2. Acute blood loss anemia a. GI consulted b. 3 units of blood transfusing c. Check H&H tomorrow d. Protonix 3. Lactic acidosis a. Repeat lactic acid pending 4. GI bleed a. GI consulted 5. Hypothyroidism a. Continue Synthroid 6. Acute renal failure a. Recheck creatinine b. Likely secondary to anemia and volume depletion 7. Cardiomyopathy a.  8. Atrial fibrillation a. Continue amiodarone and Cardizem  DVT prophylaxis: SCDs Consultants: GI Code Status: DNR Family Communication: none  Disposition Plan: pending   Truett Mainland, DO

## 2019-11-17 NOTE — ED Notes (Signed)
Date and time results received: 11/17/19 1523 (use smartphrase ".now" to insert current time)  Test: hemoglobin Critical Value: 3.4  Name of Provider Notified: sammy pa  Orders Received? Or Actions Taken?: see chart

## 2019-11-17 NOTE — ED Provider Notes (Signed)
[name] Provider Note   CSN: [id number] Arrival date & time: 11/17/19  1201     History Chief Complaint  Patient presents with  . Fall    [name] is a 86 y.o. female with a history of hypothyroidism, CAD, hypertension, hyperlipidemia, prior stroke, CHF, chronic respiratory failure on 2L via [place] @ baseline and PAF on Eliquis who presents to the ED from [facility] for evaluation s/p fall.   Called & spoke with clinical staff @ [facility] who state that patient was in bed when her roommate witnessed her fall to the ground. Unsure of head injury/LOC. She was only on the floor a few minutes as her roommate alerted staff immediately. She is at her mental status baseline with her dementia- minimally verbal, not comprehensibly conversive. They do not report her complaining of any specific areas of pain. They states she has otherwise been in her usual state of health.  HPI     Past Medical History:  Diagnosis Date  . Anemia   . Anxiety   . Chronic anxiety   . Coronary artery disease    remote CABG in 1989 and redo in 2007; has residual stable angina  . Depression   . High risk medication use    on amiodarone  . Hyperlipidemia   . Hypertension   . Hypothyroidism 05/29/2012  . Macular degeneration   . PAF (paroxysmal atrial fibrillation) ([place])   . Stroke ([place])   . Systolic and diastolic CHF, chronic (HCC)    EF 45% per echo in 2012    Patient Active Problem List   Diagnosis Date Noted  . Insomnia 11/27/2013  . Dermatitis 07/06/2012  . 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 Hypothyroidism 05/29/2012  . Transaminitis 05/29/2012  . Acute bronchitis 05/28/2012  . Bradycardia 05/28/2012  . Acute renal failure ([place]) 05/28/2012  . Chest pain 05/28/2012  . Thrombocytopenia ([place]) 05/28/2012  . Cardiomyopathy, ischemic 12/18/2011  . Acute on chronic combined systolic and diastolic congestive heart failure ([place]) 12/18/2011  . Acute  hypokalemia 12/18/2011  . Paroxysmal atrial fibrillation ([place]) 02/02/2011  . Ischemic heart disease 02/02/2011  . Failed CABG (coronary artery bypass graft) 02/02/2011  . Hypercholesterolemia 02/02/2011  . Anxiety 02/02/2011  . Depression 02/02/2011  . Anemia 02/02/2011    Past Surgical History:  Procedure Laterality Date  . ABDOMINAL HYSTERECTOMY    . CARDIAC SURGERY    . CORONARY ARTERY BYPASS GRAFT     1989 with redo 2007 1. Redo median sternotomy, extracorporeal circulation, redo coronary     OB History   No obstetric history on file.     Family History  Problem Relation Age of Onset  . Heart disease Mother   . Aneurysm Mother     Social History   Tobacco Use  . Smoking status: Former Smoker    Packs/day: 0.25    Years: 2.00    Pack years: 0.50    Types: Cigarettes    Start date: 12/12/1945    Quit date: 12/13/1947    Years since quitting: 71.9  . Smokeless tobacco: Never Used  Substance Use Topics  . Alcohol use: No    Alcohol/week: 0.0 standard drinks  . Drug use: No    Home Medications Prior to Admission medications   Medication Sig Start Date End Date Taking? Authorizing Provider  acetaminophen (TYLENOL) 325 MG tablet Take 650 mg by mouth as needed.    [provider]  ALPRAZolam Duanne Moron) 0.5 MG  tablet Take 0.5 mg by mouth 2 (two) times daily as needed for anxiety or sleep.    [provider]  amiodarone (PACERONE) 200 MG tablet TAKE 0.5 TABLETS (100 MG TOTAL) BY MOUTH DAILY. 12/07/18   Herminio Commons, MD  anastrozole (ARIMIDEX) 1 MG tablet Take 1 mg by mouth daily.    [provider]  aspirin EC 81 MG tablet Take 81 mg by mouth daily.    [provider]  Biotin 5000 MCG TABS Take 1 tablet by mouth daily.    [provider]  Calcium Carbonate-Vit D-Min (CALTRATE 600+D PLUS PO) Take 1 tablet by mouth 2 (two) times daily.    [provider]  Cholecalciferol (VITAMIN D3) 1000 units CAPS Take 1 capsule  by mouth daily.    [provider]  famotidine (PEPCID) 40 MG tablet Take 40 mg by mouth 2 (two) times daily. 11/17/18   [provider]  Ferrous Gluconate (IRON 27 PO) Take 1 tablet by mouth daily.    [provider]  furosemide (LASIX) 40 MG tablet TAKE 1 TABLET BY MOUTH EVERY DAY 05/17/19   Herminio Commons, MD  Hypromellose (ARTIFICIAL TEARS OP) Apply 2 drops to eye as needed.    [provider]  KLOR-CON M20 20 MEQ tablet TAKE 1 TABLET BY MOUTH EVERY DAY 12/07/18   Herminio Commons, MD  levothyroxine (SYNTHROID, LEVOTHROID) 50 MCG tablet Take 50 mcg by mouth daily. 11/17/18   [provider]  loratadine (CLARITIN) 10 MG tablet Take 10 mg by mouth daily as needed for allergies.    [provider]  losartan (COZAAR) 25 MG tablet Take 25 mg by mouth daily.    [provider]  metoprolol succinate (TOPROL-XL) 25 MG 24 hr tablet TAKE 1 TABLET BY MOUTH EVERY DAY 12/07/18   Herminio Commons, MD  mirtazapine (REMERON) 7.5 MG tablet Take 7.5 mg by mouth at bedtime.    [provider]  nitroGLYCERIN (NITROSTAT) 0.4 MG SL tablet DISSOLVE 1 TABLET UNDER TONGUE EVERY 5 MINUTES AS DIRECTED AS NEEDED FOR CHEST PAIN 12/15/16   Herminio Commons, MD  sertraline (ZOLOFT) 25 MG tablet Take 25 mg by mouth daily.    [provider]  tetrahydrozoline-zinc (VISINE-AC) 0.05-0.25 % ophthalmic solution 2 drops as needed.    [provider]  vitamin B-12 (CYANOCOBALAMIN) 1000 MCG tablet Take 1,000 mcg by mouth every other day.    [provider]    Allergies    Patient has no known allergies.  Review of Systems   Review of Systems  Unable to perform ROS: Dementia    Physical Exam Updated Vital Signs BP (!) 89/35 (BP Location: Left Arm)   Pulse 62   Temp (!) 97.5 F (36.4 C) (Axillary)   Ht 5\' 2"  (1.575 m)   Wt 40.8 kg   SpO2 92%   BMI 16.46 kg/m   Physical Exam Vitals and nursing note reviewed.  Exam conducted with a chaperone present.  Constitutional:      Appearance: She is cachectic. She is ill-appearing.  HENT:     Head: Normocephalic and atraumatic. No raccoon eyes or Battle's sign.     Right Ear: No hemotympanum.     Left Ear: No hemotympanum.     Nose: Nose normal.  Eyes:     Comments: Holding eyes closed primarily. PERRL. Conjunctival pallor noted.   Cardiovascular:     Rate and Rhythm: Normal rate and regular rhythm.  Pulmonary:  Effort: Tachypnea present.     Breath sounds: No wheezing, rhonchi or rales.  Chest:     Chest wall: No tenderness.     Comments: No chest ecchymosis/abrasions.  Abdominal:     General: There is no distension.     Palpations: Abdomen is soft.     Tenderness: There is generalized abdominal tenderness.     Comments: No abdominal ecchymosis/abrasions.   Genitourinary:    Comments: While obtaining rectal temp with nursing staff noted to have dark stool- fecal occult positive.  Musculoskeletal:     Cervical back: Neck supple. No spinous process tenderness.     Comments: Moving all extremities some without obvious focal tenderness. No focal midline spinal tenderness.   Skin:    Findings: No rash.     Comments: Pale appearing.   Neurological:     Mental Status: She is alert.     Comments: Incomprehensible speech/moans. Unable to follow commands.      ED Results / Procedures / Treatments   Labs (all labs ordered are listed, but only abnormal results are displayed) Labs Reviewed  COMPREHENSIVE METABOLIC PANEL - Abnormal; Notable for the following components:      Result Value   Potassium 3.4 (*)    CO2 18 (*)    Glucose, Bld 154 (*)    BUN 91 (*)    Creatinine, Ser 2.03 (*)    Calcium 7.8 (*)    Total Protein 4.9 (*)    Albumin 2.4 (*)    AST 44 (*)    ALT 62 (*)    GFR calc non Af Amer 21 (*)    GFR calc Af Amer 24 (*)    All other components within normal limits  CBC - Abnormal; Notable for the following components:    WBC 18.8 (*)    RBC 1.71 (*)    Hemoglobin 3.4 (*)    HCT 13.2 (*)    MCV 77.2 (*)    MCH 19.9 (*)    MCHC 25.8 (*)    RDW 19.7 (*)    nRBC 1.8 (*)    All other components within normal limits  PROTIME-INR - Abnormal; Notable for the following components:   Prothrombin Time 24.0 (*)    INR 2.2 (*)    All other components within normal limits  LACTIC ACID, PLASMA - Abnormal; Notable for the following components:   Lactic Acid, Venous 6.4 (*)    All other components within normal limits  POC OCCULT BLOOD, ED - Abnormal; Notable for the following components:   Fecal Occult Bld POSITIVE (*)    All other components within normal limits  CULTURE, BLOOD (ROUTINE X 2)  CULTURE, BLOOD (ROUTINE X 2)  URINE CULTURE  RESPIRATORY PANEL BY RT PCR (FLU A&B, COVID)  APTT  URINALYSIS, ROUTINE W REFLEX MICROSCOPIC  LACTIC ACID, PLASMA  LACTIC ACID, PLASMA  BASIC METABOLIC PANEL  I-STAT CHEM 8, ED  TYPE AND SCREEN  PREPARE RBC (CROSSMATCH)  PREPARE RBC (CROSSMATCH)  ABO/RH    EKG None  Radiology CT HEAD WO CONTRAST  Result Date: 11/17/2019 CLINICAL DATA:  Unwitnessed fall. Found down. History of dementia and stroke. EXAM: CT HEAD WITHOUT CONTRAST CT CERVICAL SPINE WITHOUT CONTRAST TECHNIQUE: Multidetector CT imaging of the head and cervical spine was performed following the standard protocol without intravenous contrast. Multiplanar CT image reconstructions of the cervical spine were also generated. COMPARISON:  CT head 07/08/2019. FINDINGS: CT HEAD FINDINGS Brain: There is no evidence of acute  intracranial hemorrhage, mass lesion, brain edema or extra-axial fluid collection. Stable mild atrophy with mild prominence of the ventricles and subarachnoid spaces for age. Mild chronic small vessel ischemic changes in the periventricular white matter. There is no CT evidence of acute cortical infarction. Vascular: Prominent intracranial vascular calcifications. No hyperdense vessel identified. Skull:  Negative for fracture or focal lesion. Sinuses/Orbits: Chronic mucosal thickening in the sphenoid sinus. The additional paranasal sinuses, mastoid air cells and middle ears are clear. No significant orbital findings. Other: None. CT CERVICAL SPINE FINDINGS Alignment: There is a degenerative anterolisthesis at the C2-3, C3-4 and C7-T1 levels. Skull base and vertebrae: No evidence of acute fracture or traumatic subluxation. Soft tissues and spinal canal: No prevertebral fluid or swelling. No visible canal hematoma. Disc levels: There is multilevel cervical spondylosis, most advanced at the C4-5, C5-6 and C6-7 levels where there are posterior osteophytes contributing to mild foraminal narrowing bilaterally. Asymmetric facet hypertrophy is noted on the left at C3-4, contributing to moderate left foraminal narrowing. Upper chest: Biapical pleuroparenchymal scarring. Right thyroid nodularity is noted, measuring less than 1.5 cm in diameter, unlikely to be clinically significant given the patient's age. No followup imaging recommended (ref: J Am Coll Radiol. 2015 Feb;12(2): 143-50). Other: None. IMPRESSION: 1. No acute intracranial or calvarial findings. Stable mild atrophy and chronic small vessel ischemic changes. 2. No evidence of acute cervical spine fracture, traumatic subluxation or static signs of instability. 3. Multilevel cervical spondylosis as described. Electronically Signed   By: Richardean Sale M.D.   On: 11/17/2019 16:30   CT CHEST W CONTRAST  Result Date: 11/17/2019 CLINICAL DATA:  86 year old female with history of abdominal trauma from an unwitnessed fall. EXAM: CT CHEST, ABDOMEN, AND PELVIS WITH CONTRAST TECHNIQUE: Multidetector CT imaging of the chest, abdomen and pelvis was performed following the standard protocol during bolus administration of intravenous contrast. CONTRAST:  62mL OMNIPAQUE IOHEXOL 300 MG/ML  SOLN COMPARISON:  No priors. FINDINGS: CT CHEST FINDINGS Cardiovascular: Heart size is  mildly enlarged. There is no significant pericardial fluid, thickening or pericardial calcification. There is aortic atherosclerosis, as well as atherosclerosis of the great vessels of the mediastinum and the coronary arteries, including calcified atherosclerotic plaque in the left main, left anterior descending, left circumflex and right coronary arteries. Status post median sternotomy for CABG including LIMA to the LAD. Mediastinum/Nodes: No posttraumatic hematoma identified in the mediastinum. No pathologically enlarged mediastinal or hilar lymph nodes. Esophagus is unremarkable in appearance. No axillary lymphadenopathy. Lungs/Pleura: Small bilateral pleural effusions (right greater than left). Mild ground-glass attenuation and interlobular septal thickening in the lungs bilaterally, suggesting a background of mild interstitial pulmonary edema. 8 x 4 mm (mean diameter 6 mm) pulmonary nodule in the superior segment of the left lower lobe (axial image 55 of series 4). No other larger more suspicious appearing pulmonary nodules or masses are noted. Musculoskeletal: No acute displaced fractures or aggressive appearing lytic or blastic lesions are noted in the visualized portions of the skeleton. Status post median sternotomy. CT ABDOMEN PELVIS FINDINGS Hepatobiliary: No signs of significant acute traumatic injury to the liver. No suspicious cystic or solid hepatic lesions. Status post cholecystectomy. No intrahepatic biliary ductal dilatation. Common bile duct is dilated measuring 8 mm in the porta hepatis, within normal limits for this post cholecystectomy patient. Pancreas: No signs of significant acute traumatic injury to the pancreas. No pancreatic mass. No pancreatic ductal dilatation. No pancreatic or peripancreatic fluid collections or inflammatory changes. Spleen: No signs of acute traumatic injury to the  spleen. Adrenals/Urinary Tract: No signs of acute traumatic injury to either kidney or adrenal gland.  Severe atrophy of the left kidney. Right kidney is normal in appearance. Thickening of the adrenal glands bilaterally, likely reflective of adrenal hyperplasia. No hydroureteronephrosis. Urinary bladder is nearly completely decompressed, but appears intact and is otherwise unremarkable in appearance. Stomach/Bowel: No evidence of significant acute traumatic injury to the hollow viscera. Normal appearance of the stomach. No pathologic dilatation of small bowel or colon. Large amount of stool in the rectal vault which may indicate constipation. A few scattered colonic diverticulae are noted, most evident in the sigmoid colon, without surrounding inflammatory changes to suggest an acute diverticulitis at this time. The appendix is not confidently identified and may be surgically absent. Regardless, there are no inflammatory changes noted adjacent to the cecum to suggest the presence of an acute appendicitis at this time. Vascular/Lymphatic: No evidence of significant acute traumatic injury to the abdominal aorta or the major arteries or veins of the abdomen or pelvis. Extensive aortic atherosclerosis with fusiform ectasia of the infrarenal abdominal aorta which measures up to 2.3 x 2.0 cm. No lymphadenopathy noted in the abdomen or pelvis. Reproductive: Status post hysterectomy. Ovaries are not confidently identified may be surgically absent or atrophic. Other: No high attenuation fluid collection within the peritoneal cavity or retroperitoneum to suggest significant posttraumatic hemorrhage. No significant volume of ascites. No pneumoperitoneum. Musculoskeletal: There are no acute displaced fractures or aggressive appearing lytic or blastic lesions noted in the visualized portions of the skeleton. IMPRESSION: 1. No evidence of significant acute traumatic injury to the chest, abdomen or pelvis. 2. Cardiomegaly with small bilateral pleural effusions and probable interstitial pulmonary edema; imaging findings suggestive  of congestive heart failure. 3. Aortic atherosclerosis, in addition to left main and 3 vessel coronary artery disease. There is also fusiform ectasia of the infrarenal abdominal aorta which measures up to 2.3 x 2.0 cm in diameter. 4. Small pulmonary nodule with a mean diameter of 6 mm in the superior segment of the left lower lobe. This is nonspecific, but is subpleural in location and statistically likely a subpleural lymph node. Repeat noncontrast chest CT could be considered in 1 year to re-evaluate this finding, depending on the patient's clinical situation (this is considered optional given the patient's advanced age and high likelihood of a benign lesion). 5. Large volume of stool in the rectal vault which may suggest constipation or fecal impaction. 6. Mild colonic diverticulosis without evidence of acute diverticulitis at this time. 7. Additional incidental findings, as above. Electronically Signed   By: Vinnie Langton M.D.   On: 11/17/2019 16:34   CT CERVICAL SPINE WO CONTRAST  Result Date: 11/17/2019 CLINICAL DATA:  Unwitnessed fall. Found down. History of dementia and stroke. EXAM: CT HEAD WITHOUT CONTRAST CT CERVICAL SPINE WITHOUT CONTRAST TECHNIQUE: Multidetector CT imaging of the head and cervical spine was performed following the standard protocol without intravenous contrast. Multiplanar CT image reconstructions of the cervical spine were also generated. COMPARISON:  CT head 07/08/2019. FINDINGS: CT HEAD FINDINGS Brain: There is no evidence of acute intracranial hemorrhage, mass lesion, brain edema or extra-axial fluid collection. Stable mild atrophy with mild prominence of the ventricles and subarachnoid spaces for age. Mild chronic small vessel ischemic changes in the periventricular white matter. There is no CT evidence of acute cortical infarction. Vascular: Prominent intracranial vascular calcifications. No hyperdense vessel identified. Skull: Negative for fracture or focal lesion.  Sinuses/Orbits: Chronic mucosal thickening in the sphenoid sinus. The additional  paranasal sinuses, mastoid air cells and middle ears are clear. No significant orbital findings. Other: None. CT CERVICAL SPINE FINDINGS Alignment: There is a degenerative anterolisthesis at the C2-3, C3-4 and C7-T1 levels. Skull base and vertebrae: No evidence of acute fracture or traumatic subluxation. Soft tissues and spinal canal: No prevertebral fluid or swelling. No visible canal hematoma. Disc levels: There is multilevel cervical spondylosis, most advanced at the C4-5, C5-6 and C6-7 levels where there are posterior osteophytes contributing to mild foraminal narrowing bilaterally. Asymmetric facet hypertrophy is noted on the left at C3-4, contributing to moderate left foraminal narrowing. Upper chest: Biapical pleuroparenchymal scarring. Right thyroid nodularity is noted, measuring less than 1.5 cm in diameter, unlikely to be clinically significant given the patient's age. No followup imaging recommended (ref: J Am Coll Radiol. 2015 Feb;12(2): 143-50). Other: None. IMPRESSION: 1. No acute intracranial or calvarial findings. Stable mild atrophy and chronic small vessel ischemic changes. 2. No evidence of acute cervical spine fracture, traumatic subluxation or static signs of instability. 3. Multilevel cervical spondylosis as described. Electronically Signed   By: Richardean Sale M.D.   On: 11/17/2019 16:30   CT ABDOMEN PELVIS W CONTRAST  Result Date: 11/17/2019 CLINICAL DATA:  86 year old female with history of abdominal trauma from an unwitnessed fall. EXAM: CT CHEST, ABDOMEN, AND PELVIS WITH CONTRAST TECHNIQUE: Multidetector CT imaging of the chest, abdomen and pelvis was performed following the standard protocol during bolus administration of intravenous contrast. CONTRAST:  7mL OMNIPAQUE IOHEXOL 300 MG/ML  SOLN COMPARISON:  No priors. FINDINGS: CT CHEST FINDINGS Cardiovascular: Heart size is mildly enlarged. There is no  significant pericardial fluid, thickening or pericardial calcification. There is aortic atherosclerosis, as well as atherosclerosis of the great vessels of the mediastinum and the coronary arteries, including calcified atherosclerotic plaque in the left main, left anterior descending, left circumflex and right coronary arteries. Status post median sternotomy for CABG including LIMA to the LAD. Mediastinum/Nodes: No posttraumatic hematoma identified in the mediastinum. No pathologically enlarged mediastinal or hilar lymph nodes. Esophagus is unremarkable in appearance. No axillary lymphadenopathy. Lungs/Pleura: Small bilateral pleural effusions (right greater than left). Mild ground-glass attenuation and interlobular septal thickening in the lungs bilaterally, suggesting a background of mild interstitial pulmonary edema. 8 x 4 mm (mean diameter 6 mm) pulmonary nodule in the superior segment of the left lower lobe (axial image 55 of series 4). No other larger more suspicious appearing pulmonary nodules or masses are noted. Musculoskeletal: No acute displaced fractures or aggressive appearing lytic or blastic lesions are noted in the visualized portions of the skeleton. Status post median sternotomy. CT ABDOMEN PELVIS FINDINGS Hepatobiliary: No signs of significant acute traumatic injury to the liver. No suspicious cystic or solid hepatic lesions. Status post cholecystectomy. No intrahepatic biliary ductal dilatation. Common bile duct is dilated measuring 8 mm in the porta hepatis, within normal limits for this post cholecystectomy patient. Pancreas: No signs of significant acute traumatic injury to the pancreas. No pancreatic mass. No pancreatic ductal dilatation. No pancreatic or peripancreatic fluid collections or inflammatory changes. Spleen: No signs of acute traumatic injury to the spleen. Adrenals/Urinary Tract: No signs of acute traumatic injury to either kidney or adrenal gland. Severe atrophy of the left  kidney. Right kidney is normal in appearance. Thickening of the adrenal glands bilaterally, likely reflective of adrenal hyperplasia. No hydroureteronephrosis. Urinary bladder is nearly completely decompressed, but appears intact and is otherwise unremarkable in appearance. Stomach/Bowel: No evidence of significant acute traumatic injury to the hollow viscera. Normal appearance  of the stomach. No pathologic dilatation of small bowel or colon. Large amount of stool in the rectal vault which may indicate constipation. A few scattered colonic diverticulae are noted, most evident in the sigmoid colon, without surrounding inflammatory changes to suggest an acute diverticulitis at this time. The appendix is not confidently identified and may be surgically absent. Regardless, there are no inflammatory changes noted adjacent to the cecum to suggest the presence of an acute appendicitis at this time. Vascular/Lymphatic: No evidence of significant acute traumatic injury to the abdominal aorta or the major arteries or veins of the abdomen or pelvis. Extensive aortic atherosclerosis with fusiform ectasia of the infrarenal abdominal aorta which measures up to 2.3 x 2.0 cm. No lymphadenopathy noted in the abdomen or pelvis. Reproductive: Status post hysterectomy. Ovaries are not confidently identified may be surgically absent or atrophic. Other: No high attenuation fluid collection within the peritoneal cavity or retroperitoneum to suggest significant posttraumatic hemorrhage. No significant volume of ascites. No pneumoperitoneum. Musculoskeletal: There are no acute displaced fractures or aggressive appearing lytic or blastic lesions noted in the visualized portions of the skeleton. IMPRESSION: 1. No evidence of significant acute traumatic injury to the chest, abdomen or pelvis. 2. Cardiomegaly with small bilateral pleural effusions and probable interstitial pulmonary edema; imaging findings suggestive of congestive heart  failure. 3. Aortic atherosclerosis, in addition to left main and 3 vessel coronary artery disease. There is also fusiform ectasia of the infrarenal abdominal aorta which measures up to 2.3 x 2.0 cm in diameter. 4. Small pulmonary nodule with a mean diameter of 6 mm in the superior segment of the left lower lobe. This is nonspecific, but is subpleural in location and statistically likely a subpleural lymph node. Repeat noncontrast chest CT could be considered in 1 year to re-evaluate this finding, depending on the patient's clinical situation (this is considered optional given the patient's advanced age and high likelihood of a benign lesion). 5. Large volume of stool in the rectal vault which may suggest constipation or fecal impaction. 6. Mild colonic diverticulosis without evidence of acute diverticulitis at this time. 7. Additional incidental findings, as above. Electronically Signed   By: Vinnie Langton M.D.   On: 11/17/2019 16:34   DG Pelvis Portable  Result Date: 11/17/2019 CLINICAL DATA:  Un witnessed fall. Found on the floor. Complaining of left arm and bilateral leg pain. Patient has dementia. EXAM: PORTABLE PELVIS 1-2 VIEWS COMPARISON:  None. FINDINGS: No convincing fracture.  No bone lesion. Hip joints, SI joints and symphysis pubis are normally spaced and aligned. Skeletal structures are demineralized. Soft tissues are unremarkable. Visualized colon shows increased colonic stool burden. IMPRESSION: No fracture or dislocation Electronically Signed   By: Lajean Manes M.D.   On: 11/17/2019 14:01   DG Chest Port 1 View  Result Date: 11/17/2019 CLINICAL DATA:  unwitnessed fall. She was found in the floor "halfway under the bed." Patient complaining of pain to left arm and bilateral legs but patient has dementia. Also complaining of back pain. Htn/chf/ex smoker/hx cabg EXAM: PORTABLE CHEST 1 VIEW COMPARISON:  07/11/2019. FINDINGS: Hazy bilateral airspace opacities are noted, similar to the prior  radiographs. Pattern is concerning for multifocal pneumonia. The opacities are new from a chest radiograph from 05/17/2016. There changes from cardiac surgery. Right coronary artery stent is noted. Cardiac silhouette top-normal in size to slightly enlarged. No mediastinal or hilar masses. No convincing pleural effusion.  No pneumothorax. Skeletal structures are grossly intact. IMPRESSION: 1. Hazy bilateral airspace opacities are  noted in a pattern suspicious for multifocal pneumonia. However, the appearance is similar to the most recent prior chest radiographs. Findings could potentially be due to asymmetric pulmonary edema instead of infection. 2. 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 Electronically Signed   By: Lajean Manes M.D.   On: 11/17/2019 14:00    Procedures .Critical Care Performed by: Amaryllis Dyke, PA-C Authorized by: Amaryllis Dyke, PA-C      CRITICAL CARE Performed by: Kennith Maes   Total critical care time: 55 minutes  Critical care time was exclusive of separately billable procedures and treating other patients.  Critical care was necessary to treat or prevent imminent or life-threatening deterioration.  Critical care was time spent personally by me on the following activities: development of treatment plan with patient and/or surrogate as well as nursing, discussions with consultants, evaluation of patient's response to treatment, examination of patient, obtaining history from patient or surrogate, ordering and performing treatments and interventions, ordering and review of laboratory studies, ordering and review of radiographic studies, pulse oximetry and re-evaluation of patient's condition.  (including critical care time)  Medications Ordered in ED Medications  pantoprazole (PROTONIX) 80 mg in sodium chloride 0.9 % 250 mL (0.32 mg/mL) infusion (8 mg/hr Intravenous New Bag/Given 11/17/19 1618)  sodium chloride 0.9 % bolus 250 mL (has no  administration in time range)  ceFEPIme (MAXIPIME) 2 g in sodium chloride 0.9 % 100 mL IVPB (2 g Intravenous New Bag/Given 11/17/19 1625)  metroNIDAZOLE (FLAGYL) IVPB 500 mg (has no administration in time range)  vancomycin (VANCOCIN) IVPB 1000 mg/200 mL premix (has no administration in time range)  0.9 %  sodium chloride infusion (has no administration in time range)  0.9 %  sodium chloride infusion (has no administration in time range)  coag fact Xa recombinant (ANDEXXA) low dose infusion 900 mg (has no administration in time range)  ceFEPIme (MAXIPIME) 2 g in sodium chloride 0.9 % 100 mL IVPB (has no administration in time range)  vancomycin (VANCOREADY) IVPB 500 mg/100 mL (has no administration in time range)  sodium chloride 0.9 % bolus 1,000 mL (1,000 mLs Intravenous New Bag/Given 11/17/19 1516)  pantoprazole (PROTONIX) 80 mg in sodium chloride 0.9 % 100 mL IVPB (80 mg Intravenous New Bag/Given 11/17/19 1519)  iohexol (OMNIPAQUE) 300 MG/ML solution 75 mL (75 mLs Intravenous Contrast Given 11/17/19 1554)    ED Course  I have reviewed the triage vital signs and the nursing notes.  Pertinent labs & imaging results that were available during my care of the patient were reviewed by me and considered in my medical decision making (see chart for details).  REBECCA JACQUART was evaluated in Emergency Department on 11/17/2019 for the symptoms described in the history of present illness. He/she was evaluated in the context of the global COVID-19 pandemic, which necessitated consideration that the patient might be at risk for infection with the SARS-CoV-2 virus that causes COVID-19. Institutional protocols and algorithms that pertain to the evaluation of patients at risk for COVID-19 are in a state of rapid change based on information released by regulatory bodies including the CDC and federal and state organizations. These policies and algorithms were followed during the patient's care in the ED.    MDM  Rules/Calculators/A&P                      Patient presents to the ED via EMS s/p fall @ nursing care facility.  She is afebrile, rectal temp 97.5, noted to  be hypotensive 80s/30s, tachypneic, not tachycardic. No obvious infectious source and afebrile therefore no code sepsis initiated on initial assessment. She seems to have abdominal tenderness, her exam is somewhat limited given her dementia/mental status- will obtain trauma scans. She also was noted to have dark stool- fecal occult positive, on eliquis, type & screen, protonix bolus & drip. Fluids ordered. Dr. Stark Jock aware of patient & has evaluated shortly after my initial assessment.   CXR: Hazy bilateral airspace opacities are noted in a pattern suspicious for multifocal pneumonia. However, the appearance is similar to the most recent prior chest radiographs. Findings could potentially be due to asymmetric pulmonary edema instead of infection.  Stable changes from CABG surgery  --> Patient unable to provide history, unclear if this is infectious vs. Edema, patient is however hypotensive & tachypneic- discussed with Dr. Stark Jock- will call code sepsis and give abx. Initial 1L ordered, 30 cc/kg bolus totals @ 1250 ccs--> additional 250 ccs ordered.   Pelvic xray: no fx/dislocation.   CBC: Critical anemia. Leukocytosis CMP: AKI. Electrolyte derangement & transaminitis as above.  Lactic acid elevated @ 6.4--> fluids running for full 30 cc/kg bolus.   15:25: Patient hgb returned @ 3.4 hct 13.2--> 3 units of blood ordered ( 1 unit emergent release to initiate )  16:00: I called & spoke with patient's niece who is also her health care POA, Anne Shutter- Discussed patient;s critical condition & plan of care @ length, she is requesting patient be DNI/DNR, states she would still want to receive things like blood, antibiotics, fluids etc.   16:33: CONSULT: Discussed with gastroenteroligst Dr. Gala Romney- aware of patient.   16:37: Will reverse  anticoagulation per discussion with Dr. Roderic Palau- discussed with pharmacist Donna Christen- recommends Andexxa 900 mg which has been ordered.   CT imaging without acute trauma, additional findings as above.   17:30: Blood administration has begun, BP improved to 0000000 systolic.   17:58: CONSULT: Discussed with hospitalist Dr. Nehemiah Settle- plan to continue blood products/fluids & re-discuss after able to monitor response more--> discussed with nursing, it appears @ this time patient has only received 1/3rd of first bag of blood and about 300 ccs of her initial 1L bolus ordered--> increased rate of blood administration, adjusted line of fluid IV.   1st unit of blood complete, 2nd unit being hung @ typical rate, 1L fluids completed- BP improved to 107/60  Sepsis - Repeat Assessment @ 20:15 Blood pressure 107/60, pulse 68, temperature (!) 96.8 F (36 C), temperature source Temporal, resp. rate (!) 28, height 5\' 2"  (1.575 m), weight 40.8 kg, SpO2 93 %. Heart: RRR. Lungs: decreased @ bases somewhat course- question transmitted upper airway sounds, currently on 4L via [place]. Cap refill: >2 seconds. Peripheral pulses: Palpable radial/PT. Skin: remains pale.    20:20: CONSULT: Re-discussed with Dr. Nehemiah Settle- accepts admission.   Called & spoke with Ms Redmond Pulling again to update her on results & plan of care, she is in agreement.   This is a shared visit with supervising physician Dr. Stark Jock as well as Dr. Roderic Palau @ change of shift who each independently evaluated patient & provided guidance in evaluation/management/disposition, in agreement with care   Final Clinical Impression(s) / ED Diagnoses Final diagnoses:  Acute gastrointestinal bleeding  Anemia, unspecified type  Fall, initial encounter  Lactic acidosis    Rx / DC Orders ED Discharge Orders    None       Amaryllis Dyke, PA-C 11/17/19 2055    Veryl Speak, MD 11/18/19 [phone]

## 2019-11-17 NOTE — ED Notes (Addendum)
Unable to start flagyl or vancocin at this time due to current necessary IV infusions including - protonix, PRBC, Andexxa

## 2019-11-18 ENCOUNTER — Inpatient Hospital Stay (HOSPITAL_COMMUNITY): Payer: Medicare Other

## 2019-11-18 ENCOUNTER — Encounter: Payer: Self-pay | Admitting: Internal Medicine

## 2019-11-18 DIAGNOSIS — Z7189 Other specified counseling: Secondary | ICD-10-CM

## 2019-11-18 DIAGNOSIS — J690 Pneumonitis due to inhalation of food and vomit: Secondary | ICD-10-CM

## 2019-11-18 DIAGNOSIS — A419 Sepsis, unspecified organism: Secondary | ICD-10-CM

## 2019-11-18 DIAGNOSIS — D62 Acute posthemorrhagic anemia: Secondary | ICD-10-CM

## 2019-11-18 DIAGNOSIS — I340 Nonrheumatic mitral (valve) insufficiency: Secondary | ICD-10-CM

## 2019-11-18 DIAGNOSIS — N1831 Chronic kidney disease, stage 3a: Secondary | ICD-10-CM

## 2019-11-18 DIAGNOSIS — K922 Gastrointestinal hemorrhage, unspecified: Secondary | ICD-10-CM

## 2019-11-18 DIAGNOSIS — N179 Acute kidney failure, unspecified: Secondary | ICD-10-CM

## 2019-11-18 DIAGNOSIS — I361 Nonrheumatic tricuspid (valve) insufficiency: Secondary | ICD-10-CM

## 2019-11-18 LAB — PROCALCITONIN: Procalcitonin: 0.24 ng/mL

## 2019-11-18 LAB — BASIC METABOLIC PANEL
Anion gap: 7 (ref 5–15)
BUN: 83 mg/dL — ABNORMAL HIGH (ref 8–23)
CO2: 20 mmol/L — ABNORMAL LOW (ref 22–32)
Calcium: 7.4 mg/dL — ABNORMAL LOW (ref 8.9–10.3)
Chloride: 115 mmol/L — ABNORMAL HIGH (ref 98–111)
Creatinine, Ser: 1.88 mg/dL — ABNORMAL HIGH (ref 0.44–1.00)
GFR calc Af Amer: 27 mL/min — ABNORMAL LOW (ref >60)
GFR calc non Af Amer: 23 mL/min — ABNORMAL LOW (ref >60)
Glucose, Bld: 119 mg/dL — ABNORMAL HIGH (ref 70–99)
Potassium: 3.6 mmol/L (ref 3.5–5.1)
Sodium: 142 mmol/L (ref 135–145)

## 2019-11-18 LAB — CBC
HCT: 28.6 % — ABNORMAL LOW (ref 36.0–46.0)
HCT: 25.3 % — ABNORMAL LOW (ref 36.0–46.0)
Hemoglobin: 8.5 g/dL — ABNORMAL LOW (ref 12.0–15.0)
Hemoglobin: 7.6 g/dL — ABNORMAL LOW (ref 12.0–15.0)
MCH: 24.6 pg — ABNORMAL LOW (ref 26.0–34.0)
MCH: 24.6 pg — ABNORMAL LOW (ref 26.0–34.0)
MCHC: 29.7 g/dL — ABNORMAL LOW (ref 30.0–36.0)
MCHC: 30 g/dL (ref 30.0–36.0)
MCV: 82.9 fL (ref 80.0–100.0)
MCV: 81.9 fL (ref 80.0–100.0)
Platelets: 275 10*3/uL (ref 150–400)
Platelets: 233 10*3/uL (ref 150–400)
RBC: 3.45 MIL/uL — ABNORMAL LOW (ref 3.87–5.11)
RBC: 3.09 MIL/uL — ABNORMAL LOW (ref 3.87–5.11)
RDW: 18.4 % — ABNORMAL HIGH (ref 11.5–15.5)
RDW: 18.6 % — ABNORMAL HIGH (ref 11.5–15.5)
WBC: 20.2 10*3/uL — ABNORMAL HIGH (ref 4.0–10.5)
WBC: 17.5 10*3/uL — ABNORMAL HIGH (ref 4.0–10.5)
nRBC: 1.9 % — ABNORMAL HIGH (ref 0.0–0.2)
nRBC: 2.4 % — ABNORMAL HIGH (ref 0.0–0.2)

## 2019-11-18 LAB — LACTIC ACID, PLASMA: Lactic Acid, Venous: 1.1 mmol/L (ref 0.5–1.9)

## 2019-11-18 LAB — ECHOCARDIOGRAM COMPLETE
Height: 62 in
Weight: 1440 oz

## 2019-11-18 LAB — PROTIME-INR
INR: 2 — ABNORMAL HIGH (ref 0.8–1.2)
Prothrombin Time: 22.7 seconds — ABNORMAL HIGH (ref 11.4–15.2)

## 2019-11-18 LAB — TROPONIN I (HIGH SENSITIVITY)
Troponin I (High Sensitivity): 244 ng/L (ref <18)
Troponin I (High Sensitivity): 325 ng/L (ref <18)

## 2019-11-18 MED ORDER — MORPHINE SULFATE (PF) 2 MG/ML IV SOLN: 1.0000 mg | INTRAVENOUS | Status: DC | PRN

## 2019-11-18 MED ORDER — SODIUM CHLORIDE 0.9 % IV BOLUS
250.0000 mL | Freq: Once | INTRAVENOUS | Status: AC
  Administered 2019-11-18: 250 mL via INTRAVENOUS

## 2019-11-18 MED ORDER — LORAZEPAM 2 MG/ML IJ SOLN
1.0000 mg | INTRAMUSCULAR | Status: DC | PRN
  Administered 2019-11-18 – 2019-11-20 (×3): 1 mg via INTRAVENOUS
  Filled 2019-11-18 (×3): qty 1

## 2019-11-18 MED ORDER — LORAZEPAM 2 MG/ML IJ SOLN
0.5000 mg | Freq: Once | INTRAMUSCULAR | Status: AC
  Administered 2019-11-18: 0.5 mg via INTRAVENOUS

## 2019-11-18 MED ORDER — PANTOPRAZOLE SODIUM 40 MG IV SOLR
INTRAVENOUS | Status: AC
  Filled 2019-11-18: qty 80

## 2019-11-18 MED ORDER — METRONIDAZOLE IN NACL 5-0.79 MG/ML-% IV SOLN: 500.0000 mg | Freq: Three times a day (TID) | INTRAVENOUS | Status: DC

## 2019-11-18 MED ORDER — SODIUM CHLORIDE 0.9 % IV SOLN
1.0000 g | INTRAVENOUS | Status: DC
  Filled 2019-11-18 (×2): qty 1

## 2019-11-18 MED ORDER — LORAZEPAM 2 MG/ML IJ SOLN
INTRAMUSCULAR | Status: AC
  Filled 2019-11-18: qty 1

## 2019-11-18 NOTE — Progress Notes (Signed)
Seen in ED room 18 Patient awake.  Accompanied by ED staff. No obvious bleeding overnight.  Vital signs in last 24 hours: Temp:  [96 F (35.6 C)-98.4 F (36.9 C)] 98.4 F (36.9 C) (01/16 1018) Pulse Rate:  [31-100] 74 (01/16 1000) Resp:  [15-34] 24 (01/16 1000) BP: (73-120)/(23-90) 111/50 (01/16 1000) SpO2:  [71 %-100 %] 100 % (01/16 1000) Weight:  [40.8 kg] 40.8 kg (01/15 1223)   General: Awake appears in no acute distress.  Essentially no verbalization. Abdomen: Nondistended.  Positive bowel sounds.  Soft no masses.  No guarding.    Intake/Output from previous day: 01/15 0701 - 01/16 0700 In: 2850 [I.V.:250; Blood:1260; IV Piggyback:1340] Out: -  Intake/Output this shift: No intake/output data recorded.  Lab Results: Recent Labs    11/17/19 1447 11/18/19 0025 11/18/19 0627  WBC 18.8* 20.2* 17.5*  HGB 3.4* 8.5* 7.6*  HCT 13.2* 28.6* 25.3*  PLT 353 275 233   BMET Recent Labs    11/17/19 1447 11/18/19 0627  NA 143 142  K 3.4* 3.6  CL 111 115*  CO2 18* 20*  GLUCOSE 154* 119*  BUN 91* 83*  CREATININE 2.03* 1.88*  CALCIUM 7.8* 7.4*   LFT Recent Labs    11/17/19 1447  PROT 4.9*  ALBUMIN 2.4*  AST 44*  ALT 62*  ALKPHOS 64  BILITOT 0.8   PT/INR Recent Labs    11/17/19 1447 11/18/19 0627  LABPROT 24.0* 22.7*  INR 2.2* 2.0*   Hepatitis Panel No results for input(s): HEPBSAG, HCVAB, HEPAIGM, HEPBIGM in the last 72 hours. C-Diff No results for input(s): CDIFFTOX in the last 72 hours.  Studies/Results: CT HEAD WO CONTRAST  Result Date: 11/17/2019 CLINICAL DATA:  Unwitnessed fall. Found down. History of dementia and stroke. EXAM: CT HEAD WITHOUT CONTRAST CT CERVICAL SPINE WITHOUT CONTRAST TECHNIQUE: Multidetector CT imaging of the head and cervical spine was performed following the standard protocol without intravenous contrast. Multiplanar CT image reconstructions of the cervical spine were also generated. COMPARISON:  CT head 07/08/2019. FINDINGS: CT  HEAD FINDINGS Brain: There is no evidence of acute intracranial hemorrhage, mass lesion, brain edema or extra-axial fluid collection. Stable mild atrophy with mild prominence of the ventricles and subarachnoid spaces for age. Mild chronic small vessel ischemic changes in the periventricular white matter. There is no CT evidence of acute cortical infarction. Vascular: Prominent intracranial vascular calcifications. No hyperdense vessel identified. Skull: Negative for fracture or focal lesion. Sinuses/Orbits: Chronic mucosal thickening in the sphenoid sinus. The additional paranasal sinuses, mastoid air cells and middle ears are clear. No significant orbital findings. Other: None. CT CERVICAL SPINE FINDINGS Alignment: There is a degenerative anterolisthesis at the C2-3, C3-4 and C7-T1 levels. Skull base and vertebrae: No evidence of acute fracture or traumatic subluxation. Soft tissues and spinal canal: No prevertebral fluid or swelling. No visible canal hematoma. Disc levels: There is multilevel cervical spondylosis, most advanced at the C4-5, C5-6 and C6-7 levels where there are posterior osteophytes contributing to mild foraminal narrowing bilaterally. Asymmetric facet hypertrophy is noted on the left at C3-4, contributing to moderate left foraminal narrowing. Upper chest: Biapical pleuroparenchymal scarring. Right thyroid nodularity is noted, measuring less than 1.5 cm in diameter, unlikely to be clinically significant given the patient's age. No followup imaging recommended (ref: J Am Coll Radiol. 2015 Feb;12(2): 143-50). Other: None. IMPRESSION: 1. No acute intracranial or calvarial findings. Stable mild atrophy and chronic small vessel ischemic changes. 2. No evidence of acute cervical spine fracture, traumatic subluxation or  static signs of instability. 3. Multilevel cervical spondylosis as described. Electronically Signed   By: Richardean Sale M.D.   On: 11/17/2019 16:30   CT CHEST W CONTRAST  Result  Date: 11/17/2019 CLINICAL DATA:  86 year old female with history of abdominal trauma from an unwitnessed fall. EXAM: CT CHEST, ABDOMEN, AND PELVIS WITH CONTRAST TECHNIQUE: Multidetector CT imaging of the chest, abdomen and pelvis was performed following the standard protocol during bolus administration of intravenous contrast. CONTRAST:  41mL OMNIPAQUE IOHEXOL 300 MG/ML  SOLN COMPARISON:  No priors. FINDINGS: CT CHEST FINDINGS Cardiovascular: Heart size is mildly enlarged. There is no significant pericardial fluid, thickening or pericardial calcification. There is aortic atherosclerosis, as well as atherosclerosis of the great vessels of the mediastinum and the coronary arteries, including calcified atherosclerotic plaque in the left main, left anterior descending, left circumflex and right coronary arteries. Status post median sternotomy for CABG including LIMA to the LAD. Mediastinum/Nodes: No posttraumatic hematoma identified in the mediastinum. No pathologically enlarged mediastinal or hilar lymph nodes. Esophagus is unremarkable in appearance. No axillary lymphadenopathy. Lungs/Pleura: Small bilateral pleural effusions (right greater than left). Mild ground-glass attenuation and interlobular septal thickening in the lungs bilaterally, suggesting a background of mild interstitial pulmonary edema. 8 x 4 mm (mean diameter 6 mm) pulmonary nodule in the superior segment of the left lower lobe (axial image 55 of series 4). No other larger more suspicious appearing pulmonary nodules or masses are noted. Musculoskeletal: No acute displaced fractures or aggressive appearing lytic or blastic lesions are noted in the visualized portions of the skeleton. Status post median sternotomy. CT ABDOMEN PELVIS FINDINGS Hepatobiliary: No signs of significant acute traumatic injury to the liver. No suspicious cystic or solid hepatic lesions. Status post cholecystectomy. No intrahepatic biliary ductal dilatation. Common bile duct is  dilated measuring 8 mm in the porta hepatis, within normal limits for this post cholecystectomy patient. Pancreas: No signs of significant acute traumatic injury to the pancreas. No pancreatic mass. No pancreatic ductal dilatation. No pancreatic or peripancreatic fluid collections or inflammatory changes. Spleen: No signs of acute traumatic injury to the spleen. Adrenals/Urinary Tract: No signs of acute traumatic injury to either kidney or adrenal gland. Severe atrophy of the left kidney. Right kidney is normal in appearance. Thickening of the adrenal glands bilaterally, likely reflective of adrenal hyperplasia. No hydroureteronephrosis. Urinary bladder is nearly completely decompressed, but appears intact and is otherwise unremarkable in appearance. Stomach/Bowel: No evidence of significant acute traumatic injury to the hollow viscera. Normal appearance of the stomach. No pathologic dilatation of small bowel or colon. Large amount of stool in the rectal vault which may indicate constipation. A few scattered colonic diverticulae are noted, most evident in the sigmoid colon, without surrounding inflammatory changes to suggest an acute diverticulitis at this time. The appendix is not confidently identified and may be surgically absent. Regardless, there are no inflammatory changes noted adjacent to the cecum to suggest the presence of an acute appendicitis at this time. Vascular/Lymphatic: No evidence of significant acute traumatic injury to the abdominal aorta or the major arteries or veins of the abdomen or pelvis. Extensive aortic atherosclerosis with fusiform ectasia of the infrarenal abdominal aorta which measures up to 2.3 x 2.0 cm. No lymphadenopathy noted in the abdomen or pelvis. Reproductive: Status post hysterectomy. Ovaries are not confidently identified may be surgically absent or atrophic. Other: No high attenuation fluid collection within the peritoneal cavity or retroperitoneum to suggest significant  posttraumatic hemorrhage. No significant volume of ascites. No pneumoperitoneum.  Musculoskeletal: There are no acute displaced fractures or aggressive appearing lytic or blastic lesions noted in the visualized portions of the skeleton. IMPRESSION: 1. No evidence of significant acute traumatic injury to the chest, abdomen or pelvis. 2. Cardiomegaly with small bilateral pleural effusions and probable interstitial pulmonary edema; imaging findings suggestive of congestive heart failure. 3. Aortic atherosclerosis, in addition to left main and 3 vessel coronary artery disease. There is also fusiform ectasia of the infrarenal abdominal aorta which measures up to 2.3 x 2.0 cm in diameter. 4. Small pulmonary nodule with a mean diameter of 6 mm in the superior segment of the left lower lobe. This is nonspecific, but is subpleural in location and statistically likely a subpleural lymph node. Repeat noncontrast chest CT could be considered in 1 year to re-evaluate this finding, depending on the patient's clinical situation (this is considered optional given the patient's advanced age and high likelihood of a benign lesion). 5. Large volume of stool in the rectal vault which may suggest constipation or fecal impaction. 6. Mild colonic diverticulosis without evidence of acute diverticulitis at this time. 7. Additional incidental findings, as above. Electronically Signed   By: Vinnie Langton M.D.   On: 11/17/2019 16:34   CT CERVICAL SPINE WO CONTRAST  Result Date: 11/17/2019 CLINICAL DATA:  Unwitnessed fall. Found down. History of dementia and stroke. EXAM: CT HEAD WITHOUT CONTRAST CT CERVICAL SPINE WITHOUT CONTRAST TECHNIQUE: Multidetector CT imaging of the head and cervical spine was performed following the standard protocol without intravenous contrast. Multiplanar CT image reconstructions of the cervical spine were also generated. COMPARISON:  CT head 07/08/2019. FINDINGS: CT HEAD FINDINGS Brain: There is no evidence of  acute intracranial hemorrhage, mass lesion, brain edema or extra-axial fluid collection. Stable mild atrophy with mild prominence of the ventricles and subarachnoid spaces for age. Mild chronic small vessel ischemic changes in the periventricular white matter. There is no CT evidence of acute cortical infarction. Vascular: Prominent intracranial vascular calcifications. No hyperdense vessel identified. Skull: Negative for fracture or focal lesion. Sinuses/Orbits: Chronic mucosal thickening in the sphenoid sinus. The additional paranasal sinuses, mastoid air cells and middle ears are clear. No significant orbital findings. Other: None. CT CERVICAL SPINE FINDINGS Alignment: There is a degenerative anterolisthesis at the C2-3, C3-4 and C7-T1 levels. Skull base and vertebrae: No evidence of acute fracture or traumatic subluxation. Soft tissues and spinal canal: No prevertebral fluid or swelling. No visible canal hematoma. Disc levels: There is multilevel cervical spondylosis, most advanced at the C4-5, C5-6 and C6-7 levels where there are posterior osteophytes contributing to mild foraminal narrowing bilaterally. Asymmetric facet hypertrophy is noted on the left at C3-4, contributing to moderate left foraminal narrowing. Upper chest: Biapical pleuroparenchymal scarring. Right thyroid nodularity is noted, measuring less than 1.5 cm in diameter, unlikely to be clinically significant given the patient's age. No followup imaging recommended (ref: J Am Coll Radiol. 2015 Feb;12(2): 143-50). Other: None. IMPRESSION: 1. No acute intracranial or calvarial findings. Stable mild atrophy and chronic small vessel ischemic changes. 2. No evidence of acute cervical spine fracture, traumatic subluxation or static signs of instability. 3. Multilevel cervical spondylosis as described. Electronically Signed   By: Richardean Sale M.D.   On: 11/17/2019 16:30   CT ABDOMEN PELVIS W CONTRAST  Result Date: 11/17/2019 CLINICAL DATA:   86 year old female with history of abdominal trauma from an unwitnessed fall. EXAM: CT CHEST, ABDOMEN, AND PELVIS WITH CONTRAST TECHNIQUE: Multidetector CT imaging of the chest, abdomen and pelvis was performed following  the standard protocol during bolus administration of intravenous contrast. CONTRAST:  40mL OMNIPAQUE IOHEXOL 300 MG/ML  SOLN COMPARISON:  No priors. FINDINGS: CT CHEST FINDINGS Cardiovascular: Heart size is mildly enlarged. There is no significant pericardial fluid, thickening or pericardial calcification. There is aortic atherosclerosis, as well as atherosclerosis of the great vessels of the mediastinum and the coronary arteries, including calcified atherosclerotic plaque in the left main, left anterior descending, left circumflex and right coronary arteries. Status post median sternotomy for CABG including LIMA to the LAD. Mediastinum/Nodes: No posttraumatic hematoma identified in the mediastinum. No pathologically enlarged mediastinal or hilar lymph nodes. Esophagus is unremarkable in appearance. No axillary lymphadenopathy. Lungs/Pleura: Small bilateral pleural effusions (right greater than left). Mild ground-glass attenuation and interlobular septal thickening in the lungs bilaterally, suggesting a background of mild interstitial pulmonary edema. 8 x 4 mm (mean diameter 6 mm) pulmonary nodule in the superior segment of the left lower lobe (axial image 55 of series 4). No other larger more suspicious appearing pulmonary nodules or masses are noted. Musculoskeletal: No acute displaced fractures or aggressive appearing lytic or blastic lesions are noted in the visualized portions of the skeleton. Status post median sternotomy. CT ABDOMEN PELVIS FINDINGS Hepatobiliary: No signs of significant acute traumatic injury to the liver. No suspicious cystic or solid hepatic lesions. Status post cholecystectomy. No intrahepatic biliary ductal dilatation. Common bile duct is dilated measuring 8 mm in the  porta hepatis, within normal limits for this post cholecystectomy patient. Pancreas: No signs of significant acute traumatic injury to the pancreas. No pancreatic mass. No pancreatic ductal dilatation. No pancreatic or peripancreatic fluid collections or inflammatory changes. Spleen: No signs of acute traumatic injury to the spleen. Adrenals/Urinary Tract: No signs of acute traumatic injury to either kidney or adrenal gland. Severe atrophy of the left kidney. Right kidney is normal in appearance. Thickening of the adrenal glands bilaterally, likely reflective of adrenal hyperplasia. No hydroureteronephrosis. Urinary bladder is nearly completely decompressed, but appears intact and is otherwise unremarkable in appearance. Stomach/Bowel: No evidence of significant acute traumatic injury to the hollow viscera. Normal appearance of the stomach. No pathologic dilatation of small bowel or colon. Large amount of stool in the rectal vault which may indicate constipation. A few scattered colonic diverticulae are noted, most evident in the sigmoid colon, without surrounding inflammatory changes to suggest an acute diverticulitis at this time. The appendix is not confidently identified and may be surgically absent. Regardless, there are no inflammatory changes noted adjacent to the cecum to suggest the presence of an acute appendicitis at this time. Vascular/Lymphatic: No evidence of significant acute traumatic injury to the abdominal aorta or the major arteries or veins of the abdomen or pelvis. Extensive aortic atherosclerosis with fusiform ectasia of the infrarenal abdominal aorta which measures up to 2.3 x 2.0 cm. No lymphadenopathy noted in the abdomen or pelvis. Reproductive: Status post hysterectomy. Ovaries are not confidently identified may be surgically absent or atrophic. Other: No high attenuation fluid collection within the peritoneal cavity or retroperitoneum to suggest significant posttraumatic hemorrhage. No  significant volume of ascites. No pneumoperitoneum. Musculoskeletal: There are no acute displaced fractures or aggressive appearing lytic or blastic lesions noted in the visualized portions of the skeleton. IMPRESSION: 1. No evidence of significant acute traumatic injury to the chest, abdomen or pelvis. 2. Cardiomegaly with small bilateral pleural effusions and probable interstitial pulmonary edema; imaging findings suggestive of congestive heart failure. 3. Aortic atherosclerosis, in addition to left main and 3 vessel coronary artery disease.  There is also fusiform ectasia of the infrarenal abdominal aorta which measures up to 2.3 x 2.0 cm in diameter. 4. Small pulmonary nodule with a mean diameter of 6 mm in the superior segment of the left lower lobe. This is nonspecific, but is subpleural in location and statistically likely a subpleural lymph node. Repeat noncontrast chest CT could be considered in 1 year to re-evaluate this finding, depending on the patient's clinical situation (this is considered optional given the patient's advanced age and high likelihood of a benign lesion). 5. Large volume of stool in the rectal vault which may suggest constipation or fecal impaction. 6. Mild colonic diverticulosis without evidence of acute diverticulitis at this time. 7. Additional incidental findings, as above. Electronically Signed   By: Vinnie Langton M.D.   On: 11/17/2019 16:34   DG Pelvis Portable  Result Date: 11/17/2019 CLINICAL DATA:  Un witnessed fall. Found on the floor. Complaining of left arm and bilateral leg pain. Patient has dementia. EXAM: PORTABLE PELVIS 1-2 VIEWS COMPARISON:  None. FINDINGS: No convincing fracture.  No bone lesion. Hip joints, SI joints and symphysis pubis are normally spaced and aligned. Skeletal structures are demineralized. Soft tissues are unremarkable. Visualized colon shows increased colonic stool burden. IMPRESSION: No fracture or dislocation Electronically Signed   By:  Lajean Manes M.D.   On: 11/17/2019 14:01   DG Chest Port 1 View  Result Date: 11/17/2019 CLINICAL DATA:  unwitnessed fall. She was found in the floor "halfway under the bed." Patient complaining of pain to left arm and bilateral legs but patient has dementia. Also complaining of back pain. Htn/chf/ex smoker/hx cabg EXAM: PORTABLE CHEST 1 VIEW COMPARISON:  07/11/2019. FINDINGS: Hazy bilateral airspace opacities are noted, similar to the prior radiographs. Pattern is concerning for multifocal pneumonia. The opacities are new from a chest radiograph from 05/17/2016. There changes from cardiac surgery. Right coronary artery stent is noted. Cardiac silhouette top-normal in size to slightly enlarged. No mediastinal or hilar masses. No convincing pleural effusion.  No pneumothorax. Skeletal structures are grossly intact. IMPRESSION: 1. Hazy bilateral airspace opacities are noted in a pattern suspicious for multifocal pneumonia. However, the appearance is similar to the most recent prior chest radiographs. Findings could potentially be due to asymmetric pulmonary edema instead of infection. 2. 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 Electronically Signed   By: Lajean Manes M.D.   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  Found to be profoundly anemic without overt GI bleeding but occult blood positive.  Eliquis has been held and reversed. Has remained hemodynamically stable.  Hemoglobin 7.6 this morning after transfusion.  White count improved. Anemia may be multifactorial in etiology.  GI blood loss could be coming from any number of sources.  Patient currently not a candidate for any invasive/endoscopic procedures.   Recommendations:   Continue support, follow hemoglobin.  Continue PPI.  We will continue to follow with you.

## 2019-11-18 NOTE — ED Notes (Signed)
Pt moving in bed, pulling at linens. Unable to verbalize complaint, calling out "daddy" but does not answer questions. Prn given

## 2019-11-18 NOTE — ED Notes (Signed)
Spoke with MD Maudie Mercury regarding pt declining status. Orders placed by MD to reevaluate patients Hgb and Trop. Orders entered by MD. Lab at bedside at this point to obtain samples. Primary RN notified.

## 2019-11-18 NOTE — Progress Notes (Signed)
  Echocardiogram 2D Echocardiogram has been performed.  [name] 11/18/2019, 9:28 AM

## 2019-11-18 NOTE — Sepsis Progress Note (Signed)
Notified provider of need to order repeat lactic acid, and called also  RN at bedside to follow up order.

## 2019-11-18 NOTE — ED Notes (Signed)
Dr. Kim at bedside   

## 2019-11-18 NOTE — ED Notes (Signed)
Call from fam member- wondered if pt in room yet  Informed holding 15 admits and [facility] reports will get 2 beds today so pt will be here long into the day

## 2019-11-18 NOTE — ED Notes (Signed)
Date and time results received: 11/18/19 0144(use smartphrase ".now" to insert current time)  Test: troponin Critical Value: 244  Name of Provider Notified: Dr Maudie Mercury  Orders Received? Or Actions Taken?: repeat troponin @0500 

## 2019-11-18 NOTE — ED Notes (Signed)
Pt sleeping. Nad. Chest rise and fall noted.  

## 2019-11-18 NOTE — Plan of Care (Signed)
[employer] Spoke with Anne Shutter (niece) that the condition of [name] is very tenuous and that pt has been hypotensive and may require pressors if her bp not improving with fluid/ blood.   Pt is blind and deaf. Unable to give good feed back on her own desires.  Niece is not sure about pressors,  She is going to think about whether she wants pressors for the patient.  She will call this morning to discuss with RN or MD further. For now ok to use pressors.

## 2019-11-18 NOTE — ED Notes (Signed)
CRITICAL VALUE ALERT  Critical Value:  tropnin 325  Date & Time Notied:  11/18/19 [id number]  Provider Notified: Dr Carles Collet  Orders Received/Actions taken:

## 2019-11-18 NOTE — ED Notes (Signed)
Spoke with Dr Maudie Mercury about pt CBC results- questioned if pt needed 3rd unit of blood- verbal order to HOLD and do NOT release 3rd unit of blood at this time.

## 2019-11-18 NOTE — Progress Notes (Addendum)
PROGRESS NOTE  [name] [id number] DOB: 12-20-1925 DOA: 11/17/2019 PCP: Bonnita Nasuti, MD  Brief History:  86 year old female with a history of hypothyroidism, coronary disease, hypertension, hyperlipidemia, stroke, chronic respiratory failure on 2 L, paroxysmal atrial fibrillation on apixaban presenting from the [facility] after a fall out of her bed.  It was unsure whether the patient had loss of consciousness or head injury.  Apparently, roommate witnessed her fall to the ground from bed and alerted nursing home staff.  At baseline, the patient has dementia and is minimally verbal and usually poorly comprehensible.  There is been no reports of vomiting, diarrhea, respiratory distress. In the emergency department, patient was noted to be afebrile but hypotensive with blood pressure of 73/31 initially.  WBC was 20.2 with lactic acid up to 6.4.  Hemoglobin was 3.4.  Chest x-ray showed bilateral hazy opacities.  Patient was noted to have dark stool and Hemoccult positive.  GI was consulted.  Patient was started on vancomycin, cefepime, metronidazole.  CT angiogram of the chest showed small bilateral pleural effusions, recurrent left with mild groundglass attenuation and interlobular septal thickening bilateral suggesting at least a mild degree of possible interstitial edema.  There is a large amount of stool in the rectal vault.  Otherwise no other acute findings noted in the CT of the abdomen or chest.  Pelvis x-ray was negative for any acute fractures.  Assessment/Plan: Sepsis -Present on admission -Secondary to HCAP/aspiration pneumonitis and possibly underlying bacteremia -SARS-CoV2 negative -Urinalysis was not performed initially -Collect urinalysis and urine culture although the patient has already been given antibiotics at this point -Personally reviewed chest x-ray--bilateral hazy opacities -Continue vancomycin, cefepime, metronidazole pending culture data  Lobar  pneumonia/aspiration pneumonitis -Speech therapy evaluation once the patient is more alert -Continue cefepime metronidazole  Lactic acidosis -Secondary to sepsis -Lactic acid peaked at 6.4>>> 1.1  Acute blood loss anemia -GI consulted -Continue IV Protonix -Baseline hemoglobin~12 -Transfused 3 units PRBC  Acute on chronic renal failure--CKD stage III -Baseline creatinine 0.9-1.1 -Presented with serum creatinine 2.03 -Secondary to sepsis and hemodynamic changes -Holding losartan  Paroxysmal atrial fibrillation -Continue amiodarone -Holding metoprolol succinate secondary to hypotension -Holding apixaban secondary to acute blood loss anemia  Essential hypertension -Holding losartan and metoprolol succinate secondary to hypotension  Hypothyroidism -Continue Synthroid  History of stroke -Unclear if the patient has baseline left hemiparesis -Holding aspirin and apixaban  Depression/anxiety -Restart alprazolam and Zoloft once patient is more alert   GOALS of CARE -I had a long goals of care discussion and counseling with the patient's niece who is the patient's HPOA Advance care planning, including the explanation and discussion of advance directives was carried out with the patient and family.  Code status including explanations of "Full Code" and "DNR" and alternatives were discussed in detail.  Discussion of end-of-life issues including but not limited palliative care, hospice care and the concept of hospice, other end-of-life care options, power of attorney for health care decisions, living wills, and physician orders for life-sustaining treatment were also discussed with the patient and family.  Total face to face time 20 minutes. -The patient's niece stated that patient "would not have wanted to live life this way" -She stated that the patient is essentially bedbound/wheelchair bound and poorly communicative on a daily basis -Niece stated that " if [name] was awake, she  would just tell us to stop doing all this nonsense to her and let her go" -  I discussed the patient's medical condition and updated the patient's niece -I discussed the patient's poor prognosis in the setting of her advanced age and multiple comorbidities -After discussion of the goals of care, patient's critical condition and poor prognosis, the patient's niece felt that it was in the patient's best interest to transition the patient's focus of care to focus on full comfort.  She did not want any further heroic measures, nor did she want any further diagnostic studies done at this time. -Anticipatory guidance was provided to the patient's niece regarding the patient's current condition.  Disposition Plan:   SNF pending recovery of sepsis  Family Communication:   Niece updated  Consultants:  none  Code Status:  FULL COMFORT  DVT Prophylaxis:  FULL COMFORT   Procedures: As Listed in Progress Note Above  Antibiotics: vanco 1/16>>> Cefepime 1/15>>> Metronidazole 1/15>>>     Subjective: Pt is awake and states name.  Otherwise mumbles incomprehensibly.  No reports of vomiting, diarrhea, resp distress.  Objective: Vitals:   11/18/19 0540 11/18/19 0600 11/18/19 0620 11/18/19 0700  BP: (!) 90/45 (!) 92/41 (!) 90/46 (!) 103/54  Pulse: 69 65 100 70  Resp: (!) 21 20 (!) 21 (!) 21  Temp:      TempSrc:      SpO2: 100% 100% 100% 100%  Weight:      Height:        Intake/Output Summary (Last 24 hours) at 11/18/2019 0752 Last data filed at 11/18/2019 0545 Gross per 24 hour  Intake 2850 ml  Output --  Net 2850 ml   Weight change:  Exam:   General:  Pt is alert, does not follow commands appropriately, not in acute distress  HEENT: No icterus, No thrush, No neck mass, [place]/AT  Cardiovascular: RRR, S1/S2, no rubs, no gallops  Respiratory: bilateral crackles. No wheeze  Abdomen: Soft/+BS, non tender, non distended, no guarding  Extremities: No edema, No lymphangitis, No  petechiae, No rashes, no synovitis   Data Reviewed: I have personally reviewed following labs and imaging studies Basic Metabolic Panel: Recent Labs  Lab 11/17/19 1447 11/18/19 0627  NA 143 142  K 3.4* 3.6  CL 111 115*  CO2 18* 20*  GLUCOSE 154* 119*  BUN 91* 83*  CREATININE 2.03* 1.88*  CALCIUM 7.8* 7.4*   Liver Function Tests: Recent Labs  Lab 11/17/19 1447  AST 44*  ALT 62*  ALKPHOS 64  BILITOT 0.8  PROT 4.9*  ALBUMIN 2.4*   No results for input(s): LIPASE, AMYLASE in the last 168 hours. No results for input(s): AMMONIA in the last 168 hours. Coagulation Profile: Recent Labs  Lab 11/17/19 1447 11/18/19 0627  INR 2.2* 2.0*   CBC: Recent Labs  Lab 11/17/19 1447 11/18/19 0025 11/18/19 0627  WBC 18.8* 20.2* 17.5*  HGB 3.4* 8.5* 7.6*  HCT 13.2* 28.6* 25.3*  MCV 77.2* 82.9 81.9  PLT 353 275 233   Cardiac Enzymes: No results for input(s): CKTOTAL, CKMB, CKMBINDEX, TROPONINI in the last 168 hours. BNP: Invalid input(s): POCBNP CBG: No results for input(s): GLUCAP in the last 168 hours. HbA1C: No results for input(s): HGBA1C in the last 72 hours. Urine analysis:    Component Value Date/Time   COLORURINE YELLOW 04/26/2013 1236   APPEARANCEUR CLEAR 04/26/2013 1236   LABSPEC 1.008 04/26/2013 1236   PHURINE 7.5 04/26/2013 1236   GLUCOSEU NEGATIVE 04/26/2013 1236   HGBUR NEGATIVE 04/26/2013 1236   BILIRUBINUR NEGATIVE 04/26/2013 1236   KETONESUR NEGATIVE 04/26/2013 1236  PROTEINUR NEGATIVE 04/26/2013 1236   UROBILINOGEN 0.2 04/26/2013 1236   NITRITE NEGATIVE 04/26/2013 1236   LEUKOCYTESUR TRACE (A) 04/26/2013 1236   Sepsis Labs: @LABRCNTIP (procalcitonin:4,lacticidven:4) ) Recent Results (from the past 240 hour(s))  Blood Culture (routine x 2)     Status: None (Preliminary result)   Collection Time: 11/17/19  3:30 PM   Specimen: BLOOD RIGHT WRIST  Result Value Ref Range Status   Specimen Description BLOOD RIGHT WRIST  Final   Special Requests    Final    BOTTLES DRAWN AEROBIC AND ANAEROBIC Blood Culture results may not be optimal due to an inadequate volume of blood received in culture bottles   Culture   Final    NO GROWTH < 24 HOURS Performed at [facility], [address] [place], [place] [address]    Report Status PENDING  Incomplete  Respiratory Panel by RT PCR (Flu A&B, Covid) - Nasopharyngeal Swab     Status: None   Collection Time: 11/17/19  8:01 PM   Specimen: Nasopharyngeal Swab  Result Value Ref Range Status   SARS Coronavirus 2 by RT PCR NEGATIVE NEGATIVE Final    Comment: (NOTE) SARS-CoV-2 target nucleic acids are NOT DETECTED. The SARS-CoV-2 RNA is generally detectable in upper respiratoy specimens during the acute phase of infection. The lowest concentration of SARS-CoV-2 viral copies this assay can detect is 131 copies/mL. A negative result does not preclude SARS-Cov-2 infection and should not be used as the sole basis for treatment or other patient management decisions. A negative result may occur with  improper specimen collection/handling, submission of specimen other than nasopharyngeal swab, presence of viral mutation(s) within the areas targeted by this assay, and inadequate number of viral copies (<131 copies/mL). A negative result must be combined with clinical observations, patient history, and epidemiological information. The expected result is Negative. Fact Sheet for Patients:  PinkCheek.be Fact Sheet for Healthcare Providers:  GravelBags.it This test is not yet ap proved or cleared by the [place] FDA and  has been authorized for detection and/or diagnosis of SARS-CoV-2 by FDA under an Emergency Use Authorization (EUA). This EUA will remain  in effect (meaning this test can be used) for the duration of the COVID-19 declaration under Section 564(b)(1) of the Act, 21 U.S.C. section 360bbb-3(b)(1), unless the authorization is  terminated or revoked sooner.    Influenza A by PCR NEGATIVE NEGATIVE Final   Influenza B by PCR NEGATIVE NEGATIVE Final    Comment: (NOTE) The Xpert Xpress SARS-CoV-2/FLU/RSV assay is intended as an aid in  the diagnosis of influenza from Nasopharyngeal swab specimens and  should not be used as a sole basis for treatment. Nasal washings and  aspirates are unacceptable for Xpert Xpress SARS-CoV-2/FLU/RSV  testing. Fact Sheet for Patients: PinkCheek.be Fact Sheet for Healthcare Providers: GravelBags.it This test is not yet approved or cleared by the [place] FDA and  has been authorized for detection and/or diagnosis of SARS-CoV-2 by  FDA under an Emergency Use Authorization (EUA). This EUA will remain  in effect (meaning this test can be used) for the duration of the  Covid-19 declaration under Section 564(b)(1) of the Act, 21  U.S.C. section 360bbb-3(b)(1), unless the authorization is  terminated or revoked. Performed at [facility], [address], [place], [place] [address]      Scheduled Meds: . amiodarone  100 mg Oral Daily  . anastrozole  1 mg Oral Daily  . levothyroxine  50 mcg Oral Q0600   Continuous Infusions: . sodium  chloride    . sodium chloride    . ceFEPime (MAXIPIME) IV    . pantoprozole (PROTONIX) infusion 8 mg/hr (11/18/19 0230)  . [START ON 11/19/2019] vancomycin      Procedures/Studies: CT HEAD WO CONTRAST  Result Date: 11/17/2019 CLINICAL DATA:  Unwitnessed fall. Found down. History of dementia and stroke. EXAM: CT HEAD WITHOUT CONTRAST CT CERVICAL SPINE WITHOUT CONTRAST TECHNIQUE: Multidetector CT imaging of the head and cervical spine was performed following the standard protocol without intravenous contrast. Multiplanar CT image reconstructions of the cervical spine were also generated. COMPARISON:  CT head 07/08/2019. FINDINGS: CT HEAD FINDINGS Brain: There is no evidence of acute  intracranial hemorrhage, mass lesion, brain edema or extra-axial fluid collection. Stable mild atrophy with mild prominence of the ventricles and subarachnoid spaces for age. Mild chronic small vessel ischemic changes in the periventricular white matter. There is no CT evidence of acute cortical infarction. Vascular: Prominent intracranial vascular calcifications. No hyperdense vessel identified. Skull: Negative for fracture or focal lesion. Sinuses/Orbits: Chronic mucosal thickening in the sphenoid sinus. The additional paranasal sinuses, mastoid air cells and middle ears are clear. No significant orbital findings. Other: None. CT CERVICAL SPINE FINDINGS Alignment: There is a degenerative anterolisthesis at the C2-3, C3-4 and C7-T1 levels. Skull base and vertebrae: No evidence of acute fracture or traumatic subluxation. Soft tissues and spinal canal: No prevertebral fluid or swelling. No visible canal hematoma. Disc levels: There is multilevel cervical spondylosis, most advanced at the C4-5, C5-6 and C6-7 levels where there are posterior osteophytes contributing to mild foraminal narrowing bilaterally. Asymmetric facet hypertrophy is noted on the left at C3-4, contributing to moderate left foraminal narrowing. Upper chest: Biapical pleuroparenchymal scarring. Right thyroid nodularity is noted, measuring less than 1.5 cm in diameter, unlikely to be clinically significant given the patient's age. No followup imaging recommended (ref: J Am Coll Radiol. 2015 Feb;12(2): 143-50). Other: None. IMPRESSION: 1. No acute intracranial or calvarial findings. Stable mild atrophy and chronic small vessel ischemic changes. 2. No evidence of acute cervical spine fracture, traumatic subluxation or static signs of instability. 3. Multilevel cervical spondylosis as described. Electronically Signed   By: Richardean Sale M.D.   On: 11/17/2019 16:30   CT CHEST W CONTRAST  Result Date: 11/17/2019 CLINICAL DATA:  86 year old female  with history of abdominal trauma from an unwitnessed fall. EXAM: CT CHEST, ABDOMEN, AND PELVIS WITH CONTRAST TECHNIQUE: Multidetector CT imaging of the chest, abdomen and pelvis was performed following the standard protocol during bolus administration of intravenous contrast. CONTRAST:  4mL OMNIPAQUE IOHEXOL 300 MG/ML  SOLN COMPARISON:  No priors. FINDINGS: CT CHEST FINDINGS Cardiovascular: Heart size is mildly enlarged. There is no significant pericardial fluid, thickening or pericardial calcification. There is aortic atherosclerosis, as well as atherosclerosis of the great vessels of the mediastinum and the coronary arteries, including calcified atherosclerotic plaque in the left main, left anterior descending, left circumflex and right coronary arteries. Status post median sternotomy for CABG including LIMA to the LAD. Mediastinum/Nodes: No posttraumatic hematoma identified in the mediastinum. No pathologically enlarged mediastinal or hilar lymph nodes. Esophagus is unremarkable in appearance. No axillary lymphadenopathy. Lungs/Pleura: Small bilateral pleural effusions (right greater than left). Mild ground-glass attenuation and interlobular septal thickening in the lungs bilaterally, suggesting a background of mild interstitial pulmonary edema. 8 x 4 mm (mean diameter 6 mm) pulmonary nodule in the superior segment of the left lower lobe (axial image 55 of series 4). No other larger more suspicious appearing pulmonary nodules or masses  are noted. Musculoskeletal: No acute displaced fractures or aggressive appearing lytic or blastic lesions are noted in the visualized portions of the skeleton. Status post median sternotomy. CT ABDOMEN PELVIS FINDINGS Hepatobiliary: No signs of significant acute traumatic injury to the liver. No suspicious cystic or solid hepatic lesions. Status post cholecystectomy. No intrahepatic biliary ductal dilatation. Common bile duct is dilated measuring 8 mm in the porta hepatis, within  normal limits for this post cholecystectomy patient. Pancreas: No signs of significant acute traumatic injury to the pancreas. No pancreatic mass. No pancreatic ductal dilatation. No pancreatic or peripancreatic fluid collections or inflammatory changes. Spleen: No signs of acute traumatic injury to the spleen. Adrenals/Urinary Tract: No signs of acute traumatic injury to either kidney or adrenal gland. Severe atrophy of the left kidney. Right kidney is normal in appearance. Thickening of the adrenal glands bilaterally, likely reflective of adrenal hyperplasia. No hydroureteronephrosis. Urinary bladder is nearly completely decompressed, but appears intact and is otherwise unremarkable in appearance. Stomach/Bowel: No evidence of significant acute traumatic injury to the hollow viscera. Normal appearance of the stomach. No pathologic dilatation of small bowel or colon. Large amount of stool in the rectal vault which may indicate constipation. A few scattered colonic diverticulae are noted, most evident in the sigmoid colon, without surrounding inflammatory changes to suggest an acute diverticulitis at this time. The appendix is not confidently identified and may be surgically absent. Regardless, there are no inflammatory changes noted adjacent to the cecum to suggest the presence of an acute appendicitis at this time. Vascular/Lymphatic: No evidence of significant acute traumatic injury to the abdominal aorta or the major arteries or veins of the abdomen or pelvis. Extensive aortic atherosclerosis with fusiform ectasia of the infrarenal abdominal aorta which measures up to 2.3 x 2.0 cm. No lymphadenopathy noted in the abdomen or pelvis. Reproductive: Status post hysterectomy. Ovaries are not confidently identified may be surgically absent or atrophic. Other: No high attenuation fluid collection within the peritoneal cavity or retroperitoneum to suggest significant posttraumatic hemorrhage. No significant volume of  ascites. No pneumoperitoneum. Musculoskeletal: There are no acute displaced fractures or aggressive appearing lytic or blastic lesions noted in the visualized portions of the skeleton. IMPRESSION: 1. No evidence of significant acute traumatic injury to the chest, abdomen or pelvis. 2. Cardiomegaly with small bilateral pleural effusions and probable interstitial pulmonary edema; imaging findings suggestive of congestive heart failure. 3. Aortic atherosclerosis, in addition to left main and 3 vessel coronary artery disease. There is also fusiform ectasia of the infrarenal abdominal aorta which measures up to 2.3 x 2.0 cm in diameter. 4. Small pulmonary nodule with a mean diameter of 6 mm in the superior segment of the left lower lobe. This is nonspecific, but is subpleural in location and statistically likely a subpleural lymph node. Repeat noncontrast chest CT could be considered in 1 year to re-evaluate this finding, depending on the patient's clinical situation (this is considered optional given the patient's advanced age and high likelihood of a benign lesion). 5. Large volume of stool in the rectal vault which may suggest constipation or fecal impaction. 6. Mild colonic diverticulosis without evidence of acute diverticulitis at this time. 7. Additional incidental findings, as above. Electronically Signed   By: Vinnie Langton M.D.   On: 11/17/2019 16:34   CT CERVICAL SPINE WO CONTRAST  Result Date: 11/17/2019 CLINICAL DATA:  Unwitnessed fall. Found down. History of dementia and stroke. EXAM: CT HEAD WITHOUT CONTRAST CT CERVICAL SPINE WITHOUT CONTRAST TECHNIQUE: Multidetector CT imaging  of the head and cervical spine was performed following the standard protocol without intravenous contrast. Multiplanar CT image reconstructions of the cervical spine were also generated. COMPARISON:  CT head 07/08/2019. FINDINGS: CT HEAD FINDINGS Brain: There is no evidence of acute intracranial hemorrhage, mass lesion, brain  edema or extra-axial fluid collection. Stable mild atrophy with mild prominence of the ventricles and subarachnoid spaces for age. Mild chronic small vessel ischemic changes in the periventricular white matter. There is no CT evidence of acute cortical infarction. Vascular: Prominent intracranial vascular calcifications. No hyperdense vessel identified. Skull: Negative for fracture or focal lesion. Sinuses/Orbits: Chronic mucosal thickening in the sphenoid sinus. The additional paranasal sinuses, mastoid air cells and middle ears are clear. No significant orbital findings. Other: None. CT CERVICAL SPINE FINDINGS Alignment: There is a degenerative anterolisthesis at the C2-3, C3-4 and C7-T1 levels. Skull base and vertebrae: No evidence of acute fracture or traumatic subluxation. Soft tissues and spinal canal: No prevertebral fluid or swelling. No visible canal hematoma. Disc levels: There is multilevel cervical spondylosis, most advanced at the C4-5, C5-6 and C6-7 levels where there are posterior osteophytes contributing to mild foraminal narrowing bilaterally. Asymmetric facet hypertrophy is noted on the left at C3-4, contributing to moderate left foraminal narrowing. Upper chest: Biapical pleuroparenchymal scarring. Right thyroid nodularity is noted, measuring less than 1.5 cm in diameter, unlikely to be clinically significant given the patient's age. No followup imaging recommended (ref: J Am Coll Radiol. 2015 Feb;12(2): 143-50). Other: None. IMPRESSION: 1. No acute intracranial or calvarial findings. Stable mild atrophy and chronic small vessel ischemic changes. 2. No evidence of acute cervical spine fracture, traumatic subluxation or static signs of instability. 3. Multilevel cervical spondylosis as described. Electronically Signed   By: Richardean Sale M.D.   On: 11/17/2019 16:30   CT ABDOMEN PELVIS W CONTRAST  Result Date: 11/17/2019 CLINICAL DATA:  86 year old female with history of abdominal trauma from  an unwitnessed fall. EXAM: CT CHEST, ABDOMEN, AND PELVIS WITH CONTRAST TECHNIQUE: Multidetector CT imaging of the chest, abdomen and pelvis was performed following the standard protocol during bolus administration of intravenous contrast. CONTRAST:  101mL OMNIPAQUE IOHEXOL 300 MG/ML  SOLN COMPARISON:  No priors. FINDINGS: CT CHEST FINDINGS Cardiovascular: Heart size is mildly enlarged. There is no significant pericardial fluid, thickening or pericardial calcification. There is aortic atherosclerosis, as well as atherosclerosis of the great vessels of the mediastinum and the coronary arteries, including calcified atherosclerotic plaque in the left main, left anterior descending, left circumflex and right coronary arteries. Status post median sternotomy for CABG including LIMA to the LAD. Mediastinum/Nodes: No posttraumatic hematoma identified in the mediastinum. No pathologically enlarged mediastinal or hilar lymph nodes. Esophagus is unremarkable in appearance. No axillary lymphadenopathy. Lungs/Pleura: Small bilateral pleural effusions (right greater than left). Mild ground-glass attenuation and interlobular septal thickening in the lungs bilaterally, suggesting a background of mild interstitial pulmonary edema. 8 x 4 mm (mean diameter 6 mm) pulmonary nodule in the superior segment of the left lower lobe (axial image 55 of series 4). No other larger more suspicious appearing pulmonary nodules or masses are noted. Musculoskeletal: No acute displaced fractures or aggressive appearing lytic or blastic lesions are noted in the visualized portions of the skeleton. Status post median sternotomy. CT ABDOMEN PELVIS FINDINGS Hepatobiliary: No signs of significant acute traumatic injury to the liver. No suspicious cystic or solid hepatic lesions. Status post cholecystectomy. No intrahepatic biliary ductal dilatation. Common bile duct is dilated measuring 8 mm in the porta hepatis, within  normal limits for this post  cholecystectomy patient. Pancreas: No signs of significant acute traumatic injury to the pancreas. No pancreatic mass. No pancreatic ductal dilatation. No pancreatic or peripancreatic fluid collections or inflammatory changes. Spleen: No signs of acute traumatic injury to the spleen. Adrenals/Urinary Tract: No signs of acute traumatic injury to either kidney or adrenal gland. Severe atrophy of the left kidney. Right kidney is normal in appearance. Thickening of the adrenal glands bilaterally, likely reflective of adrenal hyperplasia. No hydroureteronephrosis. Urinary bladder is nearly completely decompressed, but appears intact and is otherwise unremarkable in appearance. Stomach/Bowel: No evidence of significant acute traumatic injury to the hollow viscera. Normal appearance of the stomach. No pathologic dilatation of small bowel or colon. Large amount of stool in the rectal vault which may indicate constipation. A few scattered colonic diverticulae are noted, most evident in the sigmoid colon, without surrounding inflammatory changes to suggest an acute diverticulitis at this time. The appendix is not confidently identified and may be surgically absent. Regardless, there are no inflammatory changes noted adjacent to the cecum to suggest the presence of an acute appendicitis at this time. Vascular/Lymphatic: No evidence of significant acute traumatic injury to the abdominal aorta or the major arteries or veins of the abdomen or pelvis. Extensive aortic atherosclerosis with fusiform ectasia of the infrarenal abdominal aorta which measures up to 2.3 x 2.0 cm. No lymphadenopathy noted in the abdomen or pelvis. Reproductive: Status post hysterectomy. Ovaries are not confidently identified may be surgically absent or atrophic. Other: No high attenuation fluid collection within the peritoneal cavity or retroperitoneum to suggest significant posttraumatic hemorrhage. No significant volume of ascites. No pneumoperitoneum.  Musculoskeletal: There are no acute displaced fractures or aggressive appearing lytic or blastic lesions noted in the visualized portions of the skeleton. IMPRESSION: 1. No evidence of significant acute traumatic injury to the chest, abdomen or pelvis. 2. Cardiomegaly with small bilateral pleural effusions and probable interstitial pulmonary edema; imaging findings suggestive of congestive heart failure. 3. Aortic atherosclerosis, in addition to left main and 3 vessel coronary artery disease. There is also fusiform ectasia of the infrarenal abdominal aorta which measures up to 2.3 x 2.0 cm in diameter. 4. Small pulmonary nodule with a mean diameter of 6 mm in the superior segment of the left lower lobe. This is nonspecific, but is subpleural in location and statistically likely a subpleural lymph node. Repeat noncontrast chest CT could be considered in 1 year to re-evaluate this finding, depending on the patient's clinical situation (this is considered optional given the patient's advanced age and high likelihood of a benign lesion). 5. Large volume of stool in the rectal vault which may suggest constipation or fecal impaction. 6. Mild colonic diverticulosis without evidence of acute diverticulitis at this time. 7. Additional incidental findings, as above. Electronically Signed   By: Vinnie Langton M.D.   On: 11/17/2019 16:34   DG Pelvis Portable  Result Date: 11/17/2019 CLINICAL DATA:  Un witnessed fall. Found on the floor. Complaining of left arm and bilateral leg pain. Patient has dementia. EXAM: PORTABLE PELVIS 1-2 VIEWS COMPARISON:  None. FINDINGS: No convincing fracture.  No bone lesion. Hip joints, SI joints and symphysis pubis are normally spaced and aligned. Skeletal structures are demineralized. Soft tissues are unremarkable. Visualized colon shows increased colonic stool burden. IMPRESSION: No fracture or dislocation Electronically Signed   By: Lajean Manes M.D.   On: 11/17/2019 14:01   DG Chest  Port 1 View  Result Date: 11/17/2019 CLINICAL DATA:  unwitnessed fall. She was found in the floor "halfway under the bed." Patient complaining of pain to left arm and bilateral legs but patient has dementia. Also complaining of back pain. Htn/chf/ex smoker/hx cabg EXAM: PORTABLE CHEST 1 VIEW COMPARISON:  07/11/2019. FINDINGS: Hazy bilateral airspace opacities are noted, similar to the prior radiographs. Pattern is concerning for multifocal pneumonia. The opacities are new from a chest radiograph from 05/17/2016. There changes from cardiac surgery. Right coronary artery stent is noted. Cardiac silhouette top-normal in size to slightly enlarged. No mediastinal or hilar masses. No convincing pleural effusion.  No pneumothorax. Skeletal structures are grossly intact. IMPRESSION: 1. Hazy bilateral airspace opacities are noted in a pattern suspicious for multifocal pneumonia. However, the appearance is similar to the most recent prior chest radiographs. Findings could potentially be due to asymmetric pulmonary edema instead of infection. 2. 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 Electronically Signed   By: Lajean Manes M.D.   On: 11/17/2019 14:00    Orson Eva, DO  [facility] Pager [phone]  If 7PM-7AM, please contact night-coverage [email or web address] Password [email or web address] 11/18/2019, 7:52 AM   LOS: 1 day

## 2019-11-19 ENCOUNTER — Other Ambulatory Visit: Payer: Self-pay

## 2019-11-19 DIAGNOSIS — J690 Pneumonitis due to inhalation of food and vomit: Secondary | ICD-10-CM

## 2019-11-19 NOTE — Care Management (Signed)
Patient Information  SS# [id number]  Patient Name  Gloria, [name] ([id number]) Legal Sex  Female DOB  05-Jul-1926  Room Bed  A301 A301-01  Patient Demographics  Address  [place] [address] Phone  [phone] (Home) *Preferred*  Patient Ethnicity & Race  Ethnic Group Patient Race  Not Hispanic or Latino White or Caucasian  Emergency Contact(s)  Name Relation Home Work Mobile  Wilson,Sherree Niece [phone]    Documents on File   Status Date Received Description  Documents for the Patient  EMR Patient Summary Not Received    [place] Received 06/02/11   [place] E-Signature HIPAA Notice of Privacy Received 02/02/11   [place] E-Signature HIPAA Notice of Privacy Spanish Not Received    Driver's License Not Received    Advance Directives/Living Will/HCPOA/POA Not Received    [place] HIPAA NOTICE OF PRIVACY - Scanned Not Received    AMB HH/NH/Hospice Not Received  Cert/POC [id number] [facility]  Financial Application Not Received    AMB Correspondence Not Received  02/13 rx [employer]  Release of Information Received 12/29/11 AUTH FOR 2D ECHO 01/04/12  AMB Correspondence Not Received  03/13 Request [facility]  Insurance Card Not Received    Insurance Card Not Received    AMB HH/NH/Hospice Not Received  06/14 order Adv [facility]  AMB Provider Completed Forms Not Received  06/14 Prof Communication Adv H  AMB HH/NH/Hospice Not Received  06/14 poc Adv [facility]  Release of Information Not Received    AMB HH/NH/Hospice Not Received  06/14 ORDER ADV HC   AMB HH/NH/Hospice Not Received  06/14 POC ADV HC  AMB HH/NH/Hospice Not Received  06/14 order Adv [facility]  Insurance Card Not Received    Insurance Card Received 03/26/15 A[employer].[place] Card Received 04/12/14   HIM ROI Authorization  08/09/14 ECS representing [employer]/ [employer]  AMB Correspondence  11/30/14 NEW PATIENT EVAL [facility]  [facility]  AMB Correspondence  06/07/14 OFFICE NOTES [facility]  Other Photo ID Not Received    AMB Correspondence  05/23/15 LETTER FOSTER LPN, K  AMB HH/NH/Hospice  [id number] CERTIFICATION/POC [facility]  HIM ROI Authorization (Expired) 08/26/15 Authorization for batch [facility]/[employer]     ltd    08/26/2015.4  HIM ROI Authorization  12/03/15 Verbal Request For Medical Records  AMB Outside Consult Note  12/02/15 [facility]  AMB Outside Hospital Record  12/02/15 H&P [facility] Record  12/02/15 D/S [facility]  Release of Information Received 03/12/16 Alonna Buckler - [facility] DPR  AMB HH/NH/Hospice  03/12/16 MISSED VISIT NOTE [place] [facility]  AMB Correspondence  03/06/16 MISSED VISIT NOTE [facility]  HIM ROI Authorization (Expired) 04/01/16 Authorization for batch [employer] Medicare Risk Adjust Review fbg  03/31/2016  AMB HH/NH/Hospice  99991111 HH CERTIFICATION /POC [facility] [place]  AMB HH/NH/Hospice  [id number] CERTIFICATION POC [place] Card Received 01/13/18 AAARP MEDICARE -[facility] [place] 2018  HIM ROI Authorization (Expired) 04/28/17 Authorization for batch [facility]/[employer] Medicare Risk Adjustment Review   fbg  04/28/17  HIM ROI Authorization (Expired) 06/24/18 Authorization for batch [facility]/[employer] 2019 HEDIS   fbg  06/24/18  Insurance Card Received 12/15/18 2020 [place] Card Received 12/15/18 2020 MEDICARE CARD  HIM ROI Authorization (Expired) 08/03/19 Authorization for batch [id number]  fbg 08/03/19  Insurance Card Received (Deleted) 11/11/12  Insurance Card (Deleted) 02/02/11   HIM ROI Authorization Not Received (Deleted)  [facility] [place]  HIM ROI Authorization Not Received (Deleted)    Insurance Card Not Received (Deleted)    HIM ROI Authorization Not Received (Deleted)  [facility]/[employer] Medicare Risk  Adjustment Review  HIM ROI Authorization Not Received (Deleted)    Insurance Card Not Received (Deleted)  08/18/13  AMB Correspondence (Deleted) 11/30/14 OFFICE VISIT [facility]  AMB Outside Hospital Record (Deleted) 12/02/15 D/S [facility]  Documents for the Encounter  AOB (Assignment of Insurance Benefits) Received 11/17/19 unable to obtain due to medical condition  E-signature AOB     MEDICARE RIGHTS Not Received    E-signature Medicare Rights Unable to Obtain 11/17/19   EMS Run Sheet Received 11/17/19   Blood Consent Signed 11/17/19   ED Patient Billing Extract   ED PB Billing Extract  Echocardiogram Complete Received 11/18/19   Admission Information  Current Information  Attending Provider Admitting Provider Admission Type Admission Status  Tat, Shanon Brow, MD Orson Eva, MD Emergency Admission (Confirmed)       Admission Date/Time Discharge Date Hospital Service Auth/Cert Status  [id number] 12:03 PM  [place] Unit Room/Bed   [facility] AP-DEPT 300 A301/A301-01        Admission  Complaint  [facility] Account  Name Acct ID Class Status Primary Coverage  Gloria, [name] [id number] Inpatient [place]      Guarantor Account (for Hospital Account [id number])  Name Relation to [place]? Acct Type  [name] Self CHSA Yes Personal/Family  Address Phone    [address]  [place], [place] [address] [phone])        Coverage Information (for Hospital Account [id number])  [place] MEDICARE  F/O Payor/Plan Precert #  [facility] #  Gloria, [name] [id number]  [place] [address] [address] [phone]  2. MEDICAID [place]/MEDICAID OF [place]  F/O Payor/Plan Precert #  MEDICAID [place]/MEDICAID OF [place]   Subscriber  Subscriber #  Gloria, [name] [id number]  Address Phone  [id number]  [place], [place] [address] [phone]       Care Everywhere ID:  [id number]

## 2019-11-19 NOTE — Discharge Summary (Signed)
Physician Discharge Summary  [name] [id number] DOB: 11-13-1925 DOA: 11/17/2019  PCP: Gloria Nasuti, MD  Admit date: 11/17/2019 Discharge date: 11/20/2019  Admitted From: Home Disposition:  Residential Hospice     Discharge Condition: Stable CODE STATUS: FULL COMFORT Diet recommendation: comfort feeding   Brief/Interim Summary: 86 year old female with a history of hypothyroidism, coronary disease, hypertension, hyperlipidemia, stroke, chronic respiratory failure on 2 L, paroxysmal atrial fibrillation on apixaban presenting from the [name] after a fall out of her bed. It was unsure whether the patient had loss of consciousness or head injury. Apparently, roommate witnessed her fall to the ground from bed and alerted nursing home staff. At baseline, the patient has dementia and is minimally verbal and usually poorly comprehensible. There is been no reports of vomiting, diarrhea, respiratory distress. In the emergency department, patient was noted to be afebrile but hypotensive with blood pressure of 73/31 initially. WBC was 20.2 with lactic acid up to 6.4. Hemoglobin was 3.4. Chest x-ray showed bilateral hazy opacities. Patient was noted to have dark stool and Hemoccult positive. GI was consulted. Patient was started on vancomycin, cefepime, metronidazole. CT angiogram of the chest showed small bilateral pleural effusions, recurrent left with mild groundglass attenuation and interlobular septal thickening bilateral suggesting at least a mild degree of possible interstitial edema. There is a large amount of stool in the rectal vault. Otherwise no other acute findings noted in the CT of the abdomen or chest. Pelvis x-ray was negative for any acute fractures.  The patient was admitted for treatment of sepsis secondary to pneumonia and aspiration pneumonitis. The patient's healthcare power of attorney, her niece was contacted.  She was updated regarding the patient's  medical condition.  She stated that the patient has had a very poor quality of life the last few months.  She stated that the patient has been essentially bedbound and that the patient is blind.  At baseline, the patient is confused, and she is unable to recognize her.  Goals of care discussion was undertaken.  After discussion, the patient's niece felt that it was in the patient's best interest to transition her focus of care to focus on her comfort only.  TOC team helped transition to residential hospice.  Discharge Diagnoses:  Sepsis -Present on admission -Secondary toHCAP/aspiration pneumonitis and possibly underlying bacteremia -SARS-CoV2 negative -Urinalysis was not performed initially -Personally reviewed chest x-ray--bilateral hazy opacities -Initially started vancomycin, cefepime, metronidazole pending culture data - Goals of care discussion was undertaken.  After discussion, the patient's niece felt that it was in the patient's best interest to transition her focus of care to focus on her comfort only.  Lobar pneumonia/aspiration pneumonitis -Speech therapy evaluation once the patient is more alert -antibiotics as above -Goals of care discussion was undertaken.  After discussion, the patient's niece felt that it was in the patient's best interest to transition her focus of care to focus on her comfort only.  Lactic acidosis -Secondary to sepsis -Lactic acid peaked at 6.4>>>1.1 -Goals of care discussion was undertaken.  After discussion, the patient's niece felt that it was in the patient's best interest to transition her focus of care to focus on her comfort only.  Acute blood loss anemia -GI consulted initially and did not feel that patient was a candidate for for any invasive/endoscopic procedures -Continued IV Protonix -Baseline hemoglobin~12 -Transfused2units PRBC -Goals of care discussion was undertaken.  After discussion, the patient's niece felt that it was in the  patient's best interest to transition  her focus of care to focus on her comfort only.  Cardiomyopathy -11/18/19 Echo EF 25-30%, LBBB, G3DD -now comfort care focused  Acute on chronic renal failure--CKD stage III -Baseline creatinine 0.9-1.1 -Presented with serum creatinine 2.03 -Secondary to sepsis and hemodynamic changes -Holding losartan  Paroxysmal atrial fibrillation -Continued amiodarone initially -Holding metoprolol succinate secondary to hypotension -Holding apixaban secondary to acute blood loss anemia  Essential hypertension -Holding losartan and metoprolol succinate secondary to hypotension  Hypothyroidism -Continue Synthroid  History of stroke -Unclear if the patient has baseline left hemiparesis -Holding aspirin and apixaban  Depression/anxiety -Restart alprazolam and Zoloft once patient is more alert   GOALS of CARE -I had a long goals of care discussion and counseling with the patient's niece who is the patient'sHPOA Advance care planning, including the explanation and discussion of advance directives was carried out with the patient and family. Code status including explanations of "Full Code" and "DNR" and alternatives were discussed in detail. Discussion of end-of-life issues including but not limited palliative care, hospice care and the concept of hospice, other end-of-life care options, power of attorney for health care decisions, living wills, and physician orders for life-sustaining treatment were also discussed with the patient and family. Total face to face time 2minutes. -The patient's niece stated that patient"would not have wanted to live life this way" -She stated that the patient is essentially bedbound/wheelchair bound and poorly communicative on a daily basis -Niece stated that"if [name] was awake, she would just tell us to stop doing all this nonsense to her and let her go" -I discussed the patient's medical condition and updated  the patient's niece -I discussed the patient's poor prognosis in the setting of her advanced age and multiple comorbidities -After discussion of the goals of care, patient's critical condition and poor prognosis, the patient's niece felt that it was in the patient's best interest to transition the patient's focus of care to focus on full comfort.She did not want any further heroic measures,nor did she want any further diagnostic studies done at this time. -Anticipatory guidance was provided to the patient's niece regarding the patient's current condition. -Prognosis 1 week    Discharge Instructions   Allergies as of 11/19/2019   No Known Allergies     Medication List    STOP taking these medications   acetaminophen 325 MG tablet Commonly known as: TYLENOL   ALPRAZolam 0.25 MG tablet Commonly known as: XANAX   amiodarone 200 MG tablet Commonly known as: PACERONE   anastrozole 1 MG tablet Commonly known as: ARIMIDEX   ARTIFICIAL TEARS OP   aspirin EC 81 MG tablet   Biotin 5000 MCG Tabs   CALTRATE 600+D PLUS PO   famotidine 40 MG tablet Commonly known as: PEPCID   furosemide 40 MG tablet Commonly known as: LASIX   IRON 27 PO   Klor-Con M20 20 MEQ tablet Generic drug: potassium chloride SA   levothyroxine 50 MCG tablet Commonly known as: SYNTHROID   loratadine 10 MG tablet Commonly known as: CLARITIN   losartan 25 MG tablet Commonly known as: COZAAR   metoprolol succinate 25 MG 24 hr tablet Commonly known as: TOPROL-XL   mirtazapine 7.5 MG tablet Commonly known as: REMERON   nitroGLYCERIN 0.4 MG SL tablet Commonly known as: NITROSTAT   sertraline 25 MG tablet Commonly known as: ZOLOFT   tetrahydrozoline-zinc 0.05-0.25 % ophthalmic solution Commonly known as: VISINE-AC   vitamin B-12 1000 MCG tablet Commonly known as: CYANOCOBALAMIN   Vitamin D3 25 MCG (  1000 UT) Caps       No Known  Allergies  Consultations:  none   Procedures/Studies: CT HEAD WO CONTRAST  Result Date: 11/17/2019 CLINICAL DATA:  Unwitnessed fall. Found down. History of dementia and stroke. EXAM: CT HEAD WITHOUT CONTRAST CT CERVICAL SPINE WITHOUT CONTRAST TECHNIQUE: Multidetector CT imaging of the head and cervical spine was performed following the standard protocol without intravenous contrast. Multiplanar CT image reconstructions of the cervical spine were also generated. COMPARISON:  CT head 07/08/2019. FINDINGS: CT HEAD FINDINGS Brain: There is no evidence of acute intracranial hemorrhage, mass lesion, brain edema or extra-axial fluid collection. Stable mild atrophy with mild prominence of the ventricles and subarachnoid spaces for age. Mild chronic small vessel ischemic changes in the periventricular white matter. There is no CT evidence of acute cortical infarction. Vascular: Prominent intracranial vascular calcifications. No hyperdense vessel identified. Skull: Negative for fracture or focal lesion. Sinuses/Orbits: Chronic mucosal thickening in the sphenoid sinus. The additional paranasal sinuses, mastoid air cells and middle ears are clear. No significant orbital findings. Other: None. CT CERVICAL SPINE FINDINGS Alignment: There is a degenerative anterolisthesis at the C2-3, C3-4 and C7-T1 levels. Skull base and vertebrae: No evidence of acute fracture or traumatic subluxation. Soft tissues and spinal canal: No prevertebral fluid or swelling. No visible canal hematoma. Disc levels: There is multilevel cervical spondylosis, most advanced at the C4-5, C5-6 and C6-7 levels where there are posterior osteophytes contributing to mild foraminal narrowing bilaterally. Asymmetric facet hypertrophy is noted on the left at C3-4, contributing to moderate left foraminal narrowing. Upper chest: Biapical pleuroparenchymal scarring. Right thyroid nodularity is noted, measuring less than 1.5 cm in diameter, unlikely to be  clinically significant given the patient's age. No followup imaging recommended (ref: J Am Coll Radiol. 2015 Feb;12(2): 143-50). Other: None. IMPRESSION: 1. No acute intracranial or calvarial findings. Stable mild atrophy and chronic small vessel ischemic changes. 2. No evidence of acute cervical spine fracture, traumatic subluxation or static signs of instability. 3. Multilevel cervical spondylosis as described. Electronically Signed   By: Richardean Sale M.D.   On: 11/17/2019 16:30   CT CHEST W CONTRAST  Result Date: 11/17/2019 CLINICAL DATA:  86 year old female with history of abdominal trauma from an unwitnessed fall. EXAM: CT CHEST, ABDOMEN, AND PELVIS WITH CONTRAST TECHNIQUE: Multidetector CT imaging of the chest, abdomen and pelvis was performed following the standard protocol during bolus administration of intravenous contrast. CONTRAST:  1mL OMNIPAQUE IOHEXOL 300 MG/ML  SOLN COMPARISON:  No priors. FINDINGS: CT CHEST FINDINGS Cardiovascular: Heart size is mildly enlarged. There is no significant pericardial fluid, thickening or pericardial calcification. There is aortic atherosclerosis, as well as atherosclerosis of the great vessels of the mediastinum and the coronary arteries, including calcified atherosclerotic plaque in the left main, left anterior descending, left circumflex and right coronary arteries. Status post median sternotomy for CABG including LIMA to the LAD. Mediastinum/Nodes: No posttraumatic hematoma identified in the mediastinum. No pathologically enlarged mediastinal or hilar lymph nodes. Esophagus is unremarkable in appearance. No axillary lymphadenopathy. Lungs/Pleura: Small bilateral pleural effusions (right greater than left). Mild ground-glass attenuation and interlobular septal thickening in the lungs bilaterally, suggesting a background of mild interstitial pulmonary edema. 8 x 4 mm (mean diameter 6 mm) pulmonary nodule in the superior segment of the left lower lobe (axial  image 55 of series 4). No other larger more suspicious appearing pulmonary nodules or masses are noted. Musculoskeletal: No acute displaced fractures or aggressive appearing lytic or blastic lesions are noted in  the visualized portions of the skeleton. Status post median sternotomy. CT ABDOMEN PELVIS FINDINGS Hepatobiliary: No signs of significant acute traumatic injury to the liver. No suspicious cystic or solid hepatic lesions. Status post cholecystectomy. No intrahepatic biliary ductal dilatation. Common bile duct is dilated measuring 8 mm in the porta hepatis, within normal limits for this post cholecystectomy patient. Pancreas: No signs of significant acute traumatic injury to the pancreas. No pancreatic mass. No pancreatic ductal dilatation. No pancreatic or peripancreatic fluid collections or inflammatory changes. Spleen: No signs of acute traumatic injury to the spleen. Adrenals/Urinary Tract: No signs of acute traumatic injury to either kidney or adrenal gland. Severe atrophy of the left kidney. Right kidney is normal in appearance. Thickening of the adrenal glands bilaterally, likely reflective of adrenal hyperplasia. No hydroureteronephrosis. Urinary bladder is nearly completely decompressed, but appears intact and is otherwise unremarkable in appearance. Stomach/Bowel: No evidence of significant acute traumatic injury to the hollow viscera. Normal appearance of the stomach. No pathologic dilatation of small bowel or colon. Large amount of stool in the rectal vault which may indicate constipation. A few scattered colonic diverticulae are noted, most evident in the sigmoid colon, without surrounding inflammatory changes to suggest an acute diverticulitis at this time. The appendix is not confidently identified and may be surgically absent. Regardless, there are no inflammatory changes noted adjacent to the cecum to suggest the presence of an acute appendicitis at this time. Vascular/Lymphatic: No evidence  of significant acute traumatic injury to the abdominal aorta or the major arteries or veins of the abdomen or pelvis. Extensive aortic atherosclerosis with fusiform ectasia of the infrarenal abdominal aorta which measures up to 2.3 x 2.0 cm. No lymphadenopathy noted in the abdomen or pelvis. Reproductive: Status post hysterectomy. Ovaries are not confidently identified may be surgically absent or atrophic. Other: No high attenuation fluid collection within the peritoneal cavity or retroperitoneum to suggest significant posttraumatic hemorrhage. No significant volume of ascites. No pneumoperitoneum. Musculoskeletal: There are no acute displaced fractures or aggressive appearing lytic or blastic lesions noted in the visualized portions of the skeleton. IMPRESSION: 1. No evidence of significant acute traumatic injury to the chest, abdomen or pelvis. 2. Cardiomegaly with small bilateral pleural effusions and probable interstitial pulmonary edema; imaging findings suggestive of congestive heart failure. 3. Aortic atherosclerosis, in addition to left main and 3 vessel coronary artery disease. There is also fusiform ectasia of the infrarenal abdominal aorta which measures up to 2.3 x 2.0 cm in diameter. 4. Small pulmonary nodule with a mean diameter of 6 mm in the superior segment of the left lower lobe. This is nonspecific, but is subpleural in location and statistically likely a subpleural lymph node. Repeat noncontrast chest CT could be considered in 1 year to re-evaluate this finding, depending on the patient's clinical situation (this is considered optional given the patient's advanced age and high likelihood of a benign lesion). 5. Large volume of stool in the rectal vault which may suggest constipation or fecal impaction. 6. Mild colonic diverticulosis without evidence of acute diverticulitis at this time. 7. Additional incidental findings, as above. Electronically Signed   By: Vinnie Langton M.D.   On: 11/17/2019  16:34   CT CERVICAL SPINE WO CONTRAST  Result Date: 11/17/2019 CLINICAL DATA:  Unwitnessed fall. Found down. History of dementia and stroke. EXAM: CT HEAD WITHOUT CONTRAST CT CERVICAL SPINE WITHOUT CONTRAST TECHNIQUE: Multidetector CT imaging of the head and cervical spine was performed following the standard protocol without intravenous contrast. Multiplanar CT  image reconstructions of the cervical spine were also generated. COMPARISON:  CT head 07/08/2019. FINDINGS: CT HEAD FINDINGS Brain: There is no evidence of acute intracranial hemorrhage, mass lesion, brain edema or extra-axial fluid collection. Stable mild atrophy with mild prominence of the ventricles and subarachnoid spaces for age. Mild chronic small vessel ischemic changes in the periventricular white matter. There is no CT evidence of acute cortical infarction. Vascular: Prominent intracranial vascular calcifications. No hyperdense vessel identified. Skull: Negative for fracture or focal lesion. Sinuses/Orbits: Chronic mucosal thickening in the sphenoid sinus. The additional paranasal sinuses, mastoid air cells and middle ears are clear. No significant orbital findings. Other: None. CT CERVICAL SPINE FINDINGS Alignment: There is a degenerative anterolisthesis at the C2-3, C3-4 and C7-T1 levels. Skull base and vertebrae: No evidence of acute fracture or traumatic subluxation. Soft tissues and spinal canal: No prevertebral fluid or swelling. No visible canal hematoma. Disc levels: There is multilevel cervical spondylosis, most advanced at the C4-5, C5-6 and C6-7 levels where there are posterior osteophytes contributing to mild foraminal narrowing bilaterally. Asymmetric facet hypertrophy is noted on the left at C3-4, contributing to moderate left foraminal narrowing. Upper chest: Biapical pleuroparenchymal scarring. Right thyroid nodularity is noted, measuring less than 1.5 cm in diameter, unlikely to be clinically significant given the patient's age.  No followup imaging recommended (ref: J Am Coll Radiol. 2015 Feb;12(2): 143-50). Other: None. IMPRESSION: 1. No acute intracranial or calvarial findings. Stable mild atrophy and chronic small vessel ischemic changes. 2. No evidence of acute cervical spine fracture, traumatic subluxation or static signs of instability. 3. Multilevel cervical spondylosis as described. Electronically Signed   By: Richardean Sale M.D.   On: 11/17/2019 16:30   CT ABDOMEN PELVIS W CONTRAST  Result Date: 11/17/2019 CLINICAL DATA:  86 year old female with history of abdominal trauma from an unwitnessed fall. EXAM: CT CHEST, ABDOMEN, AND PELVIS WITH CONTRAST TECHNIQUE: Multidetector CT imaging of the chest, abdomen and pelvis was performed following the standard protocol during bolus administration of intravenous contrast. CONTRAST:  2mL OMNIPAQUE IOHEXOL 300 MG/ML  SOLN COMPARISON:  No priors. FINDINGS: CT CHEST FINDINGS Cardiovascular: Heart size is mildly enlarged. There is no significant pericardial fluid, thickening or pericardial calcification. There is aortic atherosclerosis, as well as atherosclerosis of the great vessels of the mediastinum and the coronary arteries, including calcified atherosclerotic plaque in the left main, left anterior descending, left circumflex and right coronary arteries. Status post median sternotomy for CABG including LIMA to the LAD. Mediastinum/Nodes: No posttraumatic hematoma identified in the mediastinum. No pathologically enlarged mediastinal or hilar lymph nodes. Esophagus is unremarkable in appearance. No axillary lymphadenopathy. Lungs/Pleura: Small bilateral pleural effusions (right greater than left). Mild ground-glass attenuation and interlobular septal thickening in the lungs bilaterally, suggesting a background of mild interstitial pulmonary edema. 8 x 4 mm (mean diameter 6 mm) pulmonary nodule in the superior segment of the left lower lobe (axial image 55 of series 4). No other larger  more suspicious appearing pulmonary nodules or masses are noted. Musculoskeletal: No acute displaced fractures or aggressive appearing lytic or blastic lesions are noted in the visualized portions of the skeleton. Status post median sternotomy. CT ABDOMEN PELVIS FINDINGS Hepatobiliary: No signs of significant acute traumatic injury to the liver. No suspicious cystic or solid hepatic lesions. Status post cholecystectomy. No intrahepatic biliary ductal dilatation. Common bile duct is dilated measuring 8 mm in the porta hepatis, within normal limits for this post cholecystectomy patient. Pancreas: No signs of significant acute traumatic injury to the  pancreas. No pancreatic mass. No pancreatic ductal dilatation. No pancreatic or peripancreatic fluid collections or inflammatory changes. Spleen: No signs of acute traumatic injury to the spleen. Adrenals/Urinary Tract: No signs of acute traumatic injury to either kidney or adrenal gland. Severe atrophy of the left kidney. Right kidney is normal in appearance. Thickening of the adrenal glands bilaterally, likely reflective of adrenal hyperplasia. No hydroureteronephrosis. Urinary bladder is nearly completely decompressed, but appears intact and is otherwise unremarkable in appearance. Stomach/Bowel: No evidence of significant acute traumatic injury to the hollow viscera. Normal appearance of the stomach. No pathologic dilatation of small bowel or colon. Large amount of stool in the rectal vault which may indicate constipation. A few scattered colonic diverticulae are noted, most evident in the sigmoid colon, without surrounding inflammatory changes to suggest an acute diverticulitis at this time. The appendix is not confidently identified and may be surgically absent. Regardless, there are no inflammatory changes noted adjacent to the cecum to suggest the presence of an acute appendicitis at this time. Vascular/Lymphatic: No evidence of significant acute traumatic injury  to the abdominal aorta or the major arteries or veins of the abdomen or pelvis. Extensive aortic atherosclerosis with fusiform ectasia of the infrarenal abdominal aorta which measures up to 2.3 x 2.0 cm. No lymphadenopathy noted in the abdomen or pelvis. Reproductive: Status post hysterectomy. Ovaries are not confidently identified may be surgically absent or atrophic. Other: No high attenuation fluid collection within the peritoneal cavity or retroperitoneum to suggest significant posttraumatic hemorrhage. No significant volume of ascites. No pneumoperitoneum. Musculoskeletal: There are no acute displaced fractures or aggressive appearing lytic or blastic lesions noted in the visualized portions of the skeleton. IMPRESSION: 1. No evidence of significant acute traumatic injury to the chest, abdomen or pelvis. 2. Cardiomegaly with small bilateral pleural effusions and probable interstitial pulmonary edema; imaging findings suggestive of congestive heart failure. 3. Aortic atherosclerosis, in addition to left main and 3 vessel coronary artery disease. There is also fusiform ectasia of the infrarenal abdominal aorta which measures up to 2.3 x 2.0 cm in diameter. 4. Small pulmonary nodule with a mean diameter of 6 mm in the superior segment of the left lower lobe. This is nonspecific, but is subpleural in location and statistically likely a subpleural lymph node. Repeat noncontrast chest CT could be considered in 1 year to re-evaluate this finding, depending on the patient's clinical situation (this is considered optional given the patient's advanced age and high likelihood of a benign lesion). 5. Large volume of stool in the rectal vault which may suggest constipation or fecal impaction. 6. Mild colonic diverticulosis without evidence of acute diverticulitis at this time. 7. Additional incidental findings, as above. Electronically Signed   By: Vinnie Langton M.D.   On: 11/17/2019 16:34   DG Pelvis  Portable  Result Date: 11/17/2019 CLINICAL DATA:  Un witnessed fall. Found on the floor. Complaining of left arm and bilateral leg pain. Patient has dementia. EXAM: PORTABLE PELVIS 1-2 VIEWS COMPARISON:  None. FINDINGS: No convincing fracture.  No bone lesion. Hip joints, SI joints and symphysis pubis are normally spaced and aligned. Skeletal structures are demineralized. Soft tissues are unremarkable. Visualized colon shows increased colonic stool burden. IMPRESSION: No fracture or dislocation Electronically Signed   By: Lajean Manes M.D.   On: 11/17/2019 14:01   DG Chest Port 1 View  Result Date: 11/17/2019 CLINICAL DATA:  unwitnessed fall. She was found in the floor "halfway under the bed." Patient complaining of pain to left  arm and bilateral legs but patient has dementia. Also complaining of back pain. Htn/chf/ex smoker/hx cabg EXAM: PORTABLE CHEST 1 VIEW COMPARISON:  07/11/2019. FINDINGS: Hazy bilateral airspace opacities are noted, similar to the prior radiographs. Pattern is concerning for multifocal pneumonia. The opacities are new from a chest radiograph from 05/17/2016. There changes from cardiac surgery. Right coronary artery stent is noted. Cardiac silhouette top-normal in size to slightly enlarged. No mediastinal or hilar masses. No convincing pleural effusion.  No pneumothorax. Skeletal structures are grossly intact. IMPRESSION: 1. Hazy bilateral airspace opacities are noted in a pattern suspicious for multifocal pneumonia. However, the appearance is similar to the most recent prior chest radiographs. Findings could potentially be due to asymmetric pulmonary edema instead of infection. 2. 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 Electronically Signed   By: Lajean Manes M.D.   On: 11/17/2019 14:00   ECHOCARDIOGRAM COMPLETE  Result Date: 11/18/2019   ECHOCARDIOGRAM REPORT   Patient Name:   [name] Date of Exam: 11/18/2019 Medical Rec #:  [id number]        Height:       62.0 in Accession #:     [id number]       Weight:       90.0 lb Date of Birth:  09-Dec-1925        BSA:          1.36 m Patient Age:    40 years         BP:           96/42 mmHg Patient Gender: F                HR:           70 bpm. Exam Location:  [facility] Procedure: 2D Echo, Cardiac Doppler and Color Doppler Indications:    Elevated Troponin  History:        Patient has prior history of Echocardiogram examinations, most                 recent 12/10/2015. CAD, Stroke, Arrythmias:Atrial Fibrillation;                 Risk Factors:Hypertension and Dyslipidemia. Resp. failure.  Sonographer:    Dustin Flock RDCS Referring Phys: [place]  1. Left ventricular ejection fraction, by visual estimation, is 25 to 30%. The left ventricle has moderate to severely decreased function. There is no left ventricular hypertrophy.  2. Abnormal septal motion consistent with left bundle branch block.  3. Elevated left atrial and left ventricular end-diastolic pressures.  4. Left ventricular diastolic parameters are consistent with Grade III diastolic dysfunction (restrictive).  5. The left ventricle demonstrates global hypokinesis.  6. Global right ventricle has low normal systolic function.The right ventricular size is normal. No increase in right ventricular wall thickness.  7. Left atrial size was severely dilated.  8. Right atrial size was normal.  9. The mitral valve is abnormal. Moderate mitral valve regurgitation. 10. The tricuspid valve is grossly normal. 11. The aortic valve is tricuspid. Aortic valve regurgitation is trivial. Mild aortic valve sclerosis without stenosis. 12. The pulmonic valve was grossly normal. Pulmonic valve regurgitation is trivial. 13. Moderately elevated pulmonary artery systolic pressure. 14. The inferior vena cava is normal in size with <50% respiratory variability, suggesting right atrial pressure of 8 mmHg. FINDINGS  Left Ventricle: Left ventricular ejection fraction, by visual estimation, is 25 to 30%.  The left ventricle has moderate to severely decreased function. The  left ventricle demonstrates global hypokinesis. There is no left ventricular hypertrophy. Abnormal (paradoxical) septal motion, consistent with left bundle branch block. Left ventricular diastolic parameters are consistent with Grade III diastolic dysfunction (restrictive). Elevated left atrial and left ventricular end-diastolic pressures. Right Ventricle: The right ventricular size is normal. No increase in right ventricular wall thickness. Global RV systolic function is has low normal systolic function. The tricuspid regurgitant velocity is 3.09 m/s, and with an assumed right atrial pressure of 8 mmHg, the estimated right ventricular systolic pressure is moderately elevated at 46.1 mmHg. Left Atrium: Left atrial size was severely dilated. Right Atrium: Right atrial size was normal in size Pericardium: There is no evidence of pericardial effusion. Mitral Valve: The mitral valve is abnormal. There is mild thickening of the mitral valve leaflet(s). Moderate mitral valve regurgitation. Tricuspid Valve: The tricuspid valve is grossly normal. Tricuspid valve regurgitation mild-moderate. Aortic Valve: The aortic valve is tricuspid. Aortic valve regurgitation is trivial. Aortic regurgitation PHT measures 465 msec. Mild aortic valve sclerosis is present, with no evidence of aortic valve stenosis. Pulmonic Valve: The pulmonic valve was grossly normal. Pulmonic valve regurgitation is trivial. Pulmonic regurgitation is trivial. Aorta: The aortic root and ascending aorta are structurally normal, with no evidence of dilitation. Venous: The inferior vena cava is normal in size with less than 50% respiratory variability, suggesting right atrial pressure of 8 mmHg. IAS/Shunts: No atrial level shunt detected by color flow Doppler.  LEFT VENTRICLE PLAX 2D LVIDd:         5.29 cm  Diastology LVIDs:         4.45 cm  LV e' lateral:   5.70 cm/s LV PW:         1.03 cm  LV  E/e' lateral: 18.4 LV IVS:        0.84 cm  LV e' medial:    4.05 cm/s LVOT diam:     2.00 cm  LV E/e' medial:  25.9 LV SV:         45 ml LV SV Index:   33.73 LVOT Area:     3.14 cm  RIGHT VENTRICLE RV Basal diam:  2.18 cm RV S prime:     3.71 cm/s TAPSE (M-mode): 1.5 cm LEFT ATRIUM             Index       RIGHT ATRIUM           Index LA diam:        4.10 cm 3.01 cm/m  RA Area:     16.00 cm LA Vol (A2C):   80.1 ml 58.83 ml/m RA Volume:   44.50 ml  32.68 ml/m LA Vol (A4C):   73.9 ml 54.28 ml/m LA Biplane Vol: 79.3 ml 58.24 ml/m  AORTIC VALVE LVOT Vmax:   110.00 cm/s LVOT Vmean:  71.900 cm/s LVOT VTI:    0.209 m AI PHT:      465 msec  AORTA Ao Root diam: 2.80 cm MITRAL VALVE                         TRICUSPID VALVE MV Area (PHT): 4.31 cm              TR Peak grad:   38.1 mmHg MV PHT:        51.04 msec            TR Vmax:        333.00 cm/s MV Decel Time:  176 msec MV E velocity: 105.00 cm/s 103 cm/s  SHUNTS MV A velocity: 48.50 cm/s  70.3 cm/s Systemic VTI:  0.21 m MV E/A ratio:  2.16        1.5       Systemic Diam: 2.00 cm  Lyman Bishop MD Electronically signed by Lyman Bishop MD Signature Date/Time: 11/18/2019/4:04:39 PM    Final         Discharge Exam: Vitals:   11/18/19 2331 11/19/19 0558  BP: (!) 103/42 (!) 125/58  Pulse: 81 87  Resp: (!) 21 (!) 21  Temp: 98.1 F (36.7 C) 97.6 F (36.4 C)  SpO2: 91% (!) 87%   Vitals:   11/18/19 1910 11/18/19 2232 11/18/19 2331 11/19/19 0558  BP: (!) 130/47 (!) 124/53 (!) 103/42 (!) 125/58  Pulse: 84 80 81 87  Resp: 19 16 (!) 21 (!) 21  Temp:  98.2 F (36.8 C) 98.1 F (36.7 C) 97.6 F (36.4 C)  TempSrc:  Axillary Oral   SpO2: 93% 93% 91% (!) 87%  Weight:   39.6 kg   Height:        General: Pt is awake, not in acute distress Cardiovascular: RRR, S1/S2 +, no rubs, no gallops Respiratory: poor inspiratory effort, bibasilar rales Abdominal: Soft, NT, ND, bowel sounds + Extremities: no edema, no cyanosis   The results of significant  diagnostics from this hospitalization (including imaging, microbiology, ancillary and laboratory) are listed below for reference.    Significant Diagnostic Studies: CT HEAD WO CONTRAST  Result Date: 11/17/2019 CLINICAL DATA:  Unwitnessed fall. Found down. History of dementia and stroke. EXAM: CT HEAD WITHOUT CONTRAST CT CERVICAL SPINE WITHOUT CONTRAST TECHNIQUE: Multidetector CT imaging of the head and cervical spine was performed following the standard protocol without intravenous contrast. Multiplanar CT image reconstructions of the cervical spine were also generated. COMPARISON:  CT head 07/08/2019. FINDINGS: CT HEAD FINDINGS Brain: There is no evidence of acute intracranial hemorrhage, mass lesion, brain edema or extra-axial fluid collection. Stable mild atrophy with mild prominence of the ventricles and subarachnoid spaces for age. Mild chronic small vessel ischemic changes in the periventricular white matter. There is no CT evidence of acute cortical infarction. Vascular: Prominent intracranial vascular calcifications. No hyperdense vessel identified. Skull: Negative for fracture or focal lesion. Sinuses/Orbits: Chronic mucosal thickening in the sphenoid sinus. The additional paranasal sinuses, mastoid air cells and middle ears are clear. No significant orbital findings. Other: None. CT CERVICAL SPINE FINDINGS Alignment: There is a degenerative anterolisthesis at the C2-3, C3-4 and C7-T1 levels. Skull base and vertebrae: No evidence of acute fracture or traumatic subluxation. Soft tissues and spinal canal: No prevertebral fluid or swelling. No visible canal hematoma. Disc levels: There is multilevel cervical spondylosis, most advanced at the C4-5, C5-6 and C6-7 levels where there are posterior osteophytes contributing to mild foraminal narrowing bilaterally. Asymmetric facet hypertrophy is noted on the left at C3-4, contributing to moderate left foraminal narrowing. Upper chest: Biapical pleuroparenchymal  scarring. Right thyroid nodularity is noted, measuring less than 1.5 cm in diameter, unlikely to be clinically significant given the patient's age. No followup imaging recommended (ref: J Am Coll Radiol. 2015 Feb;12(2): 143-50). Other: None. IMPRESSION: 1. No acute intracranial or calvarial findings. Stable mild atrophy and chronic small vessel ischemic changes. 2. No evidence of acute cervical spine fracture, traumatic subluxation or static signs of instability. 3. Multilevel cervical spondylosis as described. Electronically Signed   By: Richardean Sale M.D.   On: 11/17/2019 16:30   CT  CHEST W CONTRAST  Result Date: 11/17/2019 CLINICAL DATA:  86 year old female with history of abdominal trauma from an unwitnessed fall. EXAM: CT CHEST, ABDOMEN, AND PELVIS WITH CONTRAST TECHNIQUE: Multidetector CT imaging of the chest, abdomen and pelvis was performed following the standard protocol during bolus administration of intravenous contrast. CONTRAST:  46mL OMNIPAQUE IOHEXOL 300 MG/ML  SOLN COMPARISON:  No priors. FINDINGS: CT CHEST FINDINGS Cardiovascular: Heart size is mildly enlarged. There is no significant pericardial fluid, thickening or pericardial calcification. There is aortic atherosclerosis, as well as atherosclerosis of the great vessels of the mediastinum and the coronary arteries, including calcified atherosclerotic plaque in the left main, left anterior descending, left circumflex and right coronary arteries. Status post median sternotomy for CABG including LIMA to the LAD. Mediastinum/Nodes: No posttraumatic hematoma identified in the mediastinum. No pathologically enlarged mediastinal or hilar lymph nodes. Esophagus is unremarkable in appearance. No axillary lymphadenopathy. Lungs/Pleura: Small bilateral pleural effusions (right greater than left). Mild ground-glass attenuation and interlobular septal thickening in the lungs bilaterally, suggesting a background of mild interstitial pulmonary edema. 8 x  4 mm (mean diameter 6 mm) pulmonary nodule in the superior segment of the left lower lobe (axial image 55 of series 4). No other larger more suspicious appearing pulmonary nodules or masses are noted. Musculoskeletal: No acute displaced fractures or aggressive appearing lytic or blastic lesions are noted in the visualized portions of the skeleton. Status post median sternotomy. CT ABDOMEN PELVIS FINDINGS Hepatobiliary: No signs of significant acute traumatic injury to the liver. No suspicious cystic or solid hepatic lesions. Status post cholecystectomy. No intrahepatic biliary ductal dilatation. Common bile duct is dilated measuring 8 mm in the porta hepatis, within normal limits for this post cholecystectomy patient. Pancreas: No signs of significant acute traumatic injury to the pancreas. No pancreatic mass. No pancreatic ductal dilatation. No pancreatic or peripancreatic fluid collections or inflammatory changes. Spleen: No signs of acute traumatic injury to the spleen. Adrenals/Urinary Tract: No signs of acute traumatic injury to either kidney or adrenal gland. Severe atrophy of the left kidney. Right kidney is normal in appearance. Thickening of the adrenal glands bilaterally, likely reflective of adrenal hyperplasia. No hydroureteronephrosis. Urinary bladder is nearly completely decompressed, but appears intact and is otherwise unremarkable in appearance. Stomach/Bowel: No evidence of significant acute traumatic injury to the hollow viscera. Normal appearance of the stomach. No pathologic dilatation of small bowel or colon. Large amount of stool in the rectal vault which may indicate constipation. A few scattered colonic diverticulae are noted, most evident in the sigmoid colon, without surrounding inflammatory changes to suggest an acute diverticulitis at this time. The appendix is not confidently identified and may be surgically absent. Regardless, there are no inflammatory changes noted adjacent to the  cecum to suggest the presence of an acute appendicitis at this time. Vascular/Lymphatic: No evidence of significant acute traumatic injury to the abdominal aorta or the major arteries or veins of the abdomen or pelvis. Extensive aortic atherosclerosis with fusiform ectasia of the infrarenal abdominal aorta which measures up to 2.3 x 2.0 cm. No lymphadenopathy noted in the abdomen or pelvis. Reproductive: Status post hysterectomy. Ovaries are not confidently identified may be surgically absent or atrophic. Other: No high attenuation fluid collection within the peritoneal cavity or retroperitoneum to suggest significant posttraumatic hemorrhage. No significant volume of ascites. No pneumoperitoneum. Musculoskeletal: There are no acute displaced fractures or aggressive appearing lytic or blastic lesions noted in the visualized portions of the skeleton. IMPRESSION: 1. No evidence of significant  acute traumatic injury to the chest, abdomen or pelvis. 2. Cardiomegaly with small bilateral pleural effusions and probable interstitial pulmonary edema; imaging findings suggestive of congestive heart failure. 3. Aortic atherosclerosis, in addition to left main and 3 vessel coronary artery disease. There is also fusiform ectasia of the infrarenal abdominal aorta which measures up to 2.3 x 2.0 cm in diameter. 4. Small pulmonary nodule with a mean diameter of 6 mm in the superior segment of the left lower lobe. This is nonspecific, but is subpleural in location and statistically likely a subpleural lymph node. Repeat noncontrast chest CT could be considered in 1 year to re-evaluate this finding, depending on the patient's clinical situation (this is considered optional given the patient's advanced age and high likelihood of a benign lesion). 5. Large volume of stool in the rectal vault which may suggest constipation or fecal impaction. 6. Mild colonic diverticulosis without evidence of acute diverticulitis at this time. 7.  Additional incidental findings, as above. Electronically Signed   By: Vinnie Langton M.D.   On: 11/17/2019 16:34   CT CERVICAL SPINE WO CONTRAST  Result Date: 11/17/2019 CLINICAL DATA:  Unwitnessed fall. Found down. History of dementia and stroke. EXAM: CT HEAD WITHOUT CONTRAST CT CERVICAL SPINE WITHOUT CONTRAST TECHNIQUE: Multidetector CT imaging of the head and cervical spine was performed following the standard protocol without intravenous contrast. Multiplanar CT image reconstructions of the cervical spine were also generated. COMPARISON:  CT head 07/08/2019. FINDINGS: CT HEAD FINDINGS Brain: There is no evidence of acute intracranial hemorrhage, mass lesion, brain edema or extra-axial fluid collection. Stable mild atrophy with mild prominence of the ventricles and subarachnoid spaces for age. Mild chronic small vessel ischemic changes in the periventricular white matter. There is no CT evidence of acute cortical infarction. Vascular: Prominent intracranial vascular calcifications. No hyperdense vessel identified. Skull: Negative for fracture or focal lesion. Sinuses/Orbits: Chronic mucosal thickening in the sphenoid sinus. The additional paranasal sinuses, mastoid air cells and middle ears are clear. No significant orbital findings. Other: None. CT CERVICAL SPINE FINDINGS Alignment: There is a degenerative anterolisthesis at the C2-3, C3-4 and C7-T1 levels. Skull base and vertebrae: No evidence of acute fracture or traumatic subluxation. Soft tissues and spinal canal: No prevertebral fluid or swelling. No visible canal hematoma. Disc levels: There is multilevel cervical spondylosis, most advanced at the C4-5, C5-6 and C6-7 levels where there are posterior osteophytes contributing to mild foraminal narrowing bilaterally. Asymmetric facet hypertrophy is noted on the left at C3-4, contributing to moderate left foraminal narrowing. Upper chest: Biapical pleuroparenchymal scarring. Right thyroid nodularity is  noted, measuring less than 1.5 cm in diameter, unlikely to be clinically significant given the patient's age. No followup imaging recommended (ref: J Am Coll Radiol. 2015 Feb;12(2): 143-50). Other: None. IMPRESSION: 1. No acute intracranial or calvarial findings. Stable mild atrophy and chronic small vessel ischemic changes. 2. No evidence of acute cervical spine fracture, traumatic subluxation or static signs of instability. 3. Multilevel cervical spondylosis as described. Electronically Signed   By: Richardean Sale M.D.   On: 11/17/2019 16:30   CT ABDOMEN PELVIS W CONTRAST  Result Date: 11/17/2019 CLINICAL DATA:  86 year old female with history of abdominal trauma from an unwitnessed fall. EXAM: CT CHEST, ABDOMEN, AND PELVIS WITH CONTRAST TECHNIQUE: Multidetector CT imaging of the chest, abdomen and pelvis was performed following the standard protocol during bolus administration of intravenous contrast. CONTRAST:  98mL OMNIPAQUE IOHEXOL 300 MG/ML  SOLN COMPARISON:  No priors. FINDINGS: CT CHEST FINDINGS Cardiovascular: Heart  size is mildly enlarged. There is no significant pericardial fluid, thickening or pericardial calcification. There is aortic atherosclerosis, as well as atherosclerosis of the great vessels of the mediastinum and the coronary arteries, including calcified atherosclerotic plaque in the left main, left anterior descending, left circumflex and right coronary arteries. Status post median sternotomy for CABG including LIMA to the LAD. Mediastinum/Nodes: No posttraumatic hematoma identified in the mediastinum. No pathologically enlarged mediastinal or hilar lymph nodes. Esophagus is unremarkable in appearance. No axillary lymphadenopathy. Lungs/Pleura: Small bilateral pleural effusions (right greater than left). Mild ground-glass attenuation and interlobular septal thickening in the lungs bilaterally, suggesting a background of mild interstitial pulmonary edema. 8 x 4 mm (mean diameter 6 mm)  pulmonary nodule in the superior segment of the left lower lobe (axial image 55 of series 4). No other larger more suspicious appearing pulmonary nodules or masses are noted. Musculoskeletal: No acute displaced fractures or aggressive appearing lytic or blastic lesions are noted in the visualized portions of the skeleton. Status post median sternotomy. CT ABDOMEN PELVIS FINDINGS Hepatobiliary: No signs of significant acute traumatic injury to the liver. No suspicious cystic or solid hepatic lesions. Status post cholecystectomy. No intrahepatic biliary ductal dilatation. Common bile duct is dilated measuring 8 mm in the porta hepatis, within normal limits for this post cholecystectomy patient. Pancreas: No signs of significant acute traumatic injury to the pancreas. No pancreatic mass. No pancreatic ductal dilatation. No pancreatic or peripancreatic fluid collections or inflammatory changes. Spleen: No signs of acute traumatic injury to the spleen. Adrenals/Urinary Tract: No signs of acute traumatic injury to either kidney or adrenal gland. Severe atrophy of the left kidney. Right kidney is normal in appearance. Thickening of the adrenal glands bilaterally, likely reflective of adrenal hyperplasia. No hydroureteronephrosis. Urinary bladder is nearly completely decompressed, but appears intact and is otherwise unremarkable in appearance. Stomach/Bowel: No evidence of significant acute traumatic injury to the hollow viscera. Normal appearance of the stomach. No pathologic dilatation of small bowel or colon. Large amount of stool in the rectal vault which may indicate constipation. A few scattered colonic diverticulae are noted, most evident in the sigmoid colon, without surrounding inflammatory changes to suggest an acute diverticulitis at this time. The appendix is not confidently identified and may be surgically absent. Regardless, there are no inflammatory changes noted adjacent to the cecum to suggest the presence  of an acute appendicitis at this time. Vascular/Lymphatic: No evidence of significant acute traumatic injury to the abdominal aorta or the major arteries or veins of the abdomen or pelvis. Extensive aortic atherosclerosis with fusiform ectasia of the infrarenal abdominal aorta which measures up to 2.3 x 2.0 cm. No lymphadenopathy noted in the abdomen or pelvis. Reproductive: Status post hysterectomy. Ovaries are not confidently identified may be surgically absent or atrophic. Other: No high attenuation fluid collection within the peritoneal cavity or retroperitoneum to suggest significant posttraumatic hemorrhage. No significant volume of ascites. No pneumoperitoneum. Musculoskeletal: There are no acute displaced fractures or aggressive appearing lytic or blastic lesions noted in the visualized portions of the skeleton. IMPRESSION: 1. No evidence of significant acute traumatic injury to the chest, abdomen or pelvis. 2. Cardiomegaly with small bilateral pleural effusions and probable interstitial pulmonary edema; imaging findings suggestive of congestive heart failure. 3. Aortic atherosclerosis, in addition to left main and 3 vessel coronary artery disease. There is also fusiform ectasia of the infrarenal abdominal aorta which measures up to 2.3 x 2.0 cm in diameter. 4. Small pulmonary nodule with a mean diameter  of 6 mm in the superior segment of the left lower lobe. This is nonspecific, but is subpleural in location and statistically likely a subpleural lymph node. Repeat noncontrast chest CT could be considered in 1 year to re-evaluate this finding, depending on the patient's clinical situation (this is considered optional given the patient's advanced age and high likelihood of a benign lesion). 5. Large volume of stool in the rectal vault which may suggest constipation or fecal impaction. 6. Mild colonic diverticulosis without evidence of acute diverticulitis at this time. 7. Additional incidental findings, as  above. Electronically Signed   By: Vinnie Langton M.D.   On: 11/17/2019 16:34   DG Pelvis Portable  Result Date: 11/17/2019 CLINICAL DATA:  Un witnessed fall. Found on the floor. Complaining of left arm and bilateral leg pain. Patient has dementia. EXAM: PORTABLE PELVIS 1-2 VIEWS COMPARISON:  None. FINDINGS: No convincing fracture.  No bone lesion. Hip joints, SI joints and symphysis pubis are normally spaced and aligned. Skeletal structures are demineralized. Soft tissues are unremarkable. Visualized colon shows increased colonic stool burden. IMPRESSION: No fracture or dislocation Electronically Signed   By: Lajean Manes M.D.   On: 11/17/2019 14:01   DG Chest Port 1 View  Result Date: 11/17/2019 CLINICAL DATA:  unwitnessed fall. She was found in the floor "halfway under the bed." Patient complaining of pain to left arm and bilateral legs but patient has dementia. Also complaining of back pain. Htn/chf/ex smoker/hx cabg EXAM: PORTABLE CHEST 1 VIEW COMPARISON:  07/11/2019. FINDINGS: Hazy bilateral airspace opacities are noted, similar to the prior radiographs. Pattern is concerning for multifocal pneumonia. The opacities are new from a chest radiograph from 05/17/2016. There changes from cardiac surgery. Right coronary artery stent is noted. Cardiac silhouette top-normal in size to slightly enlarged. No mediastinal or hilar masses. No convincing pleural effusion.  No pneumothorax. Skeletal structures are grossly intact. IMPRESSION: 1. Hazy bilateral airspace opacities are noted in a pattern suspicious for multifocal pneumonia. However, the appearance is similar to the most recent prior chest radiographs. Findings could potentially be due to asymmetric pulmonary edema instead of infection. 2. 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 Electronically Signed   By: Lajean Manes M.D.   On: 11/17/2019 14:00   ECHOCARDIOGRAM COMPLETE  Result Date: 11/18/2019   ECHOCARDIOGRAM REPORT   Patient Name:   [name]  Date of Exam: 11/18/2019 Medical Rec #:  [id number]        Height:       62.0 in Accession #:    [id number]       Weight:       90.0 lb Date of Birth:  29-Feb-1926        BSA:          1.36 m Patient Age:    7 years         BP:           96/42 mmHg Patient Gender: F                HR:           70 bpm. Exam Location:  [facility] Procedure: 2D Echo, Cardiac Doppler and Color Doppler Indications:    Elevated Troponin  History:        Patient has prior history of Echocardiogram examinations, most                 recent 12/10/2015. CAD, Stroke, Arrythmias:Atrial Fibrillation;  Risk Factors:Hypertension and Dyslipidemia. Resp. failure.  Sonographer:    Dustin Flock RDCS Referring Phys: [place]  1. Left ventricular ejection fraction, by visual estimation, is 25 to 30%. The left ventricle has moderate to severely decreased function. There is no left ventricular hypertrophy.  2. Abnormal septal motion consistent with left bundle branch block.  3. Elevated left atrial and left ventricular end-diastolic pressures.  4. Left ventricular diastolic parameters are consistent with Grade III diastolic dysfunction (restrictive).  5. The left ventricle demonstrates global hypokinesis.  6. Global right ventricle has low normal systolic function.The right ventricular size is normal. No increase in right ventricular wall thickness.  7. Left atrial size was severely dilated.  8. Right atrial size was normal.  9. The mitral valve is abnormal. Moderate mitral valve regurgitation. 10. The tricuspid valve is grossly normal. 11. The aortic valve is tricuspid. Aortic valve regurgitation is trivial. Mild aortic valve sclerosis without stenosis. 12. The pulmonic valve was grossly normal. Pulmonic valve regurgitation is trivial. 13. Moderately elevated pulmonary artery systolic pressure. 14. The inferior vena cava is normal in size with <50% respiratory variability, suggesting right atrial pressure of 8 mmHg.  FINDINGS  Left Ventricle: Left ventricular ejection fraction, by visual estimation, is 25 to 30%. The left ventricle has moderate to severely decreased function. The left ventricle demonstrates global hypokinesis. There is no left ventricular hypertrophy. Abnormal (paradoxical) septal motion, consistent with left bundle branch block. Left ventricular diastolic parameters are consistent with Grade III diastolic dysfunction (restrictive). Elevated left atrial and left ventricular end-diastolic pressures. Right Ventricle: The right ventricular size is normal. No increase in right ventricular wall thickness. Global RV systolic function is has low normal systolic function. The tricuspid regurgitant velocity is 3.09 m/s, and with an assumed right atrial pressure of 8 mmHg, the estimated right ventricular systolic pressure is moderately elevated at 46.1 mmHg. Left Atrium: Left atrial size was severely dilated. Right Atrium: Right atrial size was normal in size Pericardium: There is no evidence of pericardial effusion. Mitral Valve: The mitral valve is abnormal. There is mild thickening of the mitral valve leaflet(s). Moderate mitral valve regurgitation. Tricuspid Valve: The tricuspid valve is grossly normal. Tricuspid valve regurgitation mild-moderate. Aortic Valve: The aortic valve is tricuspid. Aortic valve regurgitation is trivial. Aortic regurgitation PHT measures 465 msec. Mild aortic valve sclerosis is present, with no evidence of aortic valve stenosis. Pulmonic Valve: The pulmonic valve was grossly normal. Pulmonic valve regurgitation is trivial. Pulmonic regurgitation is trivial. Aorta: The aortic root and ascending aorta are structurally normal, with no evidence of dilitation. Venous: The inferior vena cava is normal in size with less than 50% respiratory variability, suggesting right atrial pressure of 8 mmHg. IAS/Shunts: No atrial level shunt detected by color flow Doppler.  LEFT VENTRICLE PLAX 2D LVIDd:          5.29 cm  Diastology LVIDs:         4.45 cm  LV e' lateral:   5.70 cm/s LV PW:         1.03 cm  LV E/e' lateral: 18.4 LV IVS:        0.84 cm  LV e' medial:    4.05 cm/s LVOT diam:     2.00 cm  LV E/e' medial:  25.9 LV SV:         45 ml LV SV Index:   33.73 LVOT Area:     3.14 cm  RIGHT VENTRICLE RV Basal diam:  2.18 cm RV  S prime:     3.71 cm/s TAPSE (M-mode): 1.5 cm LEFT ATRIUM             Index       RIGHT ATRIUM           Index LA diam:        4.10 cm 3.01 cm/m  RA Area:     16.00 cm LA Vol (A2C):   80.1 ml 58.83 ml/m RA Volume:   44.50 ml  32.68 ml/m LA Vol (A4C):   73.9 ml 54.28 ml/m LA Biplane Vol: 79.3 ml 58.24 ml/m  AORTIC VALVE LVOT Vmax:   110.00 cm/s LVOT Vmean:  71.900 cm/s LVOT VTI:    0.209 m AI PHT:      465 msec  AORTA Ao Root diam: 2.80 cm MITRAL VALVE                         TRICUSPID VALVE MV Area (PHT): 4.31 cm              TR Peak grad:   38.1 mmHg MV PHT:        51.04 msec            TR Vmax:        333.00 cm/s MV Decel Time: 176 msec MV E velocity: 105.00 cm/s 103 cm/s  SHUNTS MV A velocity: 48.50 cm/s  70.3 cm/s Systemic VTI:  0.21 m MV E/A ratio:  2.16        1.5       Systemic Diam: 2.00 cm  Lyman Bishop MD Electronically signed by Lyman Bishop MD Signature Date/Time: 11/18/2019/4:04:39 PM    Final      Microbiology: Recent Results (from the past 240 hour(s))  Blood Culture (routine x 2)     Status: None (Preliminary result)   Collection Time: 11/17/19  3:30 PM   Specimen: BLOOD RIGHT WRIST  Result Value Ref Range Status   Specimen Description BLOOD RIGHT WRIST  Final   Special Requests   Final    BOTTLES DRAWN AEROBIC AND ANAEROBIC Blood Culture results may not be optimal due to an inadequate volume of blood received in culture bottles   Culture   Final    NO GROWTH < 24 HOURS Performed at [facility], [address], [place], [place] [address]    Report Status PENDING  Incomplete  Respiratory Panel by RT PCR (Flu A&B, Covid) - Nasopharyngeal Swab     Status: None    Collection Time: 11/17/19  8:01 PM   Specimen: Nasopharyngeal Swab  Result Value Ref Range Status   SARS Coronavirus 2 by RT PCR NEGATIVE NEGATIVE Final    Comment: (NOTE) SARS-CoV-2 target nucleic acids are NOT DETECTED. The SARS-CoV-2 RNA is generally detectable in upper respiratoy specimens during the acute phase of infection. The lowest concentration of SARS-CoV-2 viral copies this assay can detect is 131 copies/mL. A negative result does not preclude SARS-Cov-2 infection and should not be used as the sole basis for treatment or other patient management decisions. A negative result may occur with  improper specimen collection/handling, submission of specimen other than nasopharyngeal swab, presence of viral mutation(s) within the areas targeted by this assay, and inadequate number of viral copies (<131 copies/mL). A negative result must be combined with clinical observations, patient history, and epidemiological information. The expected result is Negative. Fact Sheet for Patients:  PinkCheek.be Fact Sheet for Healthcare Providers:  GravelBags.it This test  is not yet ap proved or cleared by the [place] and  has been authorized for detection and/or diagnosis of SARS-CoV-2 by FDA under an Emergency Use Authorization (EUA). This EUA will remain  in effect (meaning this test can be used) for the duration of the COVID-19 declaration under Section 564(b)(1) of the Act, 21 U.S.C. section 360bbb-3(b)(1), unless the authorization is terminated or revoked sooner.    Influenza A by PCR NEGATIVE NEGATIVE Final   Influenza B by PCR NEGATIVE NEGATIVE Final    Comment: (NOTE) The Xpert Xpress SARS-CoV-2/FLU/RSV assay is intended as an aid in  the diagnosis of influenza from Nasopharyngeal swab specimens and  should not be used as a sole basis for treatment. Nasal washings and  aspirates are unacceptable for Xpert Xpress  SARS-CoV-2/FLU/RSV  testing. Fact Sheet for Patients: PinkCheek.be Fact Sheet for Healthcare Providers: GravelBags.it This test is not yet approved or cleared by the [place] FDA and  has been authorized for detection and/or diagnosis of SARS-CoV-2 by  FDA under an Emergency Use Authorization (EUA). This EUA will remain  in effect (meaning this test can be used) for the duration of the  Covid-19 declaration under Section 564(b)(1) of the Act, 21  U.S.C. section 360bbb-3(b)(1), unless the authorization is  terminated or revoked. Performed at [facility], [address], [place], [place] [address]      Labs: Basic Metabolic Panel: Recent Labs  Lab 11/17/19 1447 11/18/19 0627  NA 143 142  K 3.4* 3.6  CL 111 115*  CO2 18* 20*  GLUCOSE 154* 119*  BUN 91* 83*  CREATININE 2.03* 1.88*  CALCIUM 7.8* 7.4*   Liver Function Tests: Recent Labs  Lab 11/17/19 1447  AST 44*  ALT 62*  ALKPHOS 64  BILITOT 0.8  PROT 4.9*  ALBUMIN 2.4*   No results for input(s): LIPASE, AMYLASE in the last 168 hours. No results for input(s): AMMONIA in the last 168 hours. CBC: Recent Labs  Lab 11/17/19 1447 11/18/19 0025 11/18/19 0627  WBC 18.8* 20.2* 17.5*  HGB 3.4* 8.5* 7.6*  HCT 13.2* 28.6* 25.3*  MCV 77.2* 82.9 81.9  PLT 353 275 233   Cardiac Enzymes: No results for input(s): CKTOTAL, CKMB, CKMBINDEX, TROPONINI in the last 168 hours. BNP: Invalid input(s): POCBNP CBG: No results for input(s): GLUCAP in the last 168 hours.  Time coordinating discharge:  36 minutes  Signed:  Orson Eva, DO [facility] Pager: [phone] 11/19/2019, 4:15 PM

## 2019-11-19 NOTE — Care Management (Signed)
Referral faxed to [facility]. Referral being reviewed for approval this afternoon and pt may transfer 1/18.

## 2019-11-19 NOTE — Progress Notes (Signed)
PROGRESS NOTE  [name] [id number] DOB: 02-26-1926 DOA: 11/17/2019 PCP: Bonnita Nasuti, MD  Brief History:  86 year old female with a history of hypothyroidism, coronary disease, hypertension, hyperlipidemia, stroke, chronic respiratory failure on 2 L, paroxysmal atrial fibrillation on apixaban presenting from the [facility] after a fall out of her bed.  It was unsure whether the patient had loss of consciousness or head injury.  Apparently, roommate witnessed her fall to the ground from bed and alerted nursing home staff.  At baseline, the patient has dementia and is minimally verbal and usually poorly comprehensible.  There is been no reports of vomiting, diarrhea, respiratory distress. In the emergency department, patient was noted to be afebrile but hypotensive with blood pressure of 73/31 initially.  WBC was 20.2 with lactic acid up to 6.4.  Hemoglobin was 3.4.  Chest x-ray showed bilateral hazy opacities.  Patient was noted to have dark stool and Hemoccult positive.  GI was consulted.  Patient was started on vancomycin, cefepime, metronidazole.  CT angiogram of the chest showed small bilateral pleural effusions, recurrent left with mild groundglass attenuation and interlobular septal thickening bilateral suggesting at least a mild degree of possible interstitial edema.  There is a large amount of stool in the rectal vault.  Otherwise no other acute findings noted in the CT of the abdomen or chest.  Pelvis x-ray was negative for any acute fractures.  The patient was admitted for treatment of sepsis secondary to pneumonia and aspiration pneumonitis. The patient's healthcare power of attorney, her niece was contacted.  She was updated regarding the patient's medical condition.  She stated that the patient has had a very poor quality of life the last few months.  She stated that the patient has been essentially bedbound and that the patient is blind.  At baseline, the patient is  confused, and she is unable to recognize her.  Goals of care discussion was undertaken.  After discussion, the patient's niece felt that it was in the patient's best interest to transition her focus of care to focus on her comfort only.  Assessment/Plan: Sepsis -Present on admission -Secondary to HCAP/aspiration pneumonitis and possibly underlying bacteremia -SARS-CoV2 negative -Urinalysis was not performed initially -Personally reviewed chest x-ray--bilateral hazy opacities -Initially started vancomycin, cefepime, metronidazole pending culture data - Goals of care discussion was undertaken.  After discussion, the patient's niece felt that it was in the patient's best interest to transition her focus of care to focus on her comfort only.  Lobar pneumonia/aspiration pneumonitis -Speech therapy evaluation once the patient is more alert -antibiotics as above -Goals of care discussion was undertaken.  After discussion, the patient's niece felt that it was in the patient's best interest to transition her focus of care to focus on her comfort only.  Lactic acidosis -Secondary to sepsis -Lactic acid peaked at 6.4>>> 1.1  Acute blood loss anemia -GI consulted initially and did not feel that patient was a candidate for for any invasive/endoscopic procedures -Continued IV Protonix -Baseline hemoglobin~12 -Transfused 2 units PRBC -Goals of care discussion was undertaken.  After discussion, the patient's niece felt that it was in the patient's best interest to transition her focus of care to focus on her comfort only.  Acute on chronic renal failure--CKD stage III -Baseline creatinine 0.9-1.1 -Presented with serum creatinine 2.03 -Secondary to sepsis and hemodynamic changes -Holding losartan  Paroxysmal atrial fibrillation -Continued amiodarone initially -Holding metoprolol succinate secondary to hypotension -Holding apixaban  secondary to acute blood loss anemia  Essential  hypertension -Holding losartan and metoprolol succinate secondary to hypotension  Hypothyroidism -Continue Synthroid  History of stroke -Unclear if the patient has baseline left hemiparesis -Holding aspirin and apixaban  Depression/anxiety -Restart alprazolam and Zoloft once patient is more alert   GOALS of CARE -I had a long goals of care discussion and counseling with the patient's niece who is the patient's HPOA Advance care planning, including the explanation and discussion of advance directives was carried out with the patient and family.  Code status including explanations of "Full Code" and "DNR" and alternatives were discussed in detail.  Discussion of end-of-life issues including but not limited palliative care, hospice care and the concept of hospice, other end-of-life care options, power of attorney for health care decisions, living wills, and physician orders for life-sustaining treatment were also discussed with the patient and family.  Total face to face time 20 minutes. -The patient's niece stated that patient "would not have wanted to live life this way" -She stated that the patient is essentially bedbound/wheelchair bound and poorly communicative on a daily basis -Niece stated that " if [name] was awake, she would just tell us to stop doing all this nonsense to her and let her go" -I discussed the patient's medical condition and updated the patient's niece -I discussed the patient's poor prognosis in the setting of her advanced age and multiple comorbidities -After discussion of the goals of care, patient's critical condition and poor prognosis, the patient's niece felt that it was in the patient's best interest to transition the patient's focus of care to focus on full comfort.  She did not want any further heroic measures, nor did she want any further diagnostic studies done at this time. -Anticipatory guidance was provided to the patient's niece regarding the  patient's current condition. -Prognosis 1 week  Disposition Plan:   [facility] if stable 1/18 Family Communication:   Niece updated 1/17  Consultants:  none  Code Status:  FULL COMFORT  DVT Prophylaxis:  FULL COMFORT   Procedures: As Listed in Progress Note Above  Antibiotics: vanco 1/16>>>1/16 Cefepime 1/15>>>1/16 Metronidazole 1/15>>>1/16    Subjective: Patient is awake and moans mama during the examination.  There is no reports of vomiting, respiratory distress, diarrhea, uncontrolled pain.  Objective: Vitals:   11/18/19 1910 11/18/19 2232 11/18/19 2331 11/19/19 0558  BP: (!) 130/47 (!) 124/53 (!) 103/42 (!) 125/58  Pulse: 84 80 81 87  Resp: 19 16 (!) 21 (!) 21  Temp:  98.2 F (36.8 C) 98.1 F (36.7 C) 97.6 F (36.4 C)  TempSrc:  Axillary Oral   SpO2: 93% 93% 91% (!) 87%  Weight:   39.6 kg   Height:        Intake/Output Summary (Last 24 hours) at 11/19/2019 1237 Last data filed at 11/19/2019 0300 Gross per 24 hour  Intake 475.53 ml  Output --  Net 475.53 ml   Weight change: -1.224 kg Exam:   General:  Pt is alert, does not follow commands appropriately, not in acute distress  HEENT: No icterus, No thrush, No neck mass, [place]/AT  Cardiovascular: RRR, S1/S2, no rubs, no gallops  Respiratory: bibasilar rales. Diminished BS  Abdomen: Soft/+BS, non tender, non distended, no guarding  Extremities: No edema, No lymphangitis, No petechiae, No rashes, no synovitis   Data Reviewed: I have personally reviewed following labs and imaging studies Basic Metabolic Panel: Recent Labs  Lab 11/17/19 1447 11/18/19 0627  NA 143 142  K 3.4* 3.6  CL 111 115*  CO2 18* 20*  GLUCOSE 154* 119*  BUN 91* 83*  CREATININE 2.03* 1.88*  CALCIUM 7.8* 7.4*   Liver Function Tests: Recent Labs  Lab 11/17/19 1447  AST 44*  ALT 62*  ALKPHOS 64  BILITOT 0.8  PROT 4.9*  ALBUMIN 2.4*   No results for input(s): LIPASE, AMYLASE in the last 168  hours. No results for input(s): AMMONIA in the last 168 hours. Coagulation Profile: Recent Labs  Lab 11/17/19 1447 11/18/19 0627  INR 2.2* 2.0*   CBC: Recent Labs  Lab 11/17/19 1447 11/18/19 0025 11/18/19 0627  WBC 18.8* 20.2* 17.5*  HGB 3.4* 8.5* 7.6*  HCT 13.2* 28.6* 25.3*  MCV 77.2* 82.9 81.9  PLT 353 275 233   Cardiac Enzymes: No results for input(s): CKTOTAL, CKMB, CKMBINDEX, TROPONINI in the last 168 hours. BNP: Invalid input(s): POCBNP CBG: No results for input(s): GLUCAP in the last 168 hours. HbA1C: No results for input(s): HGBA1C in the last 72 hours. Urine analysis:    Component Value Date/Time   COLORURINE YELLOW 04/26/2013 1236   APPEARANCEUR CLEAR 04/26/2013 1236   LABSPEC 1.008 04/26/2013 1236   PHURINE 7.5 04/26/2013 1236   GLUCOSEU NEGATIVE 04/26/2013 1236   HGBUR NEGATIVE 04/26/2013 1236   BILIRUBINUR NEGATIVE 04/26/2013 1236   KETONESUR NEGATIVE 04/26/2013 1236   PROTEINUR NEGATIVE 04/26/2013 1236   UROBILINOGEN 0.2 04/26/2013 1236   NITRITE NEGATIVE 04/26/2013 1236   LEUKOCYTESUR TRACE (A) 04/26/2013 1236   Sepsis Labs: @LABRCNTIP (procalcitonin:4,lacticidven:4) ) Recent Results (from the past 240 hour(s))  Blood Culture (routine x 2)     Status: None (Preliminary result)   Collection Time: 11/17/19  3:30 PM   Specimen: BLOOD RIGHT WRIST  Result Value Ref Range Status   Specimen Description BLOOD RIGHT WRIST  Final   Special Requests   Final    BOTTLES DRAWN AEROBIC AND ANAEROBIC Blood Culture results may not be optimal due to an inadequate volume of blood received in culture bottles   Culture   Final    NO GROWTH < 24 HOURS Performed at [facility], [address] [place], [place] [address]    Report Status PENDING  Incomplete  Respiratory Panel by RT PCR (Flu A&B, Covid) - Nasopharyngeal Swab     Status: None   Collection Time: 11/17/19  8:01 PM   Specimen: Nasopharyngeal Swab  Result Value Ref Range Status   SARS Coronavirus 2 by  RT PCR NEGATIVE NEGATIVE Final    Comment: (NOTE) SARS-CoV-2 target nucleic acids are NOT DETECTED. The SARS-CoV-2 RNA is generally detectable in upper respiratoy specimens during the acute phase of infection. The lowest concentration of SARS-CoV-2 viral copies this assay can detect is 131 copies/mL. A negative result does not preclude SARS-Cov-2 infection and should not be used as the sole basis for treatment or other patient management decisions. A negative result may occur with  improper specimen collection/handling, submission of specimen other than nasopharyngeal swab, presence of viral mutation(s) within the areas targeted by this assay, and inadequate number of viral copies (<131 copies/mL). A negative result must be combined with clinical observations, patient history, and epidemiological information. The expected result is Negative. Fact Sheet for Patients:  PinkCheek.be Fact Sheet for Healthcare Providers:  GravelBags.it This test is not yet ap proved or cleared by the [place] FDA and  has been authorized for detection and/or diagnosis of SARS-CoV-2 by FDA under an Emergency Use Authorization (EUA). This EUA will remain  in  effect (meaning this test can be used) for the duration of the COVID-19 declaration under Section 564(b)(1) of the Act, 21 U.S.C. section 360bbb-3(b)(1), unless the authorization is terminated or revoked sooner.    Influenza A by PCR NEGATIVE NEGATIVE Final   Influenza B by PCR NEGATIVE NEGATIVE Final    Comment: (NOTE) The Xpert Xpress SARS-CoV-2/FLU/RSV assay is intended as an aid in  the diagnosis of influenza from Nasopharyngeal swab specimens and  should not be used as a sole basis for treatment. Nasal washings and  aspirates are unacceptable for Xpert Xpress SARS-CoV-2/FLU/RSV  testing. Fact Sheet for Patients: PinkCheek.be Fact Sheet for Healthcare  Providers: GravelBags.it This test is not yet approved or cleared by the [place] FDA and  has been authorized for detection and/or diagnosis of SARS-CoV-2 by  FDA under an Emergency Use Authorization (EUA). This EUA will remain  in effect (meaning this test can be used) for the duration of the  Covid-19 declaration under Section 564(b)(1) of the Act, 21  U.S.C. section 360bbb-3(b)(1), unless the authorization is  terminated or revoked. Performed at [facility], [address], [place], [place] [address]      Scheduled Meds: Continuous Infusions: . sodium chloride    . sodium chloride      Procedures/Studies: CT HEAD WO CONTRAST  Result Date: 11/17/2019 CLINICAL DATA:  Unwitnessed fall. Found down. History of dementia and stroke. EXAM: CT HEAD WITHOUT CONTRAST CT CERVICAL SPINE WITHOUT CONTRAST TECHNIQUE: Multidetector CT imaging of the head and cervical spine was performed following the standard protocol without intravenous contrast. Multiplanar CT image reconstructions of the cervical spine were also generated. COMPARISON:  CT head 07/08/2019. FINDINGS: CT HEAD FINDINGS Brain: There is no evidence of acute intracranial hemorrhage, mass lesion, brain edema or extra-axial fluid collection. Stable mild atrophy with mild prominence of the ventricles and subarachnoid spaces for age. Mild chronic small vessel ischemic changes in the periventricular white matter. There is no CT evidence of acute cortical infarction. Vascular: Prominent intracranial vascular calcifications. No hyperdense vessel identified. Skull: Negative for fracture or focal lesion. Sinuses/Orbits: Chronic mucosal thickening in the sphenoid sinus. The additional paranasal sinuses, mastoid air cells and middle ears are clear. No significant orbital findings. Other: None. CT CERVICAL SPINE FINDINGS Alignment: There is a degenerative anterolisthesis at the C2-3, C3-4 and C7-T1 levels. Skull base and  vertebrae: No evidence of acute fracture or traumatic subluxation. Soft tissues and spinal canal: No prevertebral fluid or swelling. No visible canal hematoma. Disc levels: There is multilevel cervical spondylosis, most advanced at the C4-5, C5-6 and C6-7 levels where there are posterior osteophytes contributing to mild foraminal narrowing bilaterally. Asymmetric facet hypertrophy is noted on the left at C3-4, contributing to moderate left foraminal narrowing. Upper chest: Biapical pleuroparenchymal scarring. Right thyroid nodularity is noted, measuring less than 1.5 cm in diameter, unlikely to be clinically significant given the patient's age. No followup imaging recommended (ref: J Am Coll Radiol. 2015 Feb;12(2): 143-50). Other: None. IMPRESSION: 1. No acute intracranial or calvarial findings. Stable mild atrophy and chronic small vessel ischemic changes. 2. No evidence of acute cervical spine fracture, traumatic subluxation or static signs of instability. 3. Multilevel cervical spondylosis as described. Electronically Signed   By: Richardean Sale M.D.   On: 11/17/2019 16:30   CT CHEST W CONTRAST  Result Date: 11/17/2019 CLINICAL DATA:  86 year old female with history of abdominal trauma from an unwitnessed fall. EXAM: CT CHEST, ABDOMEN, AND PELVIS WITH CONTRAST TECHNIQUE: Multidetector CT imaging of the  chest, abdomen and pelvis was performed following the standard protocol during bolus administration of intravenous contrast. CONTRAST:  18mL OMNIPAQUE IOHEXOL 300 MG/ML  SOLN COMPARISON:  No priors. FINDINGS: CT CHEST FINDINGS Cardiovascular: Heart size is mildly enlarged. There is no significant pericardial fluid, thickening or pericardial calcification. There is aortic atherosclerosis, as well as atherosclerosis of the great vessels of the mediastinum and the coronary arteries, including calcified atherosclerotic plaque in the left main, left anterior descending, left circumflex and right coronary arteries.  Status post median sternotomy for CABG including LIMA to the LAD. Mediastinum/Nodes: No posttraumatic hematoma identified in the mediastinum. No pathologically enlarged mediastinal or hilar lymph nodes. Esophagus is unremarkable in appearance. No axillary lymphadenopathy. Lungs/Pleura: Small bilateral pleural effusions (right greater than left). Mild ground-glass attenuation and interlobular septal thickening in the lungs bilaterally, suggesting a background of mild interstitial pulmonary edema. 8 x 4 mm (mean diameter 6 mm) pulmonary nodule in the superior segment of the left lower lobe (axial image 55 of series 4). No other larger more suspicious appearing pulmonary nodules or masses are noted. Musculoskeletal: No acute displaced fractures or aggressive appearing lytic or blastic lesions are noted in the visualized portions of the skeleton. Status post median sternotomy. CT ABDOMEN PELVIS FINDINGS Hepatobiliary: No signs of significant acute traumatic injury to the liver. No suspicious cystic or solid hepatic lesions. Status post cholecystectomy. No intrahepatic biliary ductal dilatation. Common bile duct is dilated measuring 8 mm in the porta hepatis, within normal limits for this post cholecystectomy patient. Pancreas: No signs of significant acute traumatic injury to the pancreas. No pancreatic mass. No pancreatic ductal dilatation. No pancreatic or peripancreatic fluid collections or inflammatory changes. Spleen: No signs of acute traumatic injury to the spleen. Adrenals/Urinary Tract: No signs of acute traumatic injury to either kidney or adrenal gland. Severe atrophy of the left kidney. Right kidney is normal in appearance. Thickening of the adrenal glands bilaterally, likely reflective of adrenal hyperplasia. No hydroureteronephrosis. Urinary bladder is nearly completely decompressed, but appears intact and is otherwise unremarkable in appearance. Stomach/Bowel: No evidence of significant acute traumatic  injury to the hollow viscera. Normal appearance of the stomach. No pathologic dilatation of small bowel or colon. Large amount of stool in the rectal vault which may indicate constipation. A few scattered colonic diverticulae are noted, most evident in the sigmoid colon, without surrounding inflammatory changes to suggest an acute diverticulitis at this time. The appendix is not confidently identified and may be surgically absent. Regardless, there are no inflammatory changes noted adjacent to the cecum to suggest the presence of an acute appendicitis at this time. Vascular/Lymphatic: No evidence of significant acute traumatic injury to the abdominal aorta or the major arteries or veins of the abdomen or pelvis. Extensive aortic atherosclerosis with fusiform ectasia of the infrarenal abdominal aorta which measures up to 2.3 x 2.0 cm. No lymphadenopathy noted in the abdomen or pelvis. Reproductive: Status post hysterectomy. Ovaries are not confidently identified may be surgically absent or atrophic. Other: No high attenuation fluid collection within the peritoneal cavity or retroperitoneum to suggest significant posttraumatic hemorrhage. No significant volume of ascites. No pneumoperitoneum. Musculoskeletal: There are no acute displaced fractures or aggressive appearing lytic or blastic lesions noted in the visualized portions of the skeleton. IMPRESSION: 1. No evidence of significant acute traumatic injury to the chest, abdomen or pelvis. 2. Cardiomegaly with small bilateral pleural effusions and probable interstitial pulmonary edema; imaging findings suggestive of congestive heart failure. 3. Aortic atherosclerosis, in addition to left  main and 3 vessel coronary artery disease. There is also fusiform ectasia of the infrarenal abdominal aorta which measures up to 2.3 x 2.0 cm in diameter. 4. Small pulmonary nodule with a mean diameter of 6 mm in the superior segment of the left lower lobe. This is nonspecific, but  is subpleural in location and statistically likely a subpleural lymph node. Repeat noncontrast chest CT could be considered in 1 year to re-evaluate this finding, depending on the patient's clinical situation (this is considered optional given the patient's advanced age and high likelihood of a benign lesion). 5. Large volume of stool in the rectal vault which may suggest constipation or fecal impaction. 6. Mild colonic diverticulosis without evidence of acute diverticulitis at this time. 7. Additional incidental findings, as above. Electronically Signed   By: Vinnie Langton M.D.   On: 11/17/2019 16:34   CT CERVICAL SPINE WO CONTRAST  Result Date: 11/17/2019 CLINICAL DATA:  Unwitnessed fall. Found down. History of dementia and stroke. EXAM: CT HEAD WITHOUT CONTRAST CT CERVICAL SPINE WITHOUT CONTRAST TECHNIQUE: Multidetector CT imaging of the head and cervical spine was performed following the standard protocol without intravenous contrast. Multiplanar CT image reconstructions of the cervical spine were also generated. COMPARISON:  CT head 07/08/2019. FINDINGS: CT HEAD FINDINGS Brain: There is no evidence of acute intracranial hemorrhage, mass lesion, brain edema or extra-axial fluid collection. Stable mild atrophy with mild prominence of the ventricles and subarachnoid spaces for age. Mild chronic small vessel ischemic changes in the periventricular white matter. There is no CT evidence of acute cortical infarction. Vascular: Prominent intracranial vascular calcifications. No hyperdense vessel identified. Skull: Negative for fracture or focal lesion. Sinuses/Orbits: Chronic mucosal thickening in the sphenoid sinus. The additional paranasal sinuses, mastoid air cells and middle ears are clear. No significant orbital findings. Other: None. CT CERVICAL SPINE FINDINGS Alignment: There is a degenerative anterolisthesis at the C2-3, C3-4 and C7-T1 levels. Skull base and vertebrae: No evidence of acute fracture or  traumatic subluxation. Soft tissues and spinal canal: No prevertebral fluid or swelling. No visible canal hematoma. Disc levels: There is multilevel cervical spondylosis, most advanced at the C4-5, C5-6 and C6-7 levels where there are posterior osteophytes contributing to mild foraminal narrowing bilaterally. Asymmetric facet hypertrophy is noted on the left at C3-4, contributing to moderate left foraminal narrowing. Upper chest: Biapical pleuroparenchymal scarring. Right thyroid nodularity is noted, measuring less than 1.5 cm in diameter, unlikely to be clinically significant given the patient's age. No followup imaging recommended (ref: J Am Coll Radiol. 2015 Feb;12(2): 143-50). Other: None. IMPRESSION: 1. No acute intracranial or calvarial findings. Stable mild atrophy and chronic small vessel ischemic changes. 2. No evidence of acute cervical spine fracture, traumatic subluxation or static signs of instability. 3. Multilevel cervical spondylosis as described. Electronically Signed   By: Richardean Sale M.D.   On: 11/17/2019 16:30   CT ABDOMEN PELVIS W CONTRAST  Result Date: 11/17/2019 CLINICAL DATA:  86 year old female with history of abdominal trauma from an unwitnessed fall. EXAM: CT CHEST, ABDOMEN, AND PELVIS WITH CONTRAST TECHNIQUE: Multidetector CT imaging of the chest, abdomen and pelvis was performed following the standard protocol during bolus administration of intravenous contrast. CONTRAST:  12mL OMNIPAQUE IOHEXOL 300 MG/ML  SOLN COMPARISON:  No priors. FINDINGS: CT CHEST FINDINGS Cardiovascular: Heart size is mildly enlarged. There is no significant pericardial fluid, thickening or pericardial calcification. There is aortic atherosclerosis, as well as atherosclerosis of the great vessels of the mediastinum and the coronary arteries, including calcified  atherosclerotic plaque in the left main, left anterior descending, left circumflex and right coronary arteries. Status post median sternotomy for  CABG including LIMA to the LAD. Mediastinum/Nodes: No posttraumatic hematoma identified in the mediastinum. No pathologically enlarged mediastinal or hilar lymph nodes. Esophagus is unremarkable in appearance. No axillary lymphadenopathy. Lungs/Pleura: Small bilateral pleural effusions (right greater than left). Mild ground-glass attenuation and interlobular septal thickening in the lungs bilaterally, suggesting a background of mild interstitial pulmonary edema. 8 x 4 mm (mean diameter 6 mm) pulmonary nodule in the superior segment of the left lower lobe (axial image 55 of series 4). No other larger more suspicious appearing pulmonary nodules or masses are noted. Musculoskeletal: No acute displaced fractures or aggressive appearing lytic or blastic lesions are noted in the visualized portions of the skeleton. Status post median sternotomy. CT ABDOMEN PELVIS FINDINGS Hepatobiliary: No signs of significant acute traumatic injury to the liver. No suspicious cystic or solid hepatic lesions. Status post cholecystectomy. No intrahepatic biliary ductal dilatation. Common bile duct is dilated measuring 8 mm in the porta hepatis, within normal limits for this post cholecystectomy patient. Pancreas: No signs of significant acute traumatic injury to the pancreas. No pancreatic mass. No pancreatic ductal dilatation. No pancreatic or peripancreatic fluid collections or inflammatory changes. Spleen: No signs of acute traumatic injury to the spleen. Adrenals/Urinary Tract: No signs of acute traumatic injury to either kidney or adrenal gland. Severe atrophy of the left kidney. Right kidney is normal in appearance. Thickening of the adrenal glands bilaterally, likely reflective of adrenal hyperplasia. No hydroureteronephrosis. Urinary bladder is nearly completely decompressed, but appears intact and is otherwise unremarkable in appearance. Stomach/Bowel: No evidence of significant acute traumatic injury to the hollow viscera. Normal  appearance of the stomach. No pathologic dilatation of small bowel or colon. Large amount of stool in the rectal vault which may indicate constipation. A few scattered colonic diverticulae are noted, most evident in the sigmoid colon, without surrounding inflammatory changes to suggest an acute diverticulitis at this time. The appendix is not confidently identified and may be surgically absent. Regardless, there are no inflammatory changes noted adjacent to the cecum to suggest the presence of an acute appendicitis at this time. Vascular/Lymphatic: No evidence of significant acute traumatic injury to the abdominal aorta or the major arteries or veins of the abdomen or pelvis. Extensive aortic atherosclerosis with fusiform ectasia of the infrarenal abdominal aorta which measures up to 2.3 x 2.0 cm. No lymphadenopathy noted in the abdomen or pelvis. Reproductive: Status post hysterectomy. Ovaries are not confidently identified may be surgically absent or atrophic. Other: No high attenuation fluid collection within the peritoneal cavity or retroperitoneum to suggest significant posttraumatic hemorrhage. No significant volume of ascites. No pneumoperitoneum. Musculoskeletal: There are no acute displaced fractures or aggressive appearing lytic or blastic lesions noted in the visualized portions of the skeleton. IMPRESSION: 1. No evidence of significant acute traumatic injury to the chest, abdomen or pelvis. 2. Cardiomegaly with small bilateral pleural effusions and probable interstitial pulmonary edema; imaging findings suggestive of congestive heart failure. 3. Aortic atherosclerosis, in addition to left main and 3 vessel coronary artery disease. There is also fusiform ectasia of the infrarenal abdominal aorta which measures up to 2.3 x 2.0 cm in diameter. 4. Small pulmonary nodule with a mean diameter of 6 mm in the superior segment of the left lower lobe. This is nonspecific, but is subpleural in location and  statistically likely a subpleural lymph node. Repeat noncontrast chest CT could be considered  in 1 year to re-evaluate this finding, depending on the patient's clinical situation (this is considered optional given the patient's advanced age and high likelihood of a benign lesion). 5. Large volume of stool in the rectal vault which may suggest constipation or fecal impaction. 6. Mild colonic diverticulosis without evidence of acute diverticulitis at this time. 7. Additional incidental findings, as above. Electronically Signed   By: Vinnie Langton M.D.   On: 11/17/2019 16:34   DG Pelvis Portable  Result Date: 11/17/2019 CLINICAL DATA:  Un witnessed fall. Found on the floor. Complaining of left arm and bilateral leg pain. Patient has dementia. EXAM: PORTABLE PELVIS 1-2 VIEWS COMPARISON:  None. FINDINGS: No convincing fracture.  No bone lesion. Hip joints, SI joints and symphysis pubis are normally spaced and aligned. Skeletal structures are demineralized. Soft tissues are unremarkable. Visualized colon shows increased colonic stool burden. IMPRESSION: No fracture or dislocation Electronically Signed   By: Lajean Manes M.D.   On: 11/17/2019 14:01   DG Chest Port 1 View  Result Date: 11/17/2019 CLINICAL DATA:  unwitnessed fall. She was found in the floor "halfway under the bed." Patient complaining of pain to left arm and bilateral legs but patient has dementia. Also complaining of back pain. Htn/chf/ex smoker/hx cabg EXAM: PORTABLE CHEST 1 VIEW COMPARISON:  07/11/2019. FINDINGS: Hazy bilateral airspace opacities are noted, similar to the prior radiographs. Pattern is concerning for multifocal pneumonia. The opacities are new from a chest radiograph from 05/17/2016. There changes from cardiac surgery. Right coronary artery stent is noted. Cardiac silhouette top-normal in size to slightly enlarged. No mediastinal or hilar masses. No convincing pleural effusion.  No pneumothorax. Skeletal structures are grossly  intact. IMPRESSION: 1. Hazy bilateral airspace opacities are noted in a pattern suspicious for multifocal pneumonia. However, the appearance is similar to the most recent prior chest radiographs. Findings could potentially be due to asymmetric pulmonary edema instead of infection. 2. 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 Electronically Signed   By: Lajean Manes M.D.   On: 11/17/2019 14:00   ECHOCARDIOGRAM COMPLETE  Result Date: 11/18/2019   ECHOCARDIOGRAM REPORT   Patient Name:   BRYANA BOXER Date of Exam: 11/18/2019 Medical Rec #:  [id number]        Height:       62.0 in Accession #:    [id number]       Weight:       90.0 lb Date of Birth:  [birth year]        BSA:          1.36 m Patient Age:    79 years         BP:           96/42 mmHg Patient Gender: F                HR:           70 bpm. Exam Location:  [facility] Procedure: 2D Echo, Cardiac Doppler and Color Doppler Indications:    Elevated Troponin  History:        Patient has prior history of Echocardiogram examinations, most                 recent 12/10/2015. CAD, Stroke, Arrythmias:Atrial Fibrillation;                 Risk Factors:Hypertension and Dyslipidemia. Resp. failure.  Sonographer:    Dustin Flock RDCS Referring Phys: [place]  1. Left ventricular ejection fraction, by  visual estimation, is 25 to 30%. The left ventricle has moderate to severely decreased function. There is no left ventricular hypertrophy.  2. Abnormal septal motion consistent with left bundle branch block.  3. Elevated left atrial and left ventricular end-diastolic pressures.  4. Left ventricular diastolic parameters are consistent with Grade III diastolic dysfunction (restrictive).  5. The left ventricle demonstrates global hypokinesis.  6. Global right ventricle has low normal systolic function.The right ventricular size is normal. No increase in right ventricular wall thickness.  7. Left atrial size was severely dilated.  8. Right atrial size was normal.   9. The mitral valve is abnormal. Moderate mitral valve regurgitation. 10. The tricuspid valve is grossly normal. 11. The aortic valve is tricuspid. Aortic valve regurgitation is trivial. Mild aortic valve sclerosis without stenosis. 12. The pulmonic valve was grossly normal. Pulmonic valve regurgitation is trivial. 13. Moderately elevated pulmonary artery systolic pressure. 14. The inferior vena cava is normal in size with <50% respiratory variability, suggesting right atrial pressure of 8 mmHg. FINDINGS  Left Ventricle: Left ventricular ejection fraction, by visual estimation, is 25 to 30%. The left ventricle has moderate to severely decreased function. The left ventricle demonstrates global hypokinesis. There is no left ventricular hypertrophy. Abnormal (paradoxical) septal motion, consistent with left bundle branch block. Left ventricular diastolic parameters are consistent with Grade III diastolic dysfunction (restrictive). Elevated left atrial and left ventricular end-diastolic pressures. Right Ventricle: The right ventricular size is normal. No increase in right ventricular wall thickness. Global RV systolic function is has low normal systolic function. The tricuspid regurgitant velocity is 3.09 m/s, and with an assumed right atrial pressure of 8 mmHg, the estimated right ventricular systolic pressure is moderately elevated at 46.1 mmHg. Left Atrium: Left atrial size was severely dilated. Right Atrium: Right atrial size was normal in size Pericardium: There is no evidence of pericardial effusion. Mitral Valve: The mitral valve is abnormal. There is mild thickening of the mitral valve leaflet(s). Moderate mitral valve regurgitation. Tricuspid Valve: The tricuspid valve is grossly normal. Tricuspid valve regurgitation mild-moderate. Aortic Valve: The aortic valve is tricuspid. Aortic valve regurgitation is trivial. Aortic regurgitation PHT measures 465 msec. Mild aortic valve sclerosis is present, with no  evidence of aortic valve stenosis. Pulmonic Valve: The pulmonic valve was grossly normal. Pulmonic valve regurgitation is trivial. Pulmonic regurgitation is trivial. Aorta: The aortic root and ascending aorta are structurally normal, with no evidence of dilitation. Venous: The inferior vena cava is normal in size with less than 50% respiratory variability, suggesting right atrial pressure of 8 mmHg. IAS/Shunts: No atrial level shunt detected by color flow Doppler.  LEFT VENTRICLE PLAX 2D LVIDd:         5.29 cm  Diastology LVIDs:         4.45 cm  LV e' lateral:   5.70 cm/s LV PW:         1.03 cm  LV E/e' lateral: 18.4 LV IVS:        0.84 cm  LV e' medial:    4.05 cm/s LVOT diam:     2.00 cm  LV E/e' medial:  25.9 LV SV:         45 ml LV SV Index:   33.73 LVOT Area:     3.14 cm  RIGHT VENTRICLE RV Basal diam:  2.18 cm RV S prime:     3.71 cm/s TAPSE (M-mode): 1.5 cm LEFT ATRIUM             Index  RIGHT ATRIUM           Index LA diam:        4.10 cm 3.01 cm/m  RA Area:     16.00 cm LA Vol (A2C):   80.1 ml 58.83 ml/m RA Volume:   44.50 ml  32.68 ml/m LA Vol (A4C):   73.9 ml 54.28 ml/m LA Biplane Vol: 79.3 ml 58.24 ml/m  AORTIC VALVE LVOT Vmax:   110.00 cm/s LVOT Vmean:  71.900 cm/s LVOT VTI:    0.209 m AI PHT:      465 msec  AORTA Ao Root diam: 2.80 cm MITRAL VALVE                         TRICUSPID VALVE MV Area (PHT): 4.31 cm              TR Peak grad:   38.1 mmHg MV PHT:        51.04 msec            TR Vmax:        333.00 cm/s MV Decel Time: 176 msec MV E velocity: 105.00 cm/s 103 cm/s  SHUNTS MV A velocity: 48.50 cm/s  70.3 cm/s Systemic VTI:  0.21 m MV E/A ratio:  2.16        1.5       Systemic Diam: 2.00 cm  Lyman Bishop MD Electronically signed by Lyman Bishop MD Signature Date/Time: 11/18/2019/4:04:39 PM    Final     Orson Eva, DO  [facility] Pager [phone]  If 7PM-7AM, please contact night-coverage [email or web address] Password [facility] 11/19/2019, 12:37 PM   LOS: 2 days

## 2019-11-20 NOTE — Progress Notes (Signed)
  86 year old female with history of dementia admitted with sepsis secondary to pneumonia, found to have profound anemia, heme positive stool in setting of anticoagulation, but no overt GI bleeding. Hgb improved following 2 units PRBCs. After review of chart, family has decided to focus on comfort care. Referral to [facility] completed yesterday, with hopeful transition to facility today. GI signing off.   Annitta Needs, PhD, ANP-BC [facility]

## 2019-11-20 NOTE — Care Management Important Message (Signed)
Important Message  Patient Details  Name: [name] MRN: [id number] Date of Birth: [birth year]   Medicare Important Message Given:  Yes     Tommy Medal 11/20/2019, 12:17 PM

## 2019-11-21 LAB — BPAM RBC
Blood Product Expiration Date: 202102152359
Blood Product Expiration Date: 202102152359
Blood Product Expiration Date: 202102152359
ISSUE DATE / TIME: 202101151621
ISSUE DATE / TIME: 202101151859
Unit Type and Rh: 6200
Unit Type and Rh: 6200
Unit Type and Rh: 6200

## 2019-11-21 LAB — TYPE AND SCREEN
ABO/RH(D): A POS
Antibody Screen: NEGATIVE
Unit division: 0
Unit division: 0
Unit division: 0

## 2019-11-22 LAB — CULTURE, BLOOD (ROUTINE X 2): Culture: NO GROWTH

## 2019-12-04 DEATH — deceased

## 2020-07-20 IMAGING — CT CT HEAD W/O CM
3 series · 14 of 47 positions shown, 16 images · non-contrast
Comparison: CT head 07/08/2019.

CLINICAL DATA: Unwitnessed fall. Found down. History of dementia
and stroke.

EXAM:
CT HEAD WITHOUT CONTRAST
CT CERVICAL SPINE WITHOUT CONTRAST
TECHNIQUE: Multidetector CT imaging of the head and cervical spine was
performed following the standard protocol without intravenous
contrast. Multiplanar CT image reconstructions of the cervical spine
were also generated.

[Series 2: head w o · axial · 0.45mm/px · z∈[+58,+183]mm · 8 of 31 slices shown, 10 images]
[im 3/31  brain]
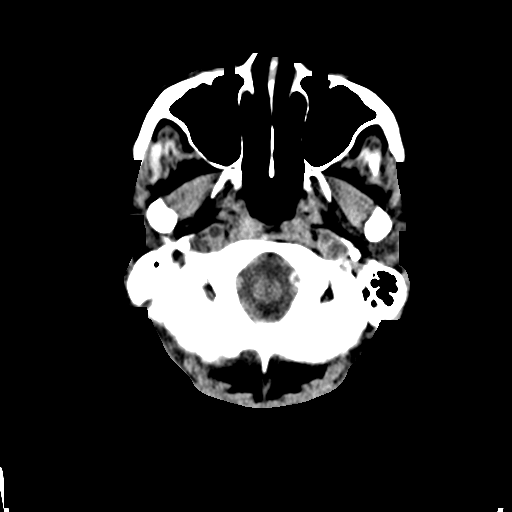
[im 3/31  bone]
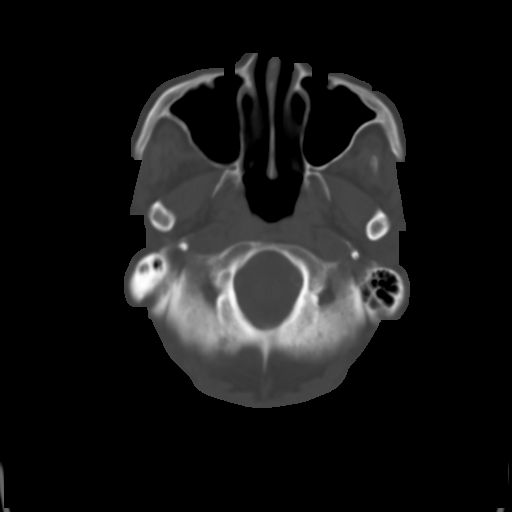
[im 7/31  brain]
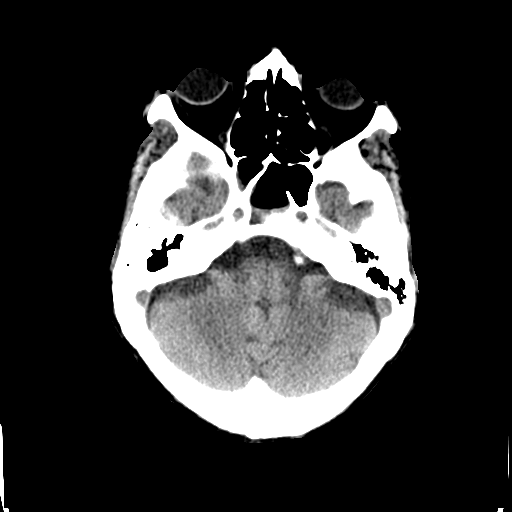
[im 10/31  brain]
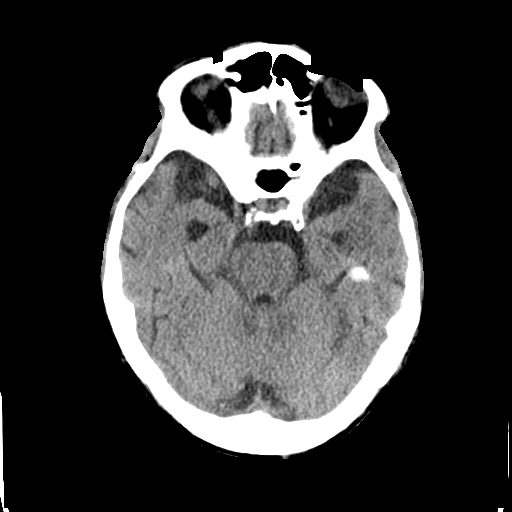
[im 14/31  brain]
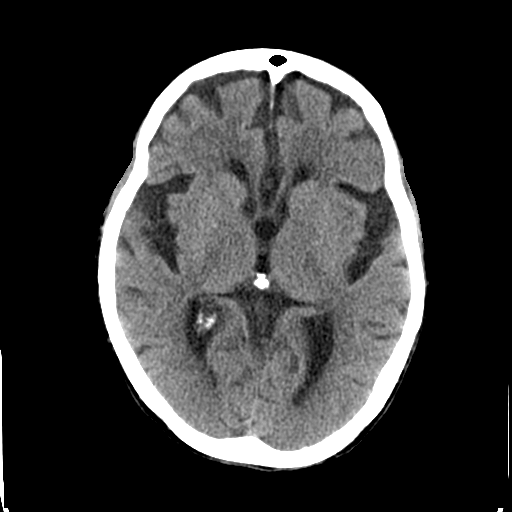
[im 17/31  brain]
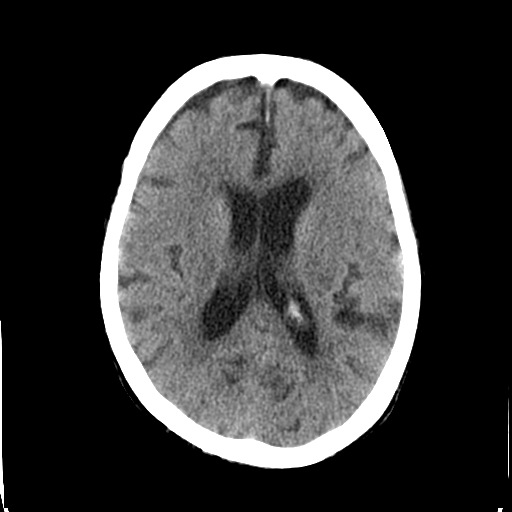
[im 17/31  bone]
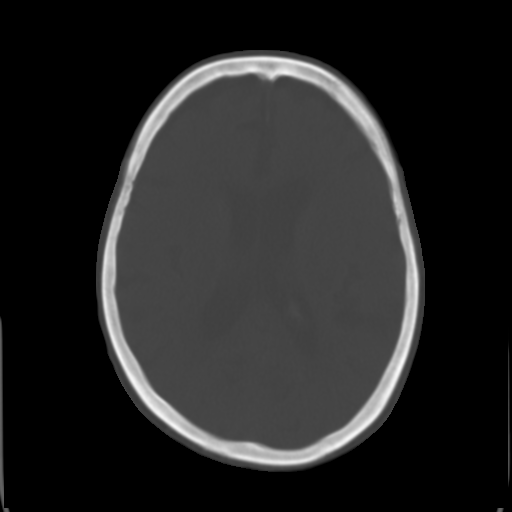
[im 21/31  brain]
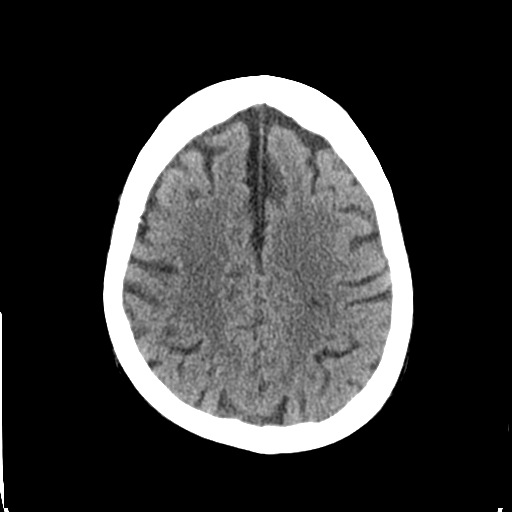
[im 24/31  brain]
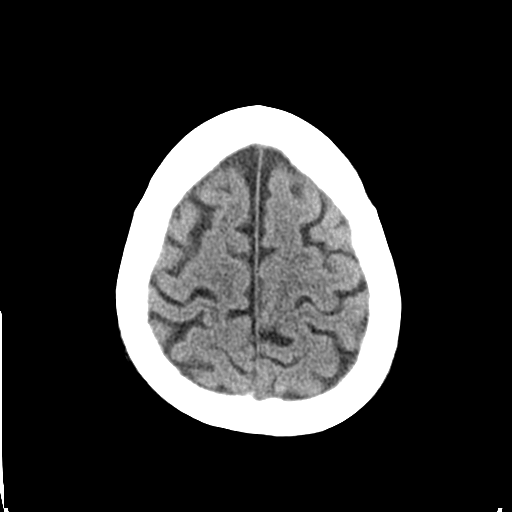
[im 28/31  brain]
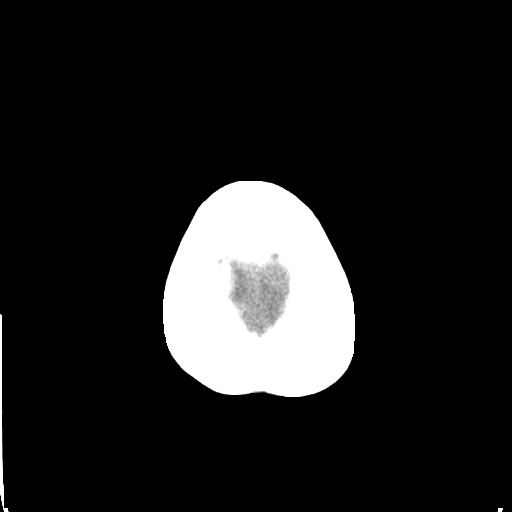

[Series 4: coronal soft · coronal · 0.34mm/px · 3 of 68 slices shown]
[im 23/68  brain]
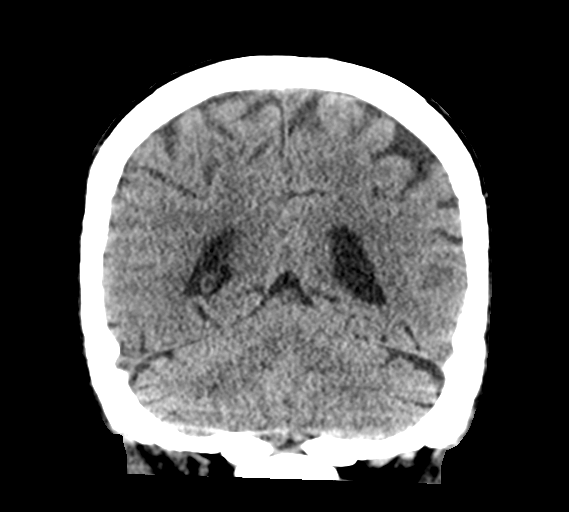
[im 30/68  brain]
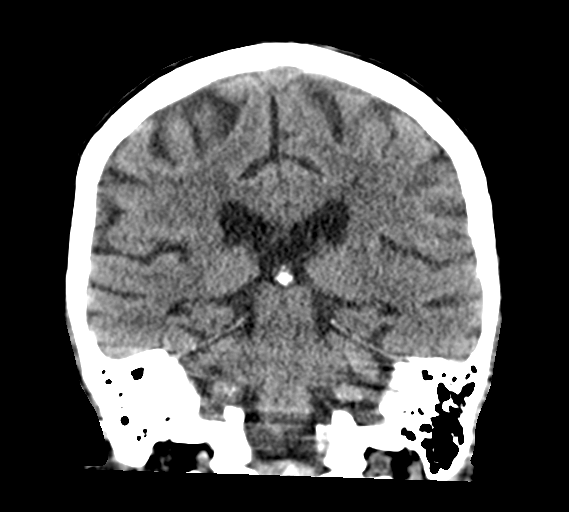
[im 38/68  brain]
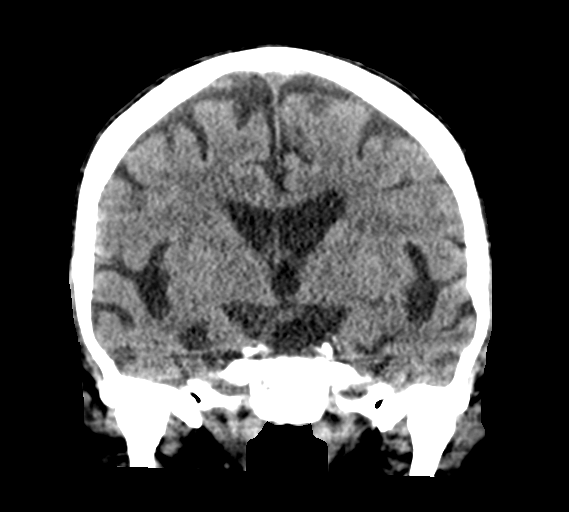

[Series 5: sagittal soft · sagittal · 0.32mm/px · 3 of 55 slices shown]
[im 19/55  brain]
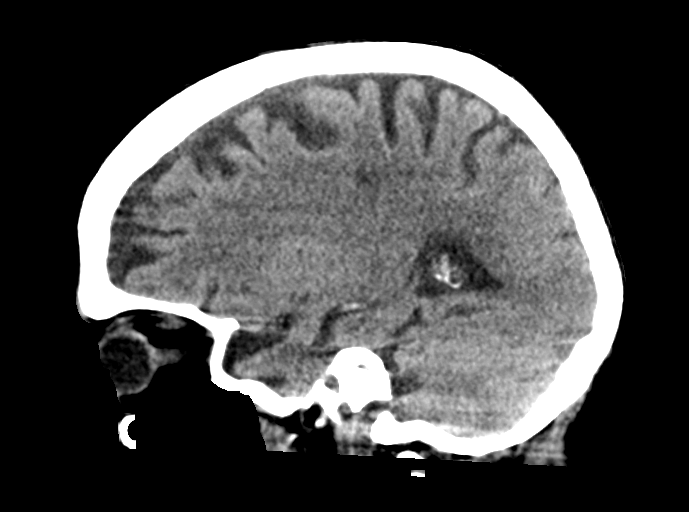
[im 28/55  brain]
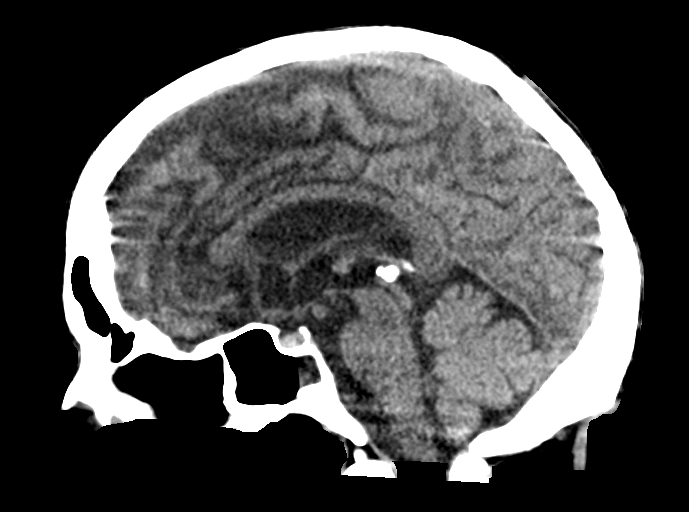
[im 37/55  brain]
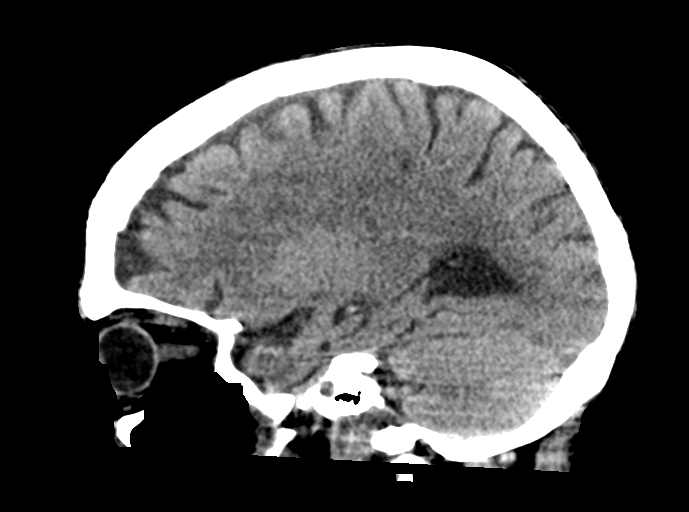

[14 of 47 positions shown; findings below may reference images not displayed]

FINDINGS: CT HEAD FINDINGS

Brain: There is no evidence of acute intracranial hemorrhage, mass
lesion, brain edema or extra-axial fluid collection. Stable mild
atrophy with mild prominence of the ventricles and subarachnoid
spaces for age. Mild chronic small vessel ischemic changes in the
periventricular white matter. There is no CT evidence of acute
cortical infarction.

Vascular: Prominent intracranial vascular calcifications. No
hyperdense vessel identified.

Skull: Negative for fracture or focal lesion.

Sinuses/Orbits: Chronic mucosal thickening in the sphenoid sinus.
The additional paranasal sinuses, mastoid air cells and middle ears
are clear. No significant orbital findings.

Other: None.

CT CERVICAL SPINE FINDINGS

Alignment: There is a degenerative anterolisthesis at the C2-3, C3-4
and C7-T1 levels.

Skull base and vertebrae: No evidence of acute fracture or traumatic
subluxation.

Soft tissues and spinal canal: No prevertebral fluid or swelling. No
visible canal hematoma.

Disc levels: There is multilevel cervical spondylosis, most advanced
at the C4-5, C5-6 and C6-7 levels where there are posterior
osteophytes contributing to mild foraminal narrowing bilaterally.
Asymmetric facet hypertrophy is noted on the left at C3-4,
contributing to moderate left foraminal narrowing.

Upper chest: Biapical pleuroparenchymal scarring. Right thyroid
nodularity is noted, measuring less than 1.5 cm in diameter,
unlikely to be clinically significant given the patient's age. No
followup imaging recommended (ref: [HOSPITAL]. [DATE]):

Other: None.
IMPRESSION: 1. No acute intracranial or calvarial findings. Stable mild atrophy
and chronic small vessel ischemic changes.
2. No evidence of acute cervical spine fracture, traumatic
subluxation or static signs of instability.
3. Multilevel cervical spondylosis as described.
